# Patient Record
Sex: Male | Born: 1935 | Race: White | Hispanic: No | Marital: Married | Smoking: Former smoker
Health system: Southern US, Community
[De-identification: ages and names within clinical notes are randomized; demographics above are authoritative.]

## PROBLEM LIST (undated history)

## (undated) DIAGNOSIS — J449 Chronic obstructive pulmonary disease, unspecified: Secondary | ICD-10-CM

## (undated) DIAGNOSIS — N400 Enlarged prostate without lower urinary tract symptoms: Secondary | ICD-10-CM

## (undated) DIAGNOSIS — H3530 Unspecified macular degeneration: Secondary | ICD-10-CM

## (undated) DIAGNOSIS — K219 Gastro-esophageal reflux disease without esophagitis: Secondary | ICD-10-CM

## (undated) DIAGNOSIS — J4489 Other specified chronic obstructive pulmonary disease: Secondary | ICD-10-CM

## (undated) DIAGNOSIS — J309 Allergic rhinitis, unspecified: Secondary | ICD-10-CM

## (undated) HISTORY — DX: Chronic obstructive pulmonary disease, unspecified: J44.9

## (undated) HISTORY — DX: Unspecified macular degeneration: H35.30

## (undated) HISTORY — DX: Gastro-esophageal reflux disease without esophagitis: K21.9

## (undated) HISTORY — DX: Benign prostatic hyperplasia without lower urinary tract symptoms: N40.0

## (undated) HISTORY — PX: TURP VAPORIZATION: SUR1397

## (undated) HISTORY — DX: Allergic rhinitis, unspecified: J30.9

## (undated) HISTORY — DX: Other specified chronic obstructive pulmonary disease: J44.89

## (undated) HISTORY — PX: VASECTOMY: SHX75

---

## 2000-01-13 ENCOUNTER — Encounter: Admission: RE | Admit: 2000-01-13 | Discharge: 2000-01-13 | Payer: Self-pay | Admitting: Cardiology

## 2000-01-13 ENCOUNTER — Encounter: Payer: Self-pay | Admitting: Cardiology

## 2000-01-20 ENCOUNTER — Ambulatory Visit (HOSPITAL_COMMUNITY): Admission: RE | Admit: 2000-01-20 | Discharge: 2000-01-20 | Payer: Self-pay | Admitting: Cardiology

## 2002-04-28 ENCOUNTER — Ambulatory Visit (HOSPITAL_BASED_OUTPATIENT_CLINIC_OR_DEPARTMENT_OTHER): Admission: RE | Admit: 2002-04-28 | Discharge: 2002-04-28 | Payer: Self-pay | Admitting: Internal Medicine

## 2002-05-24 ENCOUNTER — Encounter: Payer: Self-pay | Admitting: Internal Medicine

## 2002-05-24 ENCOUNTER — Ambulatory Visit (HOSPITAL_COMMUNITY): Admission: RE | Admit: 2002-05-24 | Discharge: 2002-05-24 | Payer: Self-pay | Admitting: Internal Medicine

## 2004-09-03 ENCOUNTER — Encounter: Admission: RE | Admit: 2004-09-03 | Discharge: 2004-09-03 | Payer: Self-pay | Admitting: Family Medicine

## 2004-09-09 ENCOUNTER — Encounter: Admission: RE | Admit: 2004-09-09 | Discharge: 2004-09-09 | Payer: Self-pay | Admitting: Family Medicine

## 2005-08-18 ENCOUNTER — Ambulatory Visit: Payer: Self-pay | Admitting: Internal Medicine

## 2006-01-13 ENCOUNTER — Ambulatory Visit: Payer: Self-pay | Admitting: Internal Medicine

## 2006-07-15 ENCOUNTER — Ambulatory Visit: Payer: Self-pay | Admitting: Internal Medicine

## 2007-01-13 ENCOUNTER — Ambulatory Visit: Payer: Self-pay | Admitting: Internal Medicine

## 2008-01-12 DIAGNOSIS — K219 Gastro-esophageal reflux disease without esophagitis: Secondary | ICD-10-CM

## 2008-01-12 DIAGNOSIS — J310 Chronic rhinitis: Secondary | ICD-10-CM

## 2008-01-12 DIAGNOSIS — J45909 Unspecified asthma, uncomplicated: Secondary | ICD-10-CM | POA: Insufficient documentation

## 2008-01-13 ENCOUNTER — Ambulatory Visit: Payer: Self-pay | Admitting: Internal Medicine

## 2008-01-13 DIAGNOSIS — Z8719 Personal history of other diseases of the digestive system: Secondary | ICD-10-CM

## 2008-01-16 DIAGNOSIS — J449 Chronic obstructive pulmonary disease, unspecified: Secondary | ICD-10-CM

## 2008-01-16 DIAGNOSIS — J309 Allergic rhinitis, unspecified: Secondary | ICD-10-CM | POA: Insufficient documentation

## 2008-01-19 ENCOUNTER — Ambulatory Visit: Payer: Self-pay | Admitting: Internal Medicine

## 2008-12-19 ENCOUNTER — Encounter: Admission: RE | Admit: 2008-12-19 | Discharge: 2008-12-19 | Payer: Self-pay | Admitting: Family Medicine

## 2009-01-11 ENCOUNTER — Ambulatory Visit: Payer: Self-pay | Admitting: Internal Medicine

## 2009-01-29 ENCOUNTER — Telehealth: Payer: Self-pay | Admitting: Internal Medicine

## 2009-04-12 ENCOUNTER — Ambulatory Visit: Payer: Self-pay | Admitting: Internal Medicine

## 2010-04-04 ENCOUNTER — Inpatient Hospital Stay (HOSPITAL_COMMUNITY): Admission: EM | Admit: 2010-04-04 | Discharge: 2010-04-05 | Payer: Self-pay | Admitting: Emergency Medicine

## 2010-04-12 ENCOUNTER — Telehealth: Payer: Self-pay | Admitting: Internal Medicine

## 2010-04-15 ENCOUNTER — Ambulatory Visit: Payer: Self-pay | Admitting: Internal Medicine

## 2010-05-15 ENCOUNTER — Telehealth: Payer: Self-pay | Admitting: Internal Medicine

## 2010-10-11 ENCOUNTER — Ambulatory Visit: Payer: Self-pay | Admitting: Internal Medicine

## 2010-12-29 ENCOUNTER — Encounter: Payer: Self-pay | Admitting: Family Medicine

## 2011-01-06 ENCOUNTER — Encounter
Admission: RE | Admit: 2011-01-06 | Discharge: 2011-01-06 | Payer: Self-pay | Source: Home / Self Care | Attending: Family Medicine | Admitting: Family Medicine

## 2011-01-07 NOTE — Progress Notes (Signed)
Summary: nos appt  Phone Note Call from Patient   Caller: [name]  Call For: [name] Summary of Call: Rsc nos from 5/5 to 5/9 @ 9:45a. Initial call taken by: [name],  Apr 12, 2010 2:10 PM

## 2011-01-07 NOTE — Progress Notes (Signed)
Summary: rx  Phone Note Call from Patient   Caller: Daughter-Paula Call For: Young Reason for Call: Refill Medication Summary of Call: Advair 100/50 - pt going to mail order needs 90 day supply called in to [facility].  Fax# [phone] Initial call taken by: Eugene Gavia,  May 15, 2010 11:49 AM  Follow-up for Phone Call        refill sent. pt aware.Carron Curie CMA  May 15, 2010 11:54 AM     Prescriptions: ADVAIR DISKUS 100-50 MCG/DOSE MISC (FLUTICASONE-SALMETEROL) 1 puff and rinse, twice daily  #3 x 3   Entered by:   Carron Curie CMA   Authorized by:   Waymon Budge MD   Signed by:   Carron Curie CMA on 05/15/2010   Method used:   Faxed to ...       [facility] (mail-order)       [address]       [place], [place]  [address]       Ph: [phone]       Fax: [phone]   RxID:   [id number] ADVAIR DISKUS 100-50 MCG/DOSE MISC (FLUTICASONE-SALMETEROL) 1 puff and rinse, twice daily  #3 x 3   Entered by:   Carron Curie CMA   Authorized by:   Waymon Budge MD   Signed by:   Carron Curie CMA on 05/15/2010   Method used:   Faxed to ...       [facility] (mail-order)             , [place]         Ph:        Fax: [phone]   RxID:   [id number]

## 2011-01-07 NOTE — Assessment & Plan Note (Signed)
Summary: 1 yr/jd   Primary Provider/Referring Provider:  Silvestre Gunner FP/ Al Little  CC:  Yearly follow up visit.  History of Present Illness: 2008/02/09-  Good  so far this winter with no colds. Meds discussed. Flu shot taken along with shingles shot earlier. No routine bronchodilators. Notices dyspnea on hills and stairs, probably not progressive. Morning cough some clear mucus.Denies blood, admits occ indigestion with no exertional pain. Saw Dr.L ittle years ago for eval of chest pain.  01/11/09- 81 yoM with hx copd, allergic rhinitis, asthma, GERD                     Wife and dtr here Annual f/u/. Reviewed test results from last visit CXR- COPD,NAD PFT- mild obst small airways without response, mod reduction DLCO - 374 meters, nadir 91%.  No longer feels he could walk the distance he could a year ago.Difficult on stairs. Admits he doesn't walk or exercise much any more. Has had several bronchitis episodes this winter and is still coughing. Hx Pneumovax x 2. Sputum is pale green, no blood and no chest pain. Much nasal discharge. Had pneumovax in 1995 and 2005. Wife say his breathing is terrible at night with a rattle in his chest.   04/12/09- Allergic rhinits, hx copd, asthma, GERD     Wife here had cold this winter. Has noted hoarseness intermittently with pollen. Wife says he no longrer snores and labors at night since he got the Advair. They are satisfied overall. Discussed antihistamines  Apr 15, 2010- Allergic rhinitis, hx COPD, asthma, GERD....................Marland Kitchenwife here He blames over-exertion mowing lawn x 4 hours for causing fever. He went to ER and was hopitalised for a few days with pneumonia.  While in hosp they found a tick, but covered by the antibiotics he was given- discharged on Avelox. Not diagnosed RMSF, and he never noted a rash. Breathing is back to his baseline and he is using Advair once daily.     Current Medications (verified): 1)  Multivitamin .... Take 1 Tablet  By Mouth Once A Day 2)  Cardura 4 Mg  Tabs (Doxazosin Mesylate) .... Take 1 Tablet By Mouth Once A Day 3)  Mucinex .... Use As Needed 4)  Tylenol .... Prn 5)  Protonix 40 Mg Tbec (Pantoprazole Sodium) .... Take 1 By Mouth Once Daily 6)  I Cap Vitamin .... Take 1 Tablet By Mouth Twice Daily 7)  Chelated Zinc 50 Mg Tabs (Zinc) .... Take 1/2 By Mouth Once Daily 8)  Vitamin C 500 Mg Tabs (Ascorbic Acid) .... Take 1 By Mouth Once Daily 9)  Advair Diskus 100-50 Mcg/dose Misc (Fluticasone-Salmeterol) .Marland Kitchen.. 1 Puff and Rinse, Twice Daily 10)  Simvastatin 10 Mg Tabs (Simvastatin) .... Take 1/2 By Mouth Once Daily  Allergies (verified): No Known Drug Allergies  Past History:  Past Medical History: Last updated: 02/09/08 Allergic Rhinitis Asthma C O P D G E R D BPH  Past Surgical History: Last updated: 04/12/2009 TURP Vasectomy  Family History: Last updated: February 09, 2008 Parents died of cancer quite elderly  Social History: Last updated: 02/09/08 Patient states former smoker.  Worked Designer, fashion/clothing, farming, insurance  Risk Factors: Smoking Status: quit (02-09-2008)  Review of Systems      See HPI  The patient denies anorexia, fever, weight loss, weight gain, vision loss, decreased hearing, hoarseness, chest pain, syncope, dyspnea on exertion, peripheral edema, prolonged cough, headaches, hemoptysis, abdominal pain, and severe indigestion/heartburn.    Vital Signs:  Patient profile:  77 year old male Height:      66 inches Weight:      147 pounds BMI:     23.81 O2 Sat:      96 % on Room air Pulse rate:   77 / minute BP sitting:   126 / 72  (left arm) Cuff size:   regular  Vitals Entered By: Reynaldo Minium CMA (Apr 15, 2010 9:39 AM)  O2 Flow:  Room air  Physical Exam  Additional Exam:  General: A/Ox3; pleasant and cooperative, NAD, hard of hearing, room air O2 sat 96% SKIN: no rash, lesions NODES: no lymphadenopathy HEENT: [place]/AT, EOM- WNL, Conjunctivae- clear, PERRLA,  TM-hearing aid, Nose- clear, Throat- clear and wnl NECK: Supple w/ fair ROM, JVD- none, normal carotid impulses w/o bruits Thyroid-  CHEST: Distant, unlabored on room air.  HEART: RRR, no m/g/r heard ABDOMEN NWG:NFAO, nl pulses, no edema  NEURO: Grossly intact to observation      Impression & Recommendations:  Problem # 1:  C O P D (ICD-496) He is at baseline now after a pneumonia last month. He uses Advair once daily which seems to be sufficient.  Problem # 2:  ALLERGIC RHINITIS (ICD-477.9) Controlled without acute problem this spring.  Medications Added to Medication List This Visit: 1)  Protonix 40 Mg Tbec (Pantoprazole sodium) .... Take 1 by mouth once daily 2)  Simvastatin 10 Mg Tabs (Simvastatin) .... Take 1/2 by mouth once daily  Other Orders: Est. Patient Level III (13086)  Patient Instructions: 1)  Please schedule a follow-up appointment in 6 months. 2)  Continue present meds- call as needed

## 2011-01-07 NOTE — Assessment & Plan Note (Signed)
Summary: 6 months/ mbw   Primary Provider/Referring Provider:  Silvestre Gunner FP/ Al Little  CC:  6 month-COPD .  History of Present Illness:  04/12/09- Allergic rhinits, hx copd, asthma, GERD     Wife here had cold this winter. Has noted hoarseness intermittently with pollen. Wife says he no longrer snores and labors at night since he got the Advair. They are satisfied overall. Discussed antihistamines  Apr 15, 2010- Allergic rhinitis, hx COPD, asthma, GERD....................Marland Kitchenwife here He blames over-exertion mowing lawn x 4 hours for causing fever. He went to ER and was hopitalised for a few days with pneumonia.  While in hosp they found a tick, but covered by the antibiotics he was given- discharged on Avelox. Not diagnosed RMSF, and he never noted a rash. Breathing is back to his baseline and he is using Advair once daily.  October 11, 2010- Allergic rhinitis, hx COPD, asthma, GERD....................Marland Kitchenwife here Nurse-CC: 6 month-COPD  Had another pneumonia, this time treated at home- just fininshing Avelox and benzonatate. Otherwise he has maintained well with Advair. He has GERD at times and we discussed aspiration and other common pneumonia patterns. Cost of Advair is a concern and we discused alternatives. He rarely notes wheeze.     Asthma History    Initial Asthma Severity Rating:    Age range: 12+ years    Symptoms: 0-2 days/week    Nighttime Awakenings: 0-2/month    Interferes w/ normal activity: no limitations    SABA use (not for EIB): 0-2 days/week    Asthma Severity Assessment: Intermittent   Preventive Screening-Counseling & Management  Alcohol-Tobacco     Smoking Status: quit     Year Quit: 1993     Pack years: 30 yrs  2 pack daily  Current Medications (verified): 1)  Multivitamin .... Take 1 Tablet By Mouth Once A Day 2)  Cardura 4 Mg  Tabs (Doxazosin Mesylate) .... Take 1 Tablet By Mouth Once A Day 3)  Mucinex .... Use As Needed 4)  Tylenol .... Prn 5)   Protonix 40 Mg Tbec (Pantoprazole Sodium) .... Take 1 By Mouth Once Daily 6)  I Cap Vitamin .... Take 1 Tablet By Mouth Twice Daily 7)  Chelated Zinc 50 Mg Tabs (Zinc) .... Take 1/2 By Mouth Once Daily 8)  Vitamin C 500 Mg Tabs (Ascorbic Acid) .... Take 1 By Mouth Once Daily 9)  Advair Diskus 100-50 Mcg/dose Misc (Fluticasone-Salmeterol) .Marland Kitchen.. 1 Puff and Rinse, Twice Daily 10)  Vitamin B-12 1000 Mcg Tabs (Cyanocobalamin) .... Take 1 By Mouth Once Daily  Allergies (verified): No Known Drug Allergies  Past History:  Past Medical History: Last updated: 2008/01/19 Allergic Rhinitis Asthma C O P D G E R D BPH  Past Surgical History: Last updated: 04/12/2009 TURP Vasectomy  Family History: Last updated: 01/19/08 Parents died of cancer quite elderly  Social History: Last updated: 01-19-2008 Patient states former smoker.  Worked Designer, fashion/clothing, farming, insurance  Risk Factors: Smoking Status: quit (10/11/2010)  Review of Systems      See HPI       The patient complains of shortness of breath with activity.  The patient denies shortness of breath at rest, productive cough, non-productive cough, coughing up blood, chest pain, irregular heartbeats, indigestion, loss of appetite, weight change, abdominal pain, difficulty swallowing, sore throat, tooth/dental problems, headaches, nasal congestion/difficulty breathing through nose, and sneezing.    Vital Signs:  Patient profile:   77 year old male Height:      25  inches Weight:      148.38 pounds BMI:     24.04 O2 Sat:      91 % on Room air Pulse rate:   72 / minute BP sitting:   128 / 70  (left arm) Cuff size:   regular  Vitals Entered By: Reynaldo Minium CMA (October 11, 2010 9:16 AM)  O2 Flow:  Room air CC: 6 month-COPD    Physical Exam  Additional Exam:  General: A/Ox3; pleasant and cooperative, NAD, hard of hearing, room air O2 sat 91% SKIN: no rash, lesions NODES: no lymphadenopathy HEENT: [place]/AT, EOM- WNL,  Conjunctivae- clear, PERRLA, TM-hearing aid, Nose- clear, Throat- clear and wnl NECK: Supple w/ fair ROM, JVD- none, normal carotid impulses w/o bruits Thyroid-  CHEST: Distant, unlabored on room air.  HEART: RRR, no m/g/r heard ABDOMEN not obese WGN:FAOZ, nl pulses, no edema  NEURO: Grossly intact to observation      Impression & Recommendations:  Problem # 1:  ASTHMA (ICD-493.90) Asthma control has been good. In order to assess cost effectiveness, we will give scripts for a rescue inhaler and steroid inhaler. They will take these to the drug store for price check against Advair.  He has had more than one pneumovax and doesn't need to repeat that. Had flu vax.   Medications Added to Medication List This Visit: 1)  Vitamin B-12 1000 Mcg Tabs (Cyanocobalamin) .... Take 1 by mouth once daily 2)  Qvar 40 Mcg/act Aers (Beclomethasone dipropionate) .... 2 puffs and rinse mouth, twice daily- steroid maintenance inhaler 3)  Proair Hfa 108 (90 Base) Mcg/act Aers (Albuterol sulfate) .... 2 puffs four times a day as needed rescue inhaler  Other Orders: Est. Patient Level IV (30865)  Patient Instructions: 1)  Please schedule a follow-up appointment in 6 months. 2)  Compare price- Use EITHER 3)   Advair 100/5 as a combination inhaler with 2 drugs in it..... 4)  OR 5)  Use Qvar 40, 2 puffs then rinse mouth, twice every day 6)  AND 7)   use Proair rescue albuterol inhaler when chest tight, short of breath, wheezey      only when needed 8)    2 puffs up to 4 times daily. Prescriptions: ADVAIR DISKUS 100-50 MCG/DOSE MISC (FLUTICASONE-SALMETEROL) 1 puff and rinse, twice daily  #3 x 3   Entered and Authorized by:   Waymon Budge MD   Signed by:   Waymon Budge MD on 10/11/2010   Method used:   Print then Give to Patient   RxID:   [id number] PROAIR HFA 108 (90 BASE) MCG/ACT AERS (ALBUTEROL SULFATE) 2 puffs four times a day as needed rescue inhaler  #1 x prn   Entered and Authorized  by:   Waymon Budge MD   Signed by:   Waymon Budge MD on 10/11/2010   Method used:   Print then Give to Patient   RxID:   [id number] QVAR 40 MCG/ACT AERS (BECLOMETHASONE DIPROPIONATE) 2 puffs and rinse mouth, twice daily- steroid maintenance inhaler  #1 x prn   Entered and Authorized by:   Waymon Budge MD   Signed by:   Waymon Budge MD on 10/11/2010   Method used:   Print then Give to Patient   RxID:   [id number]

## 2011-02-25 LAB — COMPREHENSIVE METABOLIC PANEL
Albumin: 3.4 g/dL — ABNORMAL LOW (ref 3.5–5.2)
Alkaline Phosphatase: 69 U/L (ref 39–117)
BUN: 13 mg/dL (ref 6–23)
Calcium: 8.5 mg/dL (ref 8.4–10.5)
Creatinine, Ser: 0.93 mg/dL (ref 0.4–1.5)
Glucose, Bld: 134 mg/dL — ABNORMAL HIGH (ref 70–99)
Sodium: 138 mEq/L (ref 135–145)
Total Protein: 6.3 g/dL (ref 6.0–8.3)

## 2011-02-25 LAB — DIFFERENTIAL
Basophils Absolute: 0 10*3/uL (ref 0.0–0.1)
Basophils Absolute: 0.1 10*3/uL (ref 0.0–0.1)
Eosinophils Relative: 2 % (ref 0–5)
Monocytes Absolute: 0.8 10*3/uL (ref 0.1–1.0)
Monocytes Relative: 4 % (ref 3–12)
Monocytes Relative: 6 % (ref 3–12)
Neutro Abs: 17 10*3/uL — ABNORMAL HIGH (ref 1.7–7.7)

## 2011-02-25 LAB — CBC
HCT: 41.2 % (ref 39.0–52.0)
HCT: 41.4 % (ref 39.0–52.0)
HCT: 46 % (ref 39.0–52.0)
Hemoglobin: 14.1 g/dL (ref 13.0–17.0)
Hemoglobin: 14.4 g/dL (ref 13.0–17.0)
Hemoglobin: 15.4 g/dL (ref 13.0–17.0)
MCHC: 34.7 g/dL (ref 30.0–36.0)
Platelets: 159 10*3/uL (ref 150–400)
RBC: 4.48 MIL/uL (ref 4.22–5.81)
RDW: 13.5 % (ref 11.5–15.5)
WBC: 19 10*3/uL — ABNORMAL HIGH (ref 4.0–10.5)

## 2011-02-25 LAB — HEMOGLOBIN A1C
Hgb A1c MFr Bld: 6.2 % — ABNORMAL HIGH (ref <5.7)
Mean Plasma Glucose: 131 mg/dL — ABNORMAL HIGH (ref <117)

## 2011-02-25 LAB — LIPID PANEL
Cholesterol: 144 mg/dL (ref 0–200)
HDL: 47 mg/dL (ref >39)
LDL Cholesterol: 87 mg/dL (ref 0–99)
Total CHOL/HDL Ratio: 3.1 RATIO
Triglycerides: 51 mg/dL (ref <150)

## 2011-02-25 LAB — URINE CULTURE

## 2011-02-25 LAB — URINALYSIS, ROUTINE W REFLEX MICROSCOPIC
Bilirubin Urine: NEGATIVE
Hgb urine dipstick: NEGATIVE
Protein, ur: NEGATIVE mg/dL
Specific Gravity, Urine: 1.02 (ref 1.005–1.030)
Urobilinogen, UA: 0.2 mg/dL (ref 0.0–1.0)

## 2011-02-25 LAB — TSH: TSH: 0.777 u[IU]/mL (ref 0.350–4.500)

## 2011-02-25 LAB — CULTURE, BLOOD (ROUTINE X 2)

## 2011-02-25 LAB — POCT I-STAT, CHEM 8
BUN: 15 mg/dL (ref 6–23)
Glucose, Bld: 130 mg/dL — ABNORMAL HIGH (ref 70–99)
HCT: 48 % (ref 39.0–52.0)

## 2011-04-07 ENCOUNTER — Encounter: Payer: Self-pay | Admitting: Internal Medicine

## 2011-04-09 ENCOUNTER — Ambulatory Visit (INDEPENDENT_AMBULATORY_CARE_PROVIDER_SITE_OTHER): Payer: Medicare Other | Admitting: Internal Medicine

## 2011-04-09 ENCOUNTER — Encounter: Payer: Self-pay | Admitting: Internal Medicine

## 2011-04-09 VITALS — BP 126/74 | HR 75 | Ht 66.0 in | Wt 152.4 lb

## 2011-04-09 DIAGNOSIS — J984 Other disorders of lung: Secondary | ICD-10-CM

## 2011-04-09 DIAGNOSIS — J309 Allergic rhinitis, unspecified: Secondary | ICD-10-CM

## 2011-04-09 DIAGNOSIS — J4489 Other specified chronic obstructive pulmonary disease: Secondary | ICD-10-CM

## 2011-04-09 DIAGNOSIS — J449 Chronic obstructive pulmonary disease, unspecified: Secondary | ICD-10-CM

## 2011-04-09 DIAGNOSIS — R911 Solitary pulmonary nodule: Secondary | ICD-10-CM

## 2011-04-09 NOTE — Assessment & Plan Note (Signed)
Very stable and not limited. Since he is comfortable with Advair and not using Qvar, I will take Qvar off his list.

## 2011-04-09 NOTE — Assessment & Plan Note (Addendum)
Good control. Some seasonal variability but not the severity of several years ago.

## 2011-04-09 NOTE — Patient Instructions (Signed)
Ok to continue Advair once daily, moving back up to twice daily any time you have wheeze or chest tightness  Please call as needed

## 2011-04-09 NOTE — Assessment & Plan Note (Addendum)
For 1 year f/y by Dr Ludwig Clarks. I can help if needed.

## 2011-04-09 NOTE — Progress Notes (Signed)
  Subjective:    Patient ID: [name], male    DOB: 1926-12-09, 77 y.o.   MRN: [id number]  HPI 55 yoM former smoker here with his wife for f/u of asthma and allergic rhinitis, complicated by COPD and GERD. Last here October 11, 2010- note reviewed. His memory is failing. No major problems since last here.  Has noted a little morning sneeze. Coughs a little at night. Throat clearing. Tries mucinex. Protonix has markedly improved his heartburn. He he has his HOB elevated and avoids steeply reclining his chair. He denies significant cough, wheeze or shortness of breath.  CT chest reviewed from January, 2012- tiny LLL nodule for f/u by Dr Ludwig Clarks in 1 year, COPD, CAD. Uses Advair once daily, sometimes twice. Qvar is listed, but not used.    Review of Systems See HPI Constitutional:   No weight loss, night sweats,  Fevers, chills, fatigue, lassitude. HEENT:   No headaches,  Difficulty swallowing,  Tooth/dental problems,  Sore throat,                No sneezing, itching, ear ache, nasal congestion, post nasal drip,   CV:  No chest pain,  Orthopnea, PND, swelling in lower extremities, anasarca, dizziness, palpitations  GI  No- abdominal pain, nausea, vomiting, diarrhea, change in bowel habits, loss of appetite  Resp: No shortness of breath with exertion or at rest.  No excess mucus, no productive cough,  No non-productive cough,  No coughing up of blood.  No change in color- of mucus.  No wheezing.  No chest wall deformit Skin: no rash or lesions.  GU: no dysuria, change in color of urine, no urgency or frequency.  No flank pain.  MS:  No joint pain or swelling.  No decreased range of motion.  No back pain.  Psych:  No change in mood or affect. No depression or anxiety.  No memory loss.      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 Hearing aid  Nose- Clear,  No- Septal dev, mucus, polyps, erosion, perforation   Throat- Mallampati II , mucosa clear , drainage- none, tonsils- atrophic  Neck- flexible , trachea midline, no stridor , thyroid nl, carotid no bruit  Chest - symmetrical excursion , unlabored     Heart/CV- RRR , no murmur , no gallop  , no rub, nl s1 s2                     - JVD- none , edema- none, stasis changes- none, varices- none     Lung- clear to P&A, wheeze- none, cough- none , dullness-none, rub- none     Chest wall-   Abd- tender-no, distended-no, bowel sounds-present, HSM- no  Br/ Gen/ Rectal- Not done, not indicated  Extrem- cyanosis- none, clubbing, none, atrophy- none, strength- nl  Neuro- grossly intact to observation         Assessment & Plan:

## 2011-04-13 ENCOUNTER — Encounter: Payer: Self-pay | Admitting: Internal Medicine

## 2011-04-25 NOTE — Assessment & Plan Note (Signed)
[facility]                             PULMONARY OFFICE NOTE   NAME:[name], [name]                        MRN:          [id number]  DATE:01/13/2007                            DOB:          [birth year]    PROBLEMS:  1. Rhinitis.  2. Asthma/bronchitis.  3. Esophageal reflux/hiatal hernia.   HISTORY:  Six month followup. He has felt stable with little phlegm. He  shook off a minor head cold with use of Mucinex. He had a chest x-ray at  Dr. Kathi Der office a few months and has not heard that there was any  problem. He has pneumococcal vaccine twice. His wife is noticing that he  snores and is going to pay attention to that in case we need to arrange  a sleep study.   MEDICATIONS:  1. Multivitamin.  2. Aspirin every other day.  3. Flaxseed oil.  4. Icaps.  5. Cardura 4 mg.  6. Mucinex and Tylenol p.r.n. use.   ALLERGIES:  No medication allergy.   OBJECTIVE:  Weight 155 pounds, blood pressure 120/80, pulse 74, room air  saturation 94%. Quiet chest. He seems alert, but hard of hearing. Heart  sounds are regular without murmur. There is no nasal congestion or post-  nasal drip. No neck vein distension or adenopathy.   LABORATORY DATA:  Stable. Our chest x-ray in February 2007, showed  chronic obstructive pulmonary disease with no active disease. A sleep  study in 2003 had shown an index of 4 per hour which is within normal  limits. Pulmonary function test with office spirometry in 2005 had shown  minimal obstructive airways disease with an FEV1 78-81% of predicted and  formal testing showing slowing mainly in small airways.   IMPRESSION:  Stable, very mild chronic obstructive pulmonary  disease/asthma, no changes necessary.   PLAN:  Schedule return 1 year, earlier p.r.n.     Clinton D. Maple Hudson, MD, [facility], FACP  Electronically Signed    CDY/MedQ  DD: 01/13/2007  DT: 01/14/2007  Job #: [id number]   cc:   [name], [name]  Thereasa Solo. Little,  [name]

## 2011-04-25 NOTE — Assessment & Plan Note (Signed)
[facility]                               PULMONARY OFFICE NOTE   NAME:KNIGHTOlivia, [name]                        MRN:          [id number]  DATE:07/15/2006                            DOB:          12/07/1927    PROBLEMS:  1.  Rhinitis.  2.  Asthma/bronchitis.  3.  Esophageal reflux.   HISTORY:  [name] brings [name] with him on return today saying that he  feels okay.  [name] challenges this, saying that he tends to cough when he  lays down even in Evan recliner chair.  He is aware that he has at least some  reflux, although I am not sure they can make a clear connection between the  cough and actual heartburn events.  We discussed this.  He is not having  chest pain, significant sputum, anything purulent or bloody, or any sinus  drainage.  He has been using Nexium or Prelief intermittently.   MEDICATIONS:  1.  Afrin every other day.  2.  Flax seed oil.  3.  Over-the-counter eye drop.  4.  Mucinex.  5.  Tylenol.   ALLERGIES:  No known drug allergies.   OBJECTIVE:  VITAL SIGNS:  Weight 153 pounds, blood pressure 134/76, pulse  regular at 72, room air saturation 92%.  CHEST:  Quiet.  Breath sounds are diminished.  Expiratory phase is a little  slow, but unlabored.  HEART:  Regular without murmur or gallop.  NECK:  There is no neck vein distention or stridor.  HEENT:  No visible nasal drainage.  Evan pharynx is not red or irritated.  EXTREMITIES:  There is no peripheral edema.   LABORATORY DATA:  Chest x-ray:  Film from January 13, 2006, showed COPD with  scarring, no acute process, no active disease.  PFT:  Last spirometry at  [facility] on January 25, 2004, showed mild obstruction,  mainly suggested by a ratio FVC to FEV1 which was 0.59.  FVC measured 4100  (106% predicted).  FEV1 measured 2400 (81% predicted).  He is a former  smoker.   IMPRESSION:  1.  Asthma, probably with a mild component of chronic obstructive  pulmonary      disease.  I suspect that with more formal pulmonary function testing we      will be able to recognize a fixed obstructive component, but it is      likely to be small.  2.  Probable reflux.  3.  Cough related both to Evan asthma and reflux.  Of these, most important      factor currently is reflux, and if we can control that he may not need      much other therapy.   PLAN:  I have asked him to use Prilosec on a daily basis for one month,  follow reflux precautions, keep head of bed elevated, and call me with  results.  We will schedule him to see him in six months, but earlier p.r.n.  Clinton D. Maple Hudson, MD, [facility], FACP   CDY/MedQ  DD:  07/15/2006  DT:  07/15/2006  Job #:  [id number]   cc:   Evan Penna, MD

## 2011-10-16 ENCOUNTER — Ambulatory Visit: Payer: Medicare Other | Admitting: Internal Medicine

## 2011-11-03 ENCOUNTER — Other Ambulatory Visit: Payer: Self-pay | Admitting: Internal Medicine

## 2011-11-20 ENCOUNTER — Ambulatory Visit: Payer: Medicare Other | Admitting: Internal Medicine

## 2011-12-26 ENCOUNTER — Ambulatory Visit: Payer: Medicare Other | Admitting: Internal Medicine

## 2011-12-29 ENCOUNTER — Telehealth: Payer: Self-pay | Admitting: Internal Medicine

## 2011-12-29 MED ORDER — FLUTICASONE-SALMETEROL 100-50 MCG/DOSE IN AEPB: 1.0000 | INHALATION_SPRAY | Freq: Two times a day (BID) | RESPIRATORY_TRACT | Status: DC

## 2011-12-29 NOTE — Telephone Encounter (Signed)
Spoke with pt's wife an they have an appt in feb an requesting  1 fill on the advair sent over to pharmacy the patient isa ware

## 2012-01-22 ENCOUNTER — Encounter: Payer: Self-pay | Admitting: Internal Medicine

## 2012-01-22 ENCOUNTER — Ambulatory Visit (INDEPENDENT_AMBULATORY_CARE_PROVIDER_SITE_OTHER): Payer: Medicare Other | Admitting: Internal Medicine

## 2012-01-22 ENCOUNTER — Ambulatory Visit (INDEPENDENT_AMBULATORY_CARE_PROVIDER_SITE_OTHER)
Admission: RE | Admit: 2012-01-22 | Discharge: 2012-01-22 | Disposition: A | Discharge status: Home / Self Care | Payer: Medicare Other | Source: Ambulatory Visit | Attending: Internal Medicine | Admitting: Internal Medicine

## 2012-01-22 VITALS — BP 122/78 | HR 78 | Ht 66.0 in | Wt 154.8 lb

## 2012-01-22 DIAGNOSIS — J309 Allergic rhinitis, unspecified: Secondary | ICD-10-CM

## 2012-01-22 DIAGNOSIS — R911 Solitary pulmonary nodule: Secondary | ICD-10-CM

## 2012-01-22 DIAGNOSIS — K219 Gastro-esophageal reflux disease without esophagitis: Secondary | ICD-10-CM

## 2012-01-22 DIAGNOSIS — J449 Chronic obstructive pulmonary disease, unspecified: Secondary | ICD-10-CM

## 2012-01-22 MED ORDER — FLUTICASONE-SALMETEROL 100-50 MCG/DOSE IN AEPB: 1.0000 | INHALATION_SPRAY | Freq: Two times a day (BID) | RESPIRATORY_TRACT | Status: DC

## 2012-01-22 MED ORDER — ALBUTEROL SULFATE HFA 108 (90 BASE) MCG/ACT IN AERS: 2.0000 | INHALATION_SPRAY | Freq: Four times a day (QID) | RESPIRATORY_TRACT | Status: DC | PRN

## 2012-01-22 NOTE — Progress Notes (Signed)
Patient ID: [name], male    DOB: 12/25/1926, 78 y.o.   MRN: [id number] 10/11/10-HPI 7 yoM former smoker here with his wife for f/u of asthma and allergic rhinitis, complicated by COPD and GERD. Last here October 11, 2010- note reviewed. His memory is failing. No major problems since last here.  Has noted a little morning sneeze. Coughs a little at night. Throat clearing. Tries mucinex. Protonix has markedly improved his heartburn. He he has his HOB elevated and avoids steeply reclining his chair. He denies significant cough, wheeze or shortness of breath.  CT chest reviewed from January, 2012- tiny LLL nodule for f/u by Dr Ludwig Clarks in 1 year, COPD, CAD. Uses Advair once daily, sometimes twice. Qvar is listed, but not used.  CT 01/06/11- tiny LLL nodule, emphysema, CAD/ coronary calcification.   01/22/12- 83 yoM former smoker here with his wife for f/u of asthma and allergic rhinitis, complicated by COPD and GERD. Here with wife today. They both had flu shots. He has not had a significant respiratory infection over the last year. Not aware of shortness of breath. His wife pays more tension than he does. He only admits to cough if he eats chocolate. Occasional food hang up if he doesn't chew well but this is not painful or progressive. Denies obvious reflux or any impact on his breathing.  ROS-see HPI Constitutional:   No-   weight loss, night sweats, fevers, chills, fatigue, lassitude. HEENT:   No-  headaches, difficulty swallowing, tooth/dental problems, sore throat,       No-  sneezing, itching, ear ache, nasal congestion, post nasal drip,  CV:  No-   chest pain, orthopnea, PND, swelling in lower extremities, anasarca, dizziness, palpitations Resp: No- acute  shortness of breath with exertion or at rest.              No-   productive cough,  No non-productive cough,  No- coughing up of blood.              No-   change in color of mucus.  No- wheezing.   Skin: No-   rash or lesions. GI:   No-   heartburn, indigestion, abdominal pain, nausea, vomiting, diarrhea,                 change in bowel habits, loss of appetite GU: MS:  No-   joint pain or swelling.  No- decreased range of motion.  No- back pain. Neuro-     nothing unusual Psych:  No- change in mood or affect. No depression or anxiety.  No memory loss.     Objective:  OBJ- Physical Exam General- Alert, Oriented, Affect-appropriate, Distress- none acute Skin- rash-none, lesions- none, excoriation- none Lymphadenopathy- none Head- atraumatic            Eyes- Gross vision intact, PERRLA, conjunctivae and secretions clear            Ears- Hearing, canals-normal, hard of hearing            Nose- Clear, no-Septal dev, mucus, polyps, erosion, perforation             Throat- Mallampati II , mucosa clear , drainage- none, tonsils- atrophic Neck- flexible , trachea midline, no stridor , thyroid nl, carotid no bruit Chest - symmetrical excursion , unlabored           Heart/CV- RRR , no murmur , no gallop  , no rub, nl s1 s2                           -  JVD- none , edema- none, stasis changes- none, varices- none           Lung- clear to P&A, wheeze- none, cough- none , dullness-none, rub- none           Chest wall-  Abd- Br/ Gen/ Rectal- Not done, not indicated Extrem- cyanosis- none, clubbing, none, atrophy- none, strength- nl Neuro- grossly intact to observation

## 2012-01-22 NOTE — Patient Instructions (Signed)
Refilled scripts for rescue inhaler and for Advair  Please call as needed  Order- CXR-  COPD, lung nodule

## 2012-01-23 NOTE — Progress Notes (Signed)
Quick Note:  Called pt's home number - [email or web address] ______ 

## 2012-01-25 NOTE — Assessment & Plan Note (Signed)
Currently asymptomatic. We discussed OTC management as the spring pollen season arrives.

## 2012-01-25 NOTE — Assessment & Plan Note (Signed)
Low concern. We can follow with chest x-ray for routine surveillance.

## 2012-01-25 NOTE — Assessment & Plan Note (Signed)
Reviewed basic reflux precautions and asked him to report if swallowing or food hanging up became more obvious.

## 2012-01-25 NOTE — Assessment & Plan Note (Signed)
Not very symptomatic. Emphysema demonstrated on CT scan. Anticipate office spirometry

## 2012-01-26 ENCOUNTER — Telehealth: Payer: Self-pay | Admitting: Internal Medicine

## 2012-01-26 NOTE — Telephone Encounter (Signed)
Spoke with pt's spouse Dewayne Hatch and notified of results per CDY regarding cxr. She verbalized understanding and states nothing further needed.

## 2012-01-26 NOTE — Progress Notes (Signed)
Quick Note:  Spoke with pt's spouse and notified of results per Dr. Maple Hudson. Pt verbalized understanding and denied any questions.  ______

## 2012-01-26 NOTE — Telephone Encounter (Signed)
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  Antionette Fairy

## 2012-03-10 ENCOUNTER — Other Ambulatory Visit: Payer: Self-pay | Admitting: Internal Medicine

## 2013-01-21 ENCOUNTER — Ambulatory Visit: Payer: Medicare Other | Admitting: Internal Medicine

## 2013-03-02 ENCOUNTER — Ambulatory Visit (INDEPENDENT_AMBULATORY_CARE_PROVIDER_SITE_OTHER)
Admission: RE | Admit: 2013-03-02 | Discharge: 2013-03-02 | Disposition: A | Discharge status: Home / Self Care | Payer: No Typology Code available for payment source | Source: Ambulatory Visit | Attending: Internal Medicine | Admitting: Internal Medicine

## 2013-03-02 ENCOUNTER — Ambulatory Visit (INDEPENDENT_AMBULATORY_CARE_PROVIDER_SITE_OTHER): Payer: No Typology Code available for payment source | Admitting: Internal Medicine

## 2013-03-02 ENCOUNTER — Encounter: Payer: Self-pay | Admitting: Internal Medicine

## 2013-03-02 VITALS — BP 122/78 | HR 69 | Ht 66.0 in | Wt 154.4 lb

## 2013-03-02 DIAGNOSIS — J4489 Other specified chronic obstructive pulmonary disease: Secondary | ICD-10-CM

## 2013-03-02 DIAGNOSIS — J449 Chronic obstructive pulmonary disease, unspecified: Secondary | ICD-10-CM

## 2013-03-02 DIAGNOSIS — R911 Solitary pulmonary nodule: Secondary | ICD-10-CM

## 2013-03-02 MED ORDER — COMPRESSOR/NEBULIZER MISC: Status: DC

## 2013-03-02 MED ORDER — ALBUTEROL SULFATE (2.5 MG/3ML) 0.083% IN NEBU: 2.5000 mg | INHALATION_SOLUTION | Freq: Four times a day (QID) | RESPIRATORY_TRACT | Status: DC | PRN

## 2013-03-02 NOTE — Patient Instructions (Addendum)
Order- [facility] establish DME for nebulizer/ albuterol    Scripts printed  Try using the nebulizer with albuterol up to 4 times daily as needed, instead of Advair.   Order- CXR   Dx COPD  Please call as needed

## 2013-03-02 NOTE — Progress Notes (Signed)
Patient ID: [name], male    DOB: 11-Jul-1927, 79 y.o.   MRN: [id number] 10/11/10-HPI 43 yoM former smoker here with his wife for f/u of asthma and allergic rhinitis, complicated by COPD and GERD. Last here October 11, 2010- note reviewed. His memory is failing. No major problems since last here.  Has noted a little morning sneeze. Coughs a little at night. Throat clearing. Tries mucinex. Protonix has markedly improved his heartburn. He he has his HOB elevated and avoids steeply reclining his chair. He denies significant cough, wheeze or shortness of breath.  CT chest reviewed from January, 2012- tiny LLL nodule for f/u by Dr Ludwig Clarks in 1 year, COPD, CAD. Uses Advair once daily, sometimes twice. Qvar is listed, but not used.  CT 01/06/11- tiny LLL nodule, emphysema, CAD/ coronary calcification.   01/22/12- 40 yoM former smoker here with his wife for f/u of asthma and allergic rhinitis, complicated by COPD and GERD. Here with wife today. They both had flu shots. He has not had a significant respiratory infection over the last year. Not aware of shortness of breath. His wife pays more tension than he does. He only admits to cough if he eats chocolate. Occasional food hang up if he doesn't chew well but this is not painful or progressive. Denies obvious reflux or any impact on his breathing.  03/02/13- 84 yoM former smoker here with his wife for f/u of asthma and allergic rhinitis, complicated by COPD and GERD. FOLLOWS FOR: states breathing is doing good unless getting in rush.  Wife states patient stops breathing at night-doesnt happen often. Needs to have Advair changed to something cheaper-would like to look at using nebulizer instead He denies any dyspnea on exertion but his wife insists that he gives out of breath walking a short distance outside their front door and implies memory isn't very good. Had chest x-ray by his primary physician/Dr. Andrey Campanile one month ago after antibiotics for  bronchitis. He was told there was a "spot". A 3 mm left lower lobe nodule had first been mentioned on CT chest January 2012. CXR 01/26/12 IMPRESSION:  1. Grossly stable findings of advanced emphysematous change  without definite superimposed acute cardiopulmonary disease.  2. Previously identified 3 mm nodule within the left lower lobe is  not well evaluated on plain film radiography. Further evaluation  with a chest CT may be performed as clinically indicated.  Original Report Authenticated By: Waynard Reeds, [name]  ROS-see HPI Constitutional:   No-   weight loss, night sweats, fevers, chills, fatigue, lassitude. HEENT:   No-  headaches, difficulty swallowing, tooth/dental problems, sore throat,       No-  sneezing, itching, ear ache, nasal congestion, post nasal drip,  CV:  No-   chest pain, orthopnea, PND, swelling in lower extremities, anasarca, dizziness, palpitations Resp: No- acute  shortness of breath with exertion or at rest.              No-   productive cough,  No non-productive cough,  No- coughing up of blood.              No-   change in color of mucus.  No- wheezing.   Skin: No-   rash or lesions. GI:  No-   heartburn, indigestion, abdominal pain, nausea, vomiting,  GU: MS:  No-   joint pain or swelling.   Neuro-     nothing unusual Psych:  No- change in mood or affect. No  depression or anxiety.  No memory loss.  Objective:  OBJ- Physical Exam General- Alert, Oriented, Affect-appropriate, Distress- none acute Skin- +bandage right face (skin cancer excised( Lymphadenopathy- none Head- atraumatic            Eyes- Gross vision intact, PERRLA, conjunctivae and secretions clear            Ears- Hearing, canals-normal, hard of hearing            Nose- Clear, no-Septal dev, mucus, polyps, erosion, perforation             Throat- Mallampati II , mucosa clear , drainage- none, tonsils- atrophic Neck- flexible , trachea midline, no stridor , thyroid nl, carotid no  bruit Chest - symmetrical excursion , unlabored           Heart/CV- RRR , no murmur , no gallop  , no rub, nl s1 s2                           - JVD- none , edema- none, stasis changes- none, varices- none           Lung- +rattle left base, wheeze- none, cough- none , dullness-none, rub- none           Chest wall-  Abd- Br/ Gen/ Rectal- Not done, not indicated Extrem- cyanosis- none, clubbing, none, atrophy- none, strength- nl Neuro- grossly intact to observation

## 2013-03-06 NOTE — Assessment & Plan Note (Signed)
This nodule has been present at least since 2012 without apparent change as of February 2013. Plan-chest x-ray

## 2013-03-06 NOTE — Assessment & Plan Note (Signed)
Mild bronchitis after an acute bronchitis appropriately treated one month ago by his primary physician. He and his wife are concerned about the cost of Advair. They ask about changing to a home nebulizer. Plan-home nebulizer with albuterol, CXR

## 2013-05-04 ENCOUNTER — Telehealth: Payer: Self-pay | Admitting: Internal Medicine

## 2013-05-04 MED ORDER — IPRATROPIUM BROMIDE 0.02 % IN SOLN: 500.0000 ug | Freq: Four times a day (QID) | RESPIRATORY_TRACT | Status: DC | PRN

## 2013-05-04 NOTE — Telephone Encounter (Signed)
Pt's wife is aware of CY recommendation. New rx will be sent to [facility] per the pt's wife request. Nothing further was needed.

## 2013-05-04 NOTE — Telephone Encounter (Signed)
Last OV 03/02/13. I spoke with the pt wife and she states each time after using the albuterol neb medication he gets tremors and feels cold all over. She states this morning was the worst episode because he took a treatment very early in the AM and [place] and when he came back he took another treatment and after had severe tremors. Please advise. Carron Curie, CMA No Known Allergies

## 2013-05-04 NOTE — Telephone Encounter (Signed)
Patient's wife calling back.  She states she is getting anxious over situation. Requesting call back asap.

## 2013-05-04 NOTE — Telephone Encounter (Signed)
Instead of using the albuterol neb solution in his machine   Offer Rx atrovent neb solution # 25 ampules, 1 q6h prn dyspnea. This shouldn't cause the problems he has had with albuterol

## 2013-05-04 NOTE — Telephone Encounter (Signed)
Explained to pt's spouse that we are still waiting for the msg to be addressed She verbalized understanding Nothing new to report on pt Please advise, thanks

## 2013-05-05 ENCOUNTER — Other Ambulatory Visit: Payer: Self-pay | Admitting: Internal Medicine

## 2013-05-09 NOTE — Telephone Encounter (Signed)
Please advise if okay to refill; looks as though last visit he wanted Rx's that were cheaper. Thanks.

## 2013-05-09 NOTE — Telephone Encounter (Signed)
Ok to refill 

## 2013-05-25 ENCOUNTER — Telehealth: Payer: Self-pay | Admitting: Internal Medicine

## 2013-05-25 NOTE — Telephone Encounter (Signed)
Insurance requests the patient try Symbicort, Spiriva, Foradil, Atrovent inhaler(they know pt is on nebulizer rx) or Combivent before approving Advair.   CY, please advise. Thanks.

## 2013-05-26 NOTE — Telephone Encounter (Signed)
Since his insurance won't cover Advair, suggest Symbicort 2 puffs then rinse mouth, twice daily, ref prn

## 2013-05-27 MED ORDER — BUDESONIDE-FORMOTEROL FUMARATE 80-4.5 MCG/ACT IN AERO: 2.0000 | INHALATION_SPRAY | Freq: Two times a day (BID) | RESPIRATORY_TRACT | Status: DC

## 2013-05-27 NOTE — Telephone Encounter (Signed)
Spoke with Mrs Hansson as patient is hard of hearing; she is aware of insurance not covering Advair 100/50 and that CY would like patient to try Symbicort 80/4.5 2 puffs BID and rinse mouth after use. I have sent Rx to [facility]. If any other questions or concerns patients wife will call our office.

## 2014-03-12 ENCOUNTER — Emergency Department (HOSPITAL_COMMUNITY): Payer: Medicare PPO

## 2014-03-12 ENCOUNTER — Inpatient Hospital Stay (HOSPITAL_COMMUNITY)
Admission: EM | Admit: 2014-03-12 | Discharge: 2014-03-15 | DRG: 193 | Disposition: A | Discharge status: Home / Self Care | Payer: Medicare PPO | Attending: Internal Medicine | Admitting: Internal Medicine

## 2014-03-12 ENCOUNTER — Encounter (HOSPITAL_COMMUNITY): Payer: Self-pay | Admitting: Emergency Medicine

## 2014-03-12 DIAGNOSIS — K219 Gastro-esophageal reflux disease without esophagitis: Secondary | ICD-10-CM | POA: Diagnosis present

## 2014-03-12 DIAGNOSIS — R7309 Other abnormal glucose: Secondary | ICD-10-CM | POA: Diagnosis present

## 2014-03-12 DIAGNOSIS — Z79899 Other long term (current) drug therapy: Secondary | ICD-10-CM

## 2014-03-12 DIAGNOSIS — J9601 Acute respiratory failure with hypoxia: Secondary | ICD-10-CM

## 2014-03-12 DIAGNOSIS — J438 Other emphysema: Secondary | ICD-10-CM

## 2014-03-12 DIAGNOSIS — J441 Chronic obstructive pulmonary disease with (acute) exacerbation: Secondary | ICD-10-CM | POA: Diagnosis present

## 2014-03-12 DIAGNOSIS — J9611 Chronic respiratory failure with hypoxia: Secondary | ICD-10-CM | POA: Diagnosis present

## 2014-03-12 DIAGNOSIS — J449 Chronic obstructive pulmonary disease, unspecified: Secondary | ICD-10-CM | POA: Diagnosis present

## 2014-03-12 DIAGNOSIS — J439 Emphysema, unspecified: Secondary | ICD-10-CM

## 2014-03-12 DIAGNOSIS — D72829 Elevated white blood cell count, unspecified: Secondary | ICD-10-CM | POA: Diagnosis present

## 2014-03-12 DIAGNOSIS — R739 Hyperglycemia, unspecified: Secondary | ICD-10-CM

## 2014-03-12 DIAGNOSIS — J189 Pneumonia, unspecified organism: Principal | ICD-10-CM | POA: Diagnosis present

## 2014-03-12 DIAGNOSIS — J9600 Acute respiratory failure, unspecified whether with hypoxia or hypercapnia: Secondary | ICD-10-CM | POA: Diagnosis present

## 2014-03-12 DIAGNOSIS — Z87891 Personal history of nicotine dependence: Secondary | ICD-10-CM

## 2014-03-12 DIAGNOSIS — T380X5A Adverse effect of glucocorticoids and synthetic analogues, initial encounter: Secondary | ICD-10-CM | POA: Diagnosis present

## 2014-03-12 LAB — BASIC METABOLIC PANEL
BUN: 11 mg/dL (ref 6–23)
CALCIUM: 8.7 mg/dL (ref 8.4–10.5)
CHLORIDE: 97 meq/L (ref 96–112)
CO2: 23 meq/L (ref 19–32)
CREATININE: 0.81 mg/dL (ref 0.50–1.35)
GFR calc Af Amer: >90 mL/min (ref >90)
GFR calc non Af Amer: 79 mL/min — ABNORMAL LOW (ref >90)
GLUCOSE: 150 mg/dL — AB (ref 70–99)
Potassium: 4.3 mEq/L (ref 3.7–5.3)
Sodium: 135 mEq/L — ABNORMAL LOW (ref 137–147)

## 2014-03-12 LAB — TROPONIN I: Troponin I: <0.3 ng/mL (ref <0.30)

## 2014-03-12 LAB — CBC WITH DIFFERENTIAL/PLATELET
Basophils Absolute: 0 10*3/uL (ref 0.0–0.1)
Basophils Relative: 0 % (ref 0–1)
EOS PCT: 0 % (ref 0–5)
Eosinophils Absolute: 0 10*3/uL (ref 0.0–0.7)
HEMATOCRIT: 46.6 % (ref 39.0–52.0)
HEMOGLOBIN: 16.2 g/dL (ref 13.0–17.0)
LYMPHS PCT: 7 % — AB (ref 12–46)
Lymphs Abs: 1.1 10*3/uL (ref 0.7–4.0)
MCH: 31.6 pg (ref 26.0–34.0)
MCHC: 34.8 g/dL (ref 30.0–36.0)
MCV: 90.8 fL (ref 78.0–100.0)
MONOS PCT: 4 % (ref 3–12)
Monocytes Absolute: 0.7 10*3/uL (ref 0.1–1.0)
NEUTROS ABS: 14.6 10*3/uL — AB (ref 1.7–7.7)
Neutrophils Relative %: 89 % — ABNORMAL HIGH (ref 43–77)
Platelets: 164 10*3/uL (ref 150–400)
RBC: 5.13 MIL/uL (ref 4.22–5.81)
RDW: 13.1 % (ref 11.5–15.5)
WBC: 16.4 10*3/uL — ABNORMAL HIGH (ref 4.0–10.5)

## 2014-03-12 LAB — I-STAT CG4 LACTIC ACID, ED: Lactic Acid, Venous: 1.43 mmol/L (ref 0.5–2.2)

## 2014-03-12 LAB — PRO B NATRIURETIC PEPTIDE: Pro B Natriuretic peptide (BNP): 222.2 pg/mL (ref 0–450)

## 2014-03-12 MED ORDER — HEPARIN SODIUM (PORCINE) 5000 UNIT/ML IJ SOLN
5000.0000 [IU] | Freq: Three times a day (TID) | INTRAMUSCULAR | Status: DC
  Administered 2014-03-13 – 2014-03-15 (×9): 5000 [IU] via SUBCUTANEOUS
  Filled 2014-03-12 (×11): qty 1

## 2014-03-12 MED ORDER — DEXTROSE 5 % IV SOLN
500.0000 mg | Freq: Once | INTRAVENOUS | Status: AC
  Administered 2014-03-12: 500 mg via INTRAVENOUS

## 2014-03-12 MED ORDER — FINASTERIDE 5 MG PO TABS
2.5000 mg | ORAL_TABLET | ORAL | Status: DC
  Administered 2014-03-13 – 2014-03-15 (×2): 2.5 mg via ORAL
  Filled 2014-03-12 (×2): qty 0.5

## 2014-03-12 MED ORDER — METHYLPREDNISOLONE SODIUM SUCC 125 MG IJ SOLR: 60.0000 mg | Freq: Two times a day (BID) | INTRAMUSCULAR | Status: DC

## 2014-03-12 MED ORDER — ALBUTEROL SULFATE (2.5 MG/3ML) 0.083% IN NEBU: 2.5000 mg | INHALATION_SOLUTION | Freq: Four times a day (QID) | RESPIRATORY_TRACT | Status: DC | PRN

## 2014-03-12 MED ORDER — PANTOPRAZOLE SODIUM 20 MG PO TBEC
20.0000 mg | DELAYED_RELEASE_TABLET | ORAL | Status: DC
  Administered 2014-03-13 – 2014-03-15 (×2): 20 mg via ORAL
  Filled 2014-03-12 (×2): qty 1

## 2014-03-12 MED ORDER — CHELATED ZINC 50 MG PO TABS: 25.0000 mg | ORAL_TABLET | ORAL | Status: DC

## 2014-03-12 MED ORDER — METHYLPREDNISOLONE SODIUM SUCC 125 MG IJ SOLR
125.0000 mg | Freq: Once | INTRAMUSCULAR | Status: AC
  Administered 2014-03-12: 125 mg via INTRAVENOUS
  Filled 2014-03-12: qty 2

## 2014-03-12 MED ORDER — ACETAMINOPHEN 325 MG PO TABS: 650.0000 mg | ORAL_TABLET | Freq: Four times a day (QID) | ORAL | Status: DC | PRN

## 2014-03-12 MED ORDER — DEXTROSE 5 % IV SOLN
1.0000 g | Freq: Once | INTRAVENOUS | Status: AC
  Administered 2014-03-12: 1 g via INTRAVENOUS
  Filled 2014-03-12: qty 10

## 2014-03-12 MED ORDER — PREDNISONE 20 MG PO TABS
40.0000 mg | ORAL_TABLET | Freq: Every day | ORAL | Status: DC
  Administered 2014-03-13 – 2014-03-15 (×3): 40 mg via ORAL
  Filled 2014-03-12 (×4): qty 2

## 2014-03-12 MED ORDER — IPRATROPIUM-ALBUTEROL 0.5-2.5 (3) MG/3ML IN SOLN
3.0000 mL | Freq: Once | RESPIRATORY_TRACT | Status: DC
  Filled 2014-03-12: qty 3

## 2014-03-12 MED ORDER — IPRATROPIUM-ALBUTEROL 0.5-2.5 (3) MG/3ML IN SOLN
3.0000 mL | Freq: Once | RESPIRATORY_TRACT | Status: AC
  Administered 2014-03-12: 3 mL via RESPIRATORY_TRACT

## 2014-03-12 MED ORDER — DEXTROSE 5 % IV SOLN
1.0000 g | INTRAVENOUS | Status: DC
  Administered 2014-03-13 – 2014-03-14 (×2): 1 g via INTRAVENOUS
  Filled 2014-03-12 (×3): qty 10

## 2014-03-12 MED ORDER — DOXAZOSIN MESYLATE 2 MG PO TABS
2.0000 mg | ORAL_TABLET | ORAL | Status: DC
  Administered 2014-03-13 – 2014-03-15 (×2): 2 mg via ORAL
  Filled 2014-03-12 (×2): qty 1

## 2014-03-12 MED ORDER — AZITHROMYCIN 500 MG PO TABS
500.0000 mg | ORAL_TABLET | Freq: Every day | ORAL | Status: DC
  Administered 2014-03-13 – 2014-03-15 (×3): 500 mg via ORAL
  Filled 2014-03-12 (×3): qty 1

## 2014-03-12 NOTE — ED Notes (Signed)
Denies: pain, sob, nausea or dizziness. Sitting with grandson at [facility]. Alert, NAD, calm, resps e/u, speaking in clear complete sentences, skin W&D. Pending inpt room assignment.

## 2014-03-12 NOTE — ED Notes (Addendum)
Per EMS, pt reports generalized weakness/fatigue with nausea and vomiting x2 days. Pt. Urgent care transfer for rule out pneumonia. Pt. Reports cough for the past couple days as well. States chest hurts when he coughs.

## 2014-03-12 NOTE — ED Provider Notes (Signed)
I saw and evaluated the patient, reviewed the resident's note and I agree with the findings and plan.   EKG Interpretation   Date/Time:  Sunday March 12 2014 18:22:24 EDT Ventricular Rate:  105 PR Interval:  169 QRS Duration: 69 QT Interval:  314 QTC Calculation: 415 R Axis:   29 Text Interpretation:  Age not entered, assumed to be  80 years old for  purpose of ECG interpretation Sinus tachycardia Atrial premature complexes  Probable anteroseptal infarct, old Minimal ST depression, anterolateral  leads Confirmed by Renise Gillies,  DO, Sakeena Teall [phone]) on 03/12/2014 6:30:47 PM      Pt is a 80 y.o. M with history of COPD who presents emergency department with cough, congestion and shortness of breath. On exam he is having wheezing and diminished breath sounds at his bases bilaterally. He expresses a low-grade fever of 100.0 today and productive cough with yellow sputum. Concern for community-acquired pneumonia. He has a new oxygen requirement. He does wear oxygen at home. He will need admission. We'll give IV Solu-Medrol as well as duo nebs given there may be a component of a COPD exacerbation as well. No respiratory distress. He is otherwise hemodynamically stable.  [place], DO 03/12/14 2045

## 2014-03-12 NOTE — ED Provider Notes (Signed)
CSN: [id number]     Arrival date & time 03/12/14  1816 History   First MD Initiated Contact with Patient 03/12/14 1853     Chief Complaint  Patient presents with  . Weakness   HPI 80 year old male with history of COPD presents complaining of cough.  Symptoms started about 2 days ago. He's had a cough productive of yellow sputum. He's had subjective fever. Wife states it was 100.1 at home. He's had shortness of breath and this has been progressive and moderate in severity. He has had some wheezing. He's had fatigue and generalized weakness. He's had decreased appetite, decreased energy level and has not been out of bed much today. Is very unusual for him. He was seen by his primary care physician today and found to have relative hypoxia to 90%. He is also tachycardic and was transferred to the emergency department for evaluation.  Has not had any recent hospitalizations or antibiotic use.  Past Medical History  Diagnosis Date  . Allergic rhinitis, cause unspecified   . Asthma   . Chronic airway obstruction, not elsewhere classified   . Esophageal reflux   . BPH (benign prostatic hyperplasia)    Past Surgical History  Procedure Laterality Date  . Turp vaporization    . Vasectomy     Family History  Problem Relation Age of Onset  . Cancer      both parents   History  Substance Use Topics  . Smoking status: Former Research scientist (life sciences)  . Smokeless tobacco: Not on file  . Alcohol Use: Not on file    Review of Systems  Constitutional: Positive for fever, chills, activity change and appetite change.  HENT: Positive for congestion and rhinorrhea.   Eyes: Negative for visual disturbance.  Respiratory: Positive for cough, shortness of breath and wheezing.   Cardiovascular: Negative for chest pain and leg swelling.  Gastrointestinal: Negative for nausea, vomiting, abdominal pain and diarrhea.  Genitourinary: Negative for dysuria, urgency, frequency, flank pain and difficulty urinating.   Musculoskeletal: Negative for back pain, neck pain and neck stiffness.  Skin: Negative for rash.  Neurological: Positive for weakness (generalized). Negative for syncope, numbness and headaches.  All other systems reviewed and are negative.      Allergies  Tape  Home Medications   No current outpatient prescriptions on file. BP 117/69  Pulse 100  Temp(Src) 98 F (36.7 C) (Oral)  Resp 20  Ht 5\' 6"  (1.676 m)  Wt 143 lb 4.8 oz (65 kg)  BMI 23.14 kg/m2  SpO2 91% Physical Exam  Nursing note and vitals reviewed. Constitutional: He is oriented to person, place, and time. He appears well-developed and well-nourished. No distress.  Nontoxic-appearing  HENT:  Head: Normocephalic and atraumatic.  Mouth/Throat: Oropharynx is clear and moist.  Eyes: Conjunctivae and EOM are normal. Pupils are equal, round, and reactive to light. No scleral icterus.  Neck: Normal range of motion. Neck supple. No JVD present.  Cardiovascular: Regular rhythm, normal heart sounds and intact distal pulses.  Exam reveals no gallop and no friction rub.   No murmur heard. Tachycardia 120s  Pulmonary/Chest: Effort normal. No respiratory distress. He has no wheezes. He has no rales.  Tachypnea, diffuse rhonchi, sats 88% on room air  Abdominal: Soft. He exhibits no distension. There is no tenderness. There is no rebound and no guarding.  Musculoskeletal: He exhibits no edema.  Neurological: He is alert and oriented to person, place, and time. No cranial nerve deficit. He exhibits normal muscle tone. Coordination normal.  Skin: Skin is warm and dry. No rash noted.  Psychiatric: He has a normal mood and affect.    ED Course  Procedures (including critical care time) Labs Review Labs Reviewed  CBC WITH DIFFERENTIAL - Abnormal; Notable for the following:    WBC 16.4 (*)    Neutrophils Relative % 89 (*)    Neutro Abs 14.6 (*)    Lymphocytes Relative 7 (*)    All other components within normal limits   BASIC METABOLIC PANEL - Abnormal; Notable for the following:    Sodium 135 (*)    Glucose, Bld 150 (*)    GFR calc non Af Amer 79 (*)    All other components within normal limits  CULTURE, BLOOD (ROUTINE X 2)  CULTURE, BLOOD (ROUTINE X 2)  CULTURE, EXPECTORATED SPUTUM-ASSESSMENT  GRAM STAIN  TROPONIN I  PRO B NATRIURETIC PEPTIDE  URINALYSIS, ROUTINE W REFLEX MICROSCOPIC  STREP PNEUMONIAE URINARY ANTIGEN  LEGIONELLA ANTIGEN, URINE  CBC  BASIC METABOLIC PANEL  HIV ANTIBODY (ROUTINE TESTING)  I-STAT CG4 LACTIC ACID, ED   Imaging Review Dg Chest 2 View  03/12/2014   CLINICAL DATA:  Persistent cough  EXAM: CHEST  2 VIEW  COMPARISON:  Study obtained earlier in the day and March 02, 2013  FINDINGS: There is underlying emphysema. There is interstitial fibrosis in the mid and lower lung zones bilaterally. In comparison with the prior study from 2014, there is increased opacity in the left lower lobe and lingula ; a degree of interstitial pneumonitis superimposed on fibrotic change must be of concern.  Heart size is normal with pulmonary vascularity reflecting a degree of underlying emphysema. No adenopathy.  IMPRESSION: Suspect interstitial pneumonitis in the inferior lingula and left base superimposed on mid and lower lung zone interstitial fibrosis. There is underlying emphysema as well.   Electronically Signed   By: Lowella Grip [name]   On: 03/12/2014 21:13     EKG Interpretation   Date/Time:  Sunday March 12 2014 18:22:24 EDT Ventricular Rate:  105 PR Interval:  169 QRS Duration: 69 QT Interval:  314 QTC Calculation: 415 R Axis:   29 Text Interpretation:  Age not entered, assumed to be  80 years old for  purpose of ECG interpretation Sinus tachycardia Atrial premature complexes  Probable anteroseptal infarct, old Minimal ST depression, anterolateral  leads Confirmed by WARD,  DO, KRISTEN [phone]) on 03/12/2014 6:30:47 PM      MDM   80 year old male with COPD here with cough  shortness of breath, fever.  Tachycardic to 120s, hypoxic to 88%, normotensive. Nontoxic-appearing diffuse rhonchi on auscultation. Remainder of exam is unremarkable.  Obtained blood cultures prior to antibiotics Rocephin and azithromycin for acute community acquired pneumonia 2 views of IV fluid crystalloid and tachycardia improved to 90s. Chest x-ray with left-sided infiltrates consistent with pneumonia.  Given his age, hypoxia, sepsis syndrome with tachycardia he is admitted to the hospital service for further management.    Final diagnoses:  CAP (community acquired pneumonia)  COPD with emphysema       Wendall Papa, MD 03/13/14 [phone]

## 2014-03-12 NOTE — H&P (Signed)
[facility] History and Physical  TRISTIN VANDEUSEN LFY:[id number] DOB: 31-Jan-1927 DOA: 03/12/2014  Referring physician: EDP PCP: PROVIDER NOT IN SYSTEM   Chief Complaint: Cough, SOB   HPI: [name] is a 80 y.o. male with h/o COPD who presents to the ED with cough, congestion, and SOB.  Low grade fever of 100 today.  Symptoms onset 2 days ago and have progressively worsened.  He presented initially to UC who sent him to the ED.  Cough productive of yellow sputum.  Patient has new O2 requirement in ED.  Review of Systems: Systems reviewed.  As above, otherwise negative  Past Medical History  Diagnosis Date  . Allergic rhinitis, cause unspecified   . Asthma   . Chronic airway obstruction, not elsewhere classified   . Esophageal reflux   . BPH (benign prostatic hyperplasia)    Past Surgical History  Procedure Laterality Date  . Turp vaporization    . Vasectomy     Social History:  reports that he has quit smoking. He does not have any smokeless tobacco history on file. His alcohol and drug histories are not on file.  Allergies  Allergen Reactions  . Tape Rash and Other (See Comments)    Pulls off skin    Family History  Problem Relation Age of Onset  . Cancer      both parents     Prior to Admission medications   Medication Sig Start Date End Date Taking? Authorizing Provider  acetaminophen (TYLENOL) 325 MG tablet Take 650 mg by mouth every 6 (six) hours as needed for mild pain.    Yes Historical Provider, MD  albuterol (PROVENTIL) (2.5 MG/3ML) 0.083% nebulizer solution Take 3 mLs (2.5 mg total) by nebulization every 6 (six) hours as needed for wheezing. 03/02/13  Yes Deneise Lever, MD  Ascorbic Acid (VITAMIN C) 500 MG tablet Take 500 mg by mouth daily.     Yes Historical Provider, MD  Chelated Zinc 50 MG TABS Take 25 mg by mouth every other day. Take 1/2 tablet by mouth every other day   Yes Historical Provider, MD  dextromethorphan-guaiFENesin (MUCINEX DM) 30-600  MG per 12 hr tablet Take 1 tablet by mouth every 12 (twelve) hours as needed for cough.    Yes Historical Provider, MD  doxazosin (CARDURA) 8 MG tablet Take 2 mg by mouth every other day.   Yes Historical Provider, MD  finasteride (PROSCAR) 5 MG tablet Take 2.5 mg by mouth every other day.   Yes Historical Provider, MD  Fluticasone-Salmeterol (ADVAIR) 100-50 MCG/DOSE AEPB Inhale 1 puff into the lungs daily.   Yes Historical Provider, MD  Multiple Vitamins-Minerals (ICAPS PO) Take 1 tablet by mouth 2 (two) times daily.   Yes Historical Provider, MD  Multiple Vitamins-Minerals (MULTIVITAMIN WITH MINERALS) tablet Take 1 tablet by mouth every other day.   Yes Historical Provider, MD  pantoprazole (PROTONIX) 20 MG tablet Take 20 mg by mouth every other day.   Yes Historical Provider, MD  Probiotic Product (PROBIOTIC DAILY PO) Take 1 tablet by mouth daily.   Yes Historical Provider, MD   Physical Exam: Filed Vitals:   03/12/14 2200  BP: 121/63  Pulse: 98  Temp:   Resp: 18    BP 121/63  Pulse 98  Temp(Src) 98.5 F (36.9 C) (Oral)  Resp 18  SpO2 92%  General Appearance:    Alert, oriented, no distress, appears stated age  Head:    Normocephalic, atraumatic  Eyes:  PERRL, EOMI, sclera non-icteric        Nose:   Nares without drainage or epistaxis. Mucosa, turbinates normal  Throat:   Moist mucous membranes. Oropharynx without erythema or exudate.  Neck:   Supple. No carotid bruits.  No thyromegaly.  No lymphadenopathy.   Back:     No CVA tenderness, no spinal tenderness  Lungs:     CTA B at the time of my examination of the patient  Chest wall:    No tenderness to palpitation  Heart:    Regular rate and rhythm without murmurs, gallops, rubs  Abdomen:     Soft, non-tender, nondistended, normal bowel sounds, no organomegaly  Genitalia:    deferred  Rectal:    deferred  Extremities:   No clubbing, cyanosis or edema.  Pulses:   2+ and symmetric all extremities  Skin:   Skin color,  texture, turgor normal, no rashes or lesions  Lymph nodes:   Cervical, supraclavicular, and axillary nodes normal  Neurologic:   CNII-XII intact. Normal strength, sensation and reflexes      throughout    Labs on Admission:  Basic Metabolic Panel:  Recent Labs Lab 03/12/14 1905  NA 135*  K 4.3  CL 97  CO2 23  GLUCOSE 150*  BUN 11  CREATININE 0.81  CALCIUM 8.7   Liver Function Tests: No results found for this basename: AST, ALT, ALKPHOS, BILITOT, PROT, ALBUMIN,  in the last 168 hours No results found for this basename: LIPASE, AMYLASE,  in the last 168 hours No results found for this basename: AMMONIA,  in the last 168 hours CBC:  Recent Labs Lab 03/12/14 1905  WBC 16.4*  NEUTROABS 14.6*  HGB 16.2  HCT 46.6  MCV 90.8  PLT 164   Cardiac Enzymes:  Recent Labs Lab 03/12/14 1905  TROPONINI <0.30    BNP (last 3 results)  Recent Labs  03/12/14 1938  PROBNP 222.2   CBG: No results found for this basename: GLUCAP,  in the last 168 hours  Radiological Exams on Admission: Dg Chest 2 View  03/12/2014   CLINICAL DATA:  Persistent cough  EXAM: CHEST  2 VIEW  COMPARISON:  Study obtained earlier in the day and March 02, 2013  FINDINGS: There is underlying emphysema. There is interstitial fibrosis in the mid and lower lung zones bilaterally. In comparison with the prior study from 2014, there is increased opacity in the left lower lobe and lingula ; a degree of interstitial pneumonitis superimposed on fibrotic change must be of concern.  Heart size is normal with pulmonary vascularity reflecting a degree of underlying emphysema. No adenopathy.  IMPRESSION: Suspect interstitial pneumonitis in the inferior lingula and left base superimposed on mid and lower lung zone interstitial fibrosis. There is underlying emphysema as well.   Electronically Signed   By: Lowella Grip [name]   On: 03/12/2014 21:13    EKG: Independently reviewed.  Assessment/Plan Principal Problem:    CAP (community acquired pneumonia) Active Problems:   COPD with emphysema   Community acquired pneumonia   1. CAP - patient on CAP pathway, cultures pending, rocephin and azithromycin ordered. 2. COPD - patients CAP appears to have set of a COPD exacerbation, was very wheezy initially per report, but this appears to have dramatically improved after steroids and neb treatments.  Will go ahead and put him on oral steroids, 40mg  daily, suspect this will be changed to medrol dos pak on discharge for taper.    Code Status: Full  Code  Family Communication: Son at bedside Disposition Plan: Admit to inpatient  Time spent: 60 min  GARDNER, JARED M. [facility] Pager [phone]  If 7AM-7PM, please contact the day team taking care of the patient [email or web address] Password [email or web address] 03/12/2014, 10:11 PM

## 2014-03-12 NOTE — ED Notes (Signed)
Dr. Alcario Drought (admitting)  at [facility]

## 2014-03-12 NOTE — ED Notes (Signed)
Pt taken to xray 

## 2014-03-13 ENCOUNTER — Encounter (HOSPITAL_COMMUNITY): Payer: Self-pay | Admitting: *Deleted

## 2014-03-13 DIAGNOSIS — J9600 Acute respiratory failure, unspecified whether with hypoxia or hypercapnia: Secondary | ICD-10-CM

## 2014-03-13 DIAGNOSIS — R7309 Other abnormal glucose: Secondary | ICD-10-CM

## 2014-03-13 DIAGNOSIS — R739 Hyperglycemia, unspecified: Secondary | ICD-10-CM | POA: Diagnosis present

## 2014-03-13 DIAGNOSIS — J9611 Chronic respiratory failure with hypoxia: Secondary | ICD-10-CM | POA: Diagnosis present

## 2014-03-13 DIAGNOSIS — E1165 Type 2 diabetes mellitus with hyperglycemia: Secondary | ICD-10-CM | POA: Diagnosis present

## 2014-03-13 DIAGNOSIS — J441 Chronic obstructive pulmonary disease with (acute) exacerbation: Secondary | ICD-10-CM | POA: Diagnosis present

## 2014-03-13 LAB — BASIC METABOLIC PANEL
BUN: 17 mg/dL (ref 6–23)
CALCIUM: 9.2 mg/dL (ref 8.4–10.5)
CO2: 23 mEq/L (ref 19–32)
Chloride: 97 mEq/L (ref 96–112)
Creatinine, Ser: 0.79 mg/dL (ref 0.50–1.35)
GFR calc Af Amer: >90 mL/min (ref >90)
GFR, EST NON AFRICAN AMERICAN: 80 mL/min — AB (ref >90)
Glucose, Bld: 273 mg/dL — ABNORMAL HIGH (ref 70–99)
Potassium: 4.2 mEq/L (ref 3.7–5.3)
SODIUM: 139 meq/L (ref 137–147)

## 2014-03-13 LAB — CBC
HCT: 46.4 % (ref 39.0–52.0)
Hemoglobin: 16.2 g/dL (ref 13.0–17.0)
MCH: 31.5 pg (ref 26.0–34.0)
MCHC: 34.9 g/dL (ref 30.0–36.0)
MCV: 90.3 fL (ref 78.0–100.0)
PLATELETS: 165 10*3/uL (ref 150–400)
RBC: 5.14 MIL/uL (ref 4.22–5.81)
RDW: 13.1 % (ref 11.5–15.5)
WBC: 15.9 10*3/uL — ABNORMAL HIGH (ref 4.0–10.5)

## 2014-03-13 LAB — URINALYSIS, ROUTINE W REFLEX MICROSCOPIC
BILIRUBIN URINE: NEGATIVE
GLUCOSE, UA: 100 mg/dL — AB
KETONES UR: 15 mg/dL — AB
Leukocytes, UA: NEGATIVE
Nitrite: NEGATIVE
Protein, ur: NEGATIVE mg/dL
SPECIFIC GRAVITY, URINE: 1.011 (ref 1.005–1.030)
Urobilinogen, UA: 0.2 mg/dL (ref 0.0–1.0)
pH: 6 (ref 5.0–8.0)

## 2014-03-13 LAB — LEGIONELLA ANTIGEN, URINE: LEGIONELLA ANTIGEN, URINE: NEGATIVE

## 2014-03-13 LAB — STREP PNEUMONIAE URINARY ANTIGEN: STREP PNEUMO URINARY ANTIGEN: NEGATIVE

## 2014-03-13 LAB — GRAM STAIN

## 2014-03-13 LAB — EXPECTORATED SPUTUM ASSESSMENT W REFEX TO RESP CULTURE

## 2014-03-13 LAB — URINE MICROSCOPIC-ADD ON

## 2014-03-13 LAB — GLUCOSE, CAPILLARY: Glucose-Capillary: 167 mg/dL — ABNORMAL HIGH (ref 70–99)

## 2014-03-13 LAB — EXPECTORATED SPUTUM ASSESSMENT W GRAM STAIN, RFLX TO RESP C

## 2014-03-13 MED ORDER — ACETAMINOPHEN 325 MG PO TABS: 650.0000 mg | ORAL_TABLET | Freq: Four times a day (QID) | ORAL | Status: DC | PRN

## 2014-03-13 MED ORDER — INSULIN ASPART 100 UNIT/ML ~~LOC~~ SOLN
0.0000 [IU] | Freq: Three times a day (TID) | SUBCUTANEOUS | Status: DC
  Administered 2014-03-14: 1 [IU] via SUBCUTANEOUS
  Administered 2014-03-14 – 2014-03-15 (×2): 2 [IU] via SUBCUTANEOUS

## 2014-03-13 NOTE — Progress Notes (Signed)
PROGRESS NOTE    ASPEN DETERDING OZH:[id number] DOB: Mar 03, 1927 DOA: 03/12/2014 PCP: PROVIDER NOT IN SYSTEM  HPI/Brief narrative 80 year old male with history of COPD a former smoker, not on home oxygen, GERD, BPH, presented with 2 days history of productive cough, low-grade fevers and worsening dyspnea. In the ED, patient was found to be hypoxic and chest x-ray suggested pneumonia.   Assessment/Plan:  1. Community acquired pneumonia: Continue IV Rocephin and azithromycin. Follow sputum culture results. 2. COPD exacerbation: Precipitated by pneumonia. Continue treatment of pneumonia, oral steroids, oxygen and bronchodilators. 3. Acute respiratory failure with hypoxia: Secondary to pneumonia and COPD. Treatment as above 4. Hyperglycemia: Check hemoglobin A1c. Place on sliding scale insulin. 5. Leukocytosis: Likely secondary to pneumonia and steroids.    Code Status:  Full Family Communication:  Discussed with patient spouse and extended family at bedside. Disposition Plan:  Remains inpatient. Home when medically stable.   Consultants:   None  Procedures:   None  Antibiotics:   IV Rocephin and azithromycin   Subjective:  Feels better. Denies chest pain or dyspnea. Cough has improved.  Objective: Filed Vitals:   03/12/14 2245 03/12/14 2301 03/13/14 0433 03/13/14 1426  BP: 125/62 117/69 152/81 117/65  Pulse: 95 100 99 86  Temp:  98 F (36.7 C) 97.4 F (36.3 C) 98 F (36.7 C)  TempSrc:  Oral Oral Oral  Resp: 21 20 20 18   Height:  5\' 6"  (1.676 m)    Weight:  65 kg (143 lb 4.8 oz)    SpO2: 90% 91% 92% 94%    Intake/Output Summary (Last 24 hours) at 03/13/14 1823 Last data filed at 03/12/14 2330  Gross per 24 hour  Intake    290 ml  Output    400 ml  Net   -110 ml   Filed Weights   03/12/14 2301  Weight: 65 kg (143 lb 4.8 oz)     Exam:  General exam:  Pleasant elderly male lying comfortably in bed. Respiratory system:  Reduced breath sounds especially in  the bases with few basal crackles. No wheezing or rhonchi.  No increased work of breathing. Cardiovascular system: S1 & S2 heard, RRR. No JVD, murmurs, gallops, clicks or pedal edema. Gastrointestinal system: Abdomen is nondistended, soft and nontender. Normal bowel sounds heard. Central nervous system: Alert and oriented. No focal neurological deficits. Extremities: Symmetric 5 x 5 power.   Data Reviewed: Basic Metabolic Panel:  Recent Labs Lab 03/12/14 1905 03/13/14 0815  NA 135* 139  K 4.3 4.2  CL 97 97  CO2 23 23  GLUCOSE 150* 273*  BUN 11 17  CREATININE 0.81 0.79  CALCIUM 8.7 9.2   Liver Function Tests: No results found for this basename: AST, ALT, ALKPHOS, BILITOT, PROT, ALBUMIN,  in the last 168 hours No results found for this basename: LIPASE, AMYLASE,  in the last 168 hours No results found for this basename: AMMONIA,  in the last 168 hours CBC:  Recent Labs Lab 03/12/14 1905 03/13/14 0815  WBC 16.4* 15.9*  NEUTROABS 14.6*  --   HGB 16.2 16.2  HCT 46.6 46.4  MCV 90.8 90.3  PLT 164 165   Cardiac Enzymes:  Recent Labs Lab 03/12/14 1905  TROPONINI <0.30   BNP (last 3 results)  Recent Labs  03/12/14 1938  PROBNP 222.2   CBG: No results found for this basename: GLUCAP,  in the last 168 hours  Recent Results (from the past 240 hour(s))  CULTURE, EXPECTORATED SPUTUM-ASSESSMENT  Status: None   Collection Time    03/13/14  5:00 AM      Result Value Ref Range Status   Specimen Description SPUTUM   Final   Special Requests NONE   Final   Sputum evaluation     Final   Value: THIS SPECIMEN IS ACCEPTABLE. RESPIRATORY CULTURE REPORT TO FOLLOW.   Report Status 03/13/2014 FINAL   Final  GRAM STAIN     Status: None   Collection Time    03/13/14  5:00 AM      Result Value Ref Range Status   Specimen Description SPUTUM   Final   Special Requests NONE   Final   Gram Stain     Final   Value: SPUTUM     ABUNDANT WBC PRESENT,BOTH PMN AND MONONUCLEAR      RARE GRAM NEGATIVE RODS     RARE GRAM POSITIVE COCCI IN PAIRS   Report Status 03/13/2014 FINAL   Final       Studies: Dg Chest 2 View  03/12/2014   CLINICAL DATA:  Persistent cough  EXAM: CHEST  2 VIEW  COMPARISON:  Study obtained earlier in the day and March 02, 2013  FINDINGS: There is underlying emphysema. There is interstitial fibrosis in the mid and lower lung zones bilaterally. In comparison with the prior study from 2014, there is increased opacity in the left lower lobe and lingula ; a degree of interstitial pneumonitis superimposed on fibrotic change must be of concern.  Heart size is normal with pulmonary vascularity reflecting a degree of underlying emphysema. No adenopathy.  IMPRESSION: Suspect interstitial pneumonitis in the inferior lingula and left base superimposed on mid and lower lung zone interstitial fibrosis. There is underlying emphysema as well.   Electronically Signed   By: Lowella Grip [name]   On: 03/12/2014 21:13        Scheduled Meds: . azithromycin  500 mg Oral Daily  . cefTRIAXone (ROCEPHIN)  IV  1 g Intravenous Q24H  . doxazosin  2 mg Oral QODAY  . finasteride  2.5 mg Oral QODAY  . heparin  5,000 Units Subcutaneous 3 times per day  . pantoprazole  20 mg Oral QODAY  . predniSONE  40 mg Oral Q breakfast   Continuous Infusions:   Principal Problem:   CAP (community acquired pneumonia) Active Problems:   COPD with emphysema   Community acquired pneumonia   COPD exacerbation   Acute respiratory failure with hypoxia   Hyperglycemia    Time spent:  33 minutes    Aubrianna Orchard, MD, FACP, FHM. [facility] Pager [phone]  If 7PM-7AM, please contact night-coverage [email or web address] Password [email or web address] 03/13/2014, 6:23 PM    LOS: 1 day

## 2014-03-14 LAB — HEMOGLOBIN A1C
Hgb A1c MFr Bld: 6.6 % — ABNORMAL HIGH (ref <5.7)
MEAN PLASMA GLUCOSE: 143 mg/dL — AB (ref <117)

## 2014-03-14 LAB — GLUCOSE, CAPILLARY
GLUCOSE-CAPILLARY: 120 mg/dL — AB (ref 70–99)
GLUCOSE-CAPILLARY: 152 mg/dL — AB (ref 70–99)
GLUCOSE-CAPILLARY: 113 mg/dL — AB (ref 70–99)
Glucose-Capillary: 121 mg/dL — ABNORMAL HIGH (ref 70–99)

## 2014-03-14 LAB — CBC
HCT: 43.3 % (ref 39.0–52.0)
Hemoglobin: 15 g/dL (ref 13.0–17.0)
MCH: 31.6 pg (ref 26.0–34.0)
MCHC: 34.6 g/dL (ref 30.0–36.0)
MCV: 91.4 fL (ref 78.0–100.0)
Platelets: 164 10*3/uL (ref 150–400)
RBC: 4.74 MIL/uL (ref 4.22–5.81)
RDW: 13.2 % (ref 11.5–15.5)
WBC: 15.9 10*3/uL — ABNORMAL HIGH (ref 4.0–10.5)

## 2014-03-14 MED ORDER — PNEUMOCOCCAL VAC POLYVALENT 25 MCG/0.5ML IJ INJ: 0.5000 mL | INJECTION | INTRAMUSCULAR | Status: DC

## 2014-03-14 NOTE — Progress Notes (Signed)
PT Cancellation Note  Patient Details Name: [name] MRN: [id number] DOB: January 11, 1927   Cancelled Treatment:    Reason Eval/Treat Not Completed: Other (comment).  Pt just walked with RN, now sleeping soundly in room.  PT to check back in AM for evaluation.    Thanks,    Barbarann Ehlers. [facility], [place], DPT [phone]   03/14/2014, 4:06 PM

## 2014-03-14 NOTE — Progress Notes (Signed)
Patient ambulated in hallway, approximately 100 ft, without wearing oxygen. He's sats upon returning to the room was 89% room air. O2 was reapplied via the [place]. Will continue to monitor.

## 2014-03-14 NOTE — Progress Notes (Signed)
PROGRESS NOTE    [name] VPX:[id number] DOB: 01-19-1927 DOA: 03/12/2014 PCP: PROVIDER NOT IN SYSTEM  HPI/Brief narrative 80 year old male with history of COPD a former smoker, not on home oxygen, GERD, BPH, hard of hearing, presented with 2 days history of productive cough, low-grade fevers and worsening dyspnea. In the ED, patient was found to be hypoxic and chest x-ray suggested pneumonia.   Assessment/Plan:  1. Community acquired pneumonia: Continue IV Rocephin and azithromycin. Follow sputum culture results. Improving. Continue additional 24 hours of IV antibiotics, then transitioned to by mouth. Will need followup chest x-rays in 4-6 weeks to ensure resolution of pneumonia findings. 2. COPD exacerbation: Precipitated by pneumonia. Continue treatment of pneumonia, oral steroids, oxygen and bronchodilators. 3. Acute respiratory failure with hypoxia: Secondary to pneumonia and COPD. Treatment as above. Check desaturation evaluation. 4. Hyperglycemia without diagnosis of diabetes: Hemoglobin A1c: 6.2. Place on sliding scale insulin. 5. Leukocytosis: Likely secondary to pneumonia and steroids. Stable    Code Status:  Full Family Communication:  None at bedside Disposition Plan:  Remains inpatient. Home when medically stable.   Consultants:   None  Procedures:   None  Antibiotics:   IV Rocephin and azithromycin   Subjective: Denies chest pain or dyspnea. Mild nonproductive cough.  Objective: Filed Vitals:   03/13/14 1426 03/13/14 2102 03/14/14 0552 03/14/14 0830  BP: 117/65 139/75 150/66 136/82  Pulse: 86 90 79 96  Temp: 98 F (36.7 C) 98.5 F (36.9 C) 97.9 F (36.6 C) 97.5 F (36.4 C)  TempSrc: Oral Oral Oral Oral  Resp: 18 18 18 16   Height:      Weight:      SpO2: 94% 97% 96% 95%    Intake/Output Summary (Last 24 hours) at 03/14/14 1431 Last data filed at 03/14/14 0827  Gross per 24 hour  Intake    240 ml  Output    550 ml  Net   -310 ml   Filed  Weights   03/12/14 2301  Weight: 65 kg (143 lb 4.8 oz)     Exam:  General exam:  Pleasant elderly male sitting up in bed. Extremely hard of hearing. Respiratory system:  Reduced breath sounds especially in the bases with occasional rhonchi.  No increased work of breathing. Cardiovascular system: S1 & S2 heard, RRR. No JVD, murmurs, gallops, clicks or pedal edema. Gastrointestinal system: Abdomen is nondistended, soft and nontender. Normal bowel sounds heard. Central nervous system: Alert and oriented. No focal neurological deficits. Extremities: Symmetric 5 x 5 power.   Data Reviewed: Basic Metabolic Panel:  Recent Labs Lab 03/12/14 1905 03/13/14 0815  NA 135* 139  K 4.3 4.2  CL 97 97  CO2 23 23  GLUCOSE 150* 273*  BUN 11 17  CREATININE 0.81 0.79  CALCIUM 8.7 9.2   Liver Function Tests: No results found for this basename: AST, ALT, ALKPHOS, BILITOT, PROT, ALBUMIN,  in the last 168 hours No results found for this basename: LIPASE, AMYLASE,  in the last 168 hours No results found for this basename: AMMONIA,  in the last 168 hours CBC:  Recent Labs Lab 03/12/14 1905 03/13/14 0815 03/14/14 0712  WBC 16.4* 15.9* 15.9*  NEUTROABS 14.6*  --   --   HGB 16.2 16.2 15.0  HCT 46.6 46.4 43.3  MCV 90.8 90.3 91.4  PLT 164 165 164   Cardiac Enzymes:  Recent Labs Lab 03/12/14 1905  TROPONINI <0.30   BNP (last 3 results)  Recent Labs  03/12/14 1938  PROBNP 222.2   CBG:  Recent Labs Lab 03/13/14 2149 03/14/14 0629 03/14/14 1143  GLUCAP 167* 121* 120*    Recent Results (from the past 240 hour(s))  CULTURE, BLOOD (ROUTINE X 2)     Status: None   Collection Time    03/12/14  8:08 PM      Result Value Ref Range Status   Specimen Description BLOOD LEFT ANTECUBITAL   Final   Special Requests     Final   Value: BOTTLES DRAWN AEROBIC AND ANAEROBIC 10CC BLUE 5CC RED   Culture  Setup Time     Final   Value: 03/13/2014 02:22     Performed at [facility]     Culture     Final   Value:        BLOOD CULTURE RECEIVED NO GROWTH TO DATE CULTURE WILL BE HELD FOR 5 DAYS BEFORE ISSUING A FINAL NEGATIVE REPORT     Performed at [facility]   Report Status PENDING   Incomplete  CULTURE, BLOOD (ROUTINE X 2)     Status: None   Collection Time    03/12/14  8:15 PM      Result Value Ref Range Status   Specimen Description BLOOD LEFT FOREARM   Final   Special Requests     Final   Value: BOTTLES DRAWN AEROBIC AND ANAEROBIC 6CC BLUE 5CC RED   Culture  Setup Time     Final   Value: 03/13/2014 02:22     Performed at [facility]   Culture     Final   Value:        BLOOD CULTURE RECEIVED NO GROWTH TO DATE CULTURE WILL BE HELD FOR 5 DAYS BEFORE ISSUING A FINAL NEGATIVE REPORT     Performed at [facility]   Report Status PENDING   Incomplete  CULTURE, EXPECTORATED SPUTUM-ASSESSMENT     Status: None   Collection Time    03/13/14  5:00 AM      Result Value Ref Range Status   Specimen Description SPUTUM   Final   Special Requests NONE   Final   Sputum evaluation     Final   Value: THIS SPECIMEN IS ACCEPTABLE. RESPIRATORY CULTURE REPORT TO FOLLOW.   Report Status 03/13/2014 FINAL   Final  GRAM STAIN     Status: None   Collection Time    03/13/14  5:00 AM      Result Value Ref Range Status   Specimen Description SPUTUM   Final   Special Requests NONE   Final   Gram Stain     Final   Value: SPUTUM     ABUNDANT WBC PRESENT,BOTH PMN AND MONONUCLEAR     RARE GRAM NEGATIVE RODS     RARE GRAM POSITIVE COCCI IN PAIRS   Report Status 03/13/2014 FINAL   Final  CULTURE, RESPIRATORY (NON-EXPECTORATED)     Status: None   Collection Time    03/13/14  5:00 AM      Result Value Ref Range Status   Specimen Description SPUTUM   Final   Special Requests NONE   Final   Gram Stain     Final   Value: ABUNDANT WBC PRESENT,BOTH PMN AND MONONUCLEAR     RARE SQUAMOUS EPITHELIAL CELLS PRESENT     RARE GRAM NEGATIVE RODS     RARE GRAM POSITIVE  COCCI     IN PAIRS   Culture     Final  Value: Culture reincubated for better growth     Performed at [facility]   Report Status PENDING   Incomplete       Studies: Dg Chest 2 View  03/12/2014   CLINICAL DATA:  Persistent cough  EXAM: CHEST  2 VIEW  COMPARISON:  Study obtained earlier in the day and March 02, 2013  FINDINGS: There is underlying emphysema. There is interstitial fibrosis in the mid and lower lung zones bilaterally. In comparison with the prior study from 2014, there is increased opacity in the left lower lobe and lingula ; a degree of interstitial pneumonitis superimposed on fibrotic change must be of concern.  Heart size is normal with pulmonary vascularity reflecting a degree of underlying emphysema. No adenopathy.  IMPRESSION: Suspect interstitial pneumonitis in the inferior lingula and left base superimposed on mid and lower lung zone interstitial fibrosis. There is underlying emphysema as well.   Electronically Signed   By: Lowella Grip [name]   On: 03/12/2014 21:13        Scheduled Meds: . azithromycin  500 mg Oral Daily  . cefTRIAXone (ROCEPHIN)  IV  1 g Intravenous Q24H  . doxazosin  2 mg Oral QODAY  . finasteride  2.5 mg Oral QODAY  . heparin  5,000 Units Subcutaneous 3 times per day  . insulin aspart  0-9 Units Subcutaneous TID WC  . pantoprazole  20 mg Oral QODAY  . predniSONE  40 mg Oral Q breakfast   Continuous Infusions:   Principal Problem:   CAP (community acquired pneumonia) Active Problems:   COPD with emphysema   Community acquired pneumonia   COPD exacerbation   Acute respiratory failure with hypoxia   Hyperglycemia    Time spent:  25 minutes    Evan Doyel, MD, FACP, FHM. [facility] Pager [phone]  If 7PM-7AM, please contact night-coverage [email or web address] Password [email or web address] 03/14/2014, 2:31 PM    LOS: 2 days

## 2014-03-15 LAB — CULTURE, RESPIRATORY

## 2014-03-15 LAB — GLUCOSE, CAPILLARY
Glucose-Capillary: 103 mg/dL — ABNORMAL HIGH (ref 70–99)
Glucose-Capillary: 108 mg/dL — ABNORMAL HIGH (ref 70–99)
Glucose-Capillary: 196 mg/dL — ABNORMAL HIGH (ref 70–99)

## 2014-03-15 LAB — CULTURE, RESPIRATORY W GRAM STAIN: Culture: NORMAL

## 2014-03-15 MED ORDER — PNEUMOCOCCAL VAC POLYVALENT 25 MCG/0.5ML IJ INJ: 0.5000 mL | INJECTION | INTRAMUSCULAR | Status: DC | PRN

## 2014-03-15 MED ORDER — PNEUMOCOCCAL VAC POLYVALENT 25 MCG/0.5ML IJ INJ: 0.5000 mL | INJECTION | INTRAMUSCULAR | Status: DC

## 2014-03-15 MED ORDER — PREDNISONE 10 MG PO TABS: ORAL_TABLET | ORAL | Status: DC

## 2014-03-15 MED ORDER — LEVOFLOXACIN 750 MG PO TABS: 750.0000 mg | ORAL_TABLET | Freq: Every day | ORAL | Status: DC

## 2014-03-15 NOTE — Progress Notes (Signed)
Patient d/c to home, IV removed, instructions reviewed and education regarding COPD and pneumonia given to patient and family.  O2 delivered, education given to patient and family.

## 2014-03-15 NOTE — Progress Notes (Addendum)
SATURATION QUALIFICATIONS: (This note is used to comply with regulatory documentation for home oxygen)  Patient Saturations on Room Air at Rest = 93%  Patient Saturations on Room Air while Ambulating = 87%  Patient Saturations on Room Air After Ambulating = 90%  Patient Saturations on 2L Oxygen After Ambulating = 92%

## 2014-03-15 NOTE — Discharge Summary (Signed)
Physician Discharge Summary  [name] [id number] DOB: Nov 12, 1927 DOA: 03/12/2014  PCP: Tula Nakayama   Admit date: 03/12/2014 Discharge date: 03/15/2014  Time spent: Less than 30 minutes  Recommendations for Outpatient Follow-up:  1. Bing Matter, PA-C/PCP in one week with repeat labs (CBC & BMP). Please follow final blood culture results that were sent from the hospital. 2. Dr. [name], pulmonology in one week 3. Oxygen via nasal cannula at 2 L per minute with activity 4. Recommend repeating chest x-ray in 4-6 weeks to ensure resolution of pneumonia findings  Discharge Diagnoses:  Principal Problem:   CAP (community acquired pneumonia) Active Problems:   COPD with emphysema   Community acquired pneumonia   COPD exacerbation   Acute respiratory failure with hypoxia   Hyperglycemia   Discharge Condition: Improved & Stable  Diet recommendation: Heart healthy diet  Filed Weights   03/12/14 2301  Weight: 65 kg (143 lb 4.8 oz)    History of present illness:  80 year old male with history of COPD a former smoker, not on home oxygen, GERD, BPH, hard of hearing, presented with 2 days history of productive cough, low-grade fevers and worsening dyspnea. In the ED, patient was found to be hypoxic and chest x-ray suggested pneumonia.  Hospital Course:  1. Community acquired pneumonia: Patient was treated empirically with IV Rocephin and azithromycin-completed 3 days. Sputum culture results were not helpful. Improving. Will transition to oral levofloxacin to complete total 7 days of antibiotic treatment. Will need followup chest x-rays in 4-6 weeks to ensure resolution of pneumonia findings. 2. COPD exacerbation: Precipitated by pneumonia. Continue treatment of pneumonia, oral steroids taper, oxygen and bronchodilators. 3. Acute respiratory failure with hypoxia: Secondary to pneumonia and COPD. Treatment as above. Patient continues to desaturate with activity. Will DC on  home oxygen which can be reassessed during outpatient visit. Patient may well have undiagnosed chronic respiratory failure from COPD and? ILD 4. Hyperglycemia without diagnosis of diabetes: Hemoglobin A1c: 6.2.  5. Leukocytosis: Likely secondary to pneumonia and steroids. Stable. Follow CBC as outpatient. 6. ?Interstitial lung disease: Outpatient followup with pulmonology    Consultations:  None  Procedures:  None    Discharge Exam:  Complaints:  Feels much better. Anxious to go home. Denies dyspnea and states that his breathing is back to baseline. Denies cough or chest pain.  Filed Vitals:   03/14/14 0830 03/14/14 1430 03/14/14 2147 03/15/14 0549  BP: 136/82 136/82 136/69 148/79  Pulse: 96 96 70 58  Temp: 97.5 F (36.4 C) 97.8 F (36.6 C) 98 F (36.7 C) 97.8 F (36.6 C)  TempSrc: Oral Oral Oral Oral  Resp: 16 16 16 16   Height:      Weight:      SpO2: 95% 95% 91% 94%    General exam: Pleasant elderly male sitting up in bed. Extremely hard of hearing.  Respiratory system: distant breath sounds but much improved compared to admission and clear to auscultation. No increased work of breathing.  Cardiovascular system: S1 & S2 heard, RRR. No JVD, murmurs, gallops, clicks or pedal edema.  Gastrointestinal system: Abdomen is nondistended, soft and nontender. Normal bowel sounds heard.  Central nervous system: Alert and oriented. No focal neurological deficits.  Extremities: Symmetric 5 x 5 power.   Discharge Instructions      Discharge Orders   Future Orders Complete By Expires   Call MD for:  difficulty breathing, headache or visual disturbances  As directed    Call MD for:  severe uncontrolled  pain  As directed    Call MD for:  temperature >100.4  As directed    Diet - low sodium heart healthy  As directed    Discharge instructions  As directed    Increase activity slowly  As directed        Medication List         acetaminophen 325 MG tablet  Commonly known  as:  TYLENOL  Take 650 mg by mouth every 6 (six) hours as needed for mild pain.     albuterol (2.5 MG/3ML) 0.083% nebulizer solution  Commonly known as:  PROVENTIL  Take 3 mLs (2.5 mg total) by nebulization every 6 (six) hours as needed for wheezing.     Chelated Zinc 50 MG Tabs  Take 25 mg by mouth every other day. Take 1/2 tablet by mouth every other day     dextromethorphan-guaiFENesin 30-600 MG per 12 hr tablet  Commonly known as:  MUCINEX DM  Take 1 tablet by mouth every 12 (twelve) hours as needed for cough.     doxazosin 8 MG tablet  Commonly known as:  CARDURA  Take 2 mg by mouth every other day.     finasteride 5 MG tablet  Commonly known as:  PROSCAR  Take 2.5 mg by mouth every other day.     Fluticasone-Salmeterol 100-50 MCG/DOSE Aepb  Commonly known as:  ADVAIR  Inhale 1 puff into the lungs daily.     ICAPS PO  Take 1 tablet by mouth 2 (two) times daily.     multivitamin with minerals tablet  Take 1 tablet by mouth every other day.     levofloxacin 750 MG tablet  Commonly known as:  LEVAQUIN  Take 1 tablet (750 mg total) by mouth daily.     pantoprazole 20 MG tablet  Commonly known as:  PROTONIX  Take 20 mg by mouth every other day.     predniSONE 10 MG tablet  Commonly known as:  DELTASONE  Take 3 tablets daily x3 days, then 2 tablets daily x3 days, then 1 tablet daily x3 days, then stop.     PROBIOTIC DAILY PO  Take 1 tablet by mouth daily.     vitamin C 500 MG tablet  Commonly known as:  ASCORBIC ACID  Take 500 mg by mouth daily.          The results of significant diagnostics from this hospitalization (including imaging, microbiology, ancillary and laboratory) are listed below for reference.    Significant Diagnostic Studies: Dg Chest 2 View  03/12/2014   CLINICAL DATA:  Persistent cough  EXAM: CHEST  2 VIEW  COMPARISON:  Study obtained earlier in the day and March 02, 2013  FINDINGS: There is underlying emphysema. There is interstitial  fibrosis in the mid and lower lung zones bilaterally. In comparison with the prior study from 2014, there is increased opacity in the left lower lobe and lingula ; a degree of interstitial pneumonitis superimposed on fibrotic change must be of concern.  Heart size is normal with pulmonary vascularity reflecting a degree of underlying emphysema. No adenopathy.  IMPRESSION: Suspect interstitial pneumonitis in the inferior lingula and left base superimposed on mid and lower lung zone interstitial fibrosis. There is underlying emphysema as well.   Electronically Signed   By: Lowella Grip [name]   On: 03/12/2014 21:13    Microbiology: Recent Results (from the past 240 hour(s))  CULTURE, BLOOD (ROUTINE X 2)     Status: None  Collection Time    03/12/14  8:08 PM      Result Value Ref Range Status   Specimen Description BLOOD LEFT ANTECUBITAL   Final   Special Requests     Final   Value: BOTTLES DRAWN AEROBIC AND ANAEROBIC 10CC BLUE 5CC RED   Culture  Setup Time     Final   Value: 03/13/2014 02:22     Performed at [facility]   Culture     Final   Value:        BLOOD CULTURE RECEIVED NO GROWTH TO DATE CULTURE WILL BE HELD FOR 5 DAYS BEFORE ISSUING A FINAL NEGATIVE REPORT     Performed at [facility]   Report Status PENDING   Incomplete  CULTURE, BLOOD (ROUTINE X 2)     Status: None   Collection Time    03/12/14  8:15 PM      Result Value Ref Range Status   Specimen Description BLOOD LEFT FOREARM   Final   Special Requests     Final   Value: BOTTLES DRAWN AEROBIC AND ANAEROBIC 6CC BLUE 5CC RED   Culture  Setup Time     Final   Value: 03/13/2014 02:22     Performed at [facility]   Culture     Final   Value:        BLOOD CULTURE RECEIVED NO GROWTH TO DATE CULTURE WILL BE HELD FOR 5 DAYS BEFORE ISSUING A FINAL NEGATIVE REPORT     Performed at [facility]   Report Status PENDING   Incomplete  CULTURE, EXPECTORATED SPUTUM-ASSESSMENT     Status: None    Collection Time    03/13/14  5:00 AM      Result Value Ref Range Status   Specimen Description SPUTUM   Final   Special Requests NONE   Final   Sputum evaluation     Final   Value: THIS SPECIMEN IS ACCEPTABLE. RESPIRATORY CULTURE REPORT TO FOLLOW.   Report Status 03/13/2014 FINAL   Final  GRAM STAIN     Status: None   Collection Time    03/13/14  5:00 AM      Result Value Ref Range Status   Specimen Description SPUTUM   Final   Special Requests NONE   Final   Gram Stain     Final   Value: SPUTUM     ABUNDANT WBC PRESENT,BOTH PMN AND MONONUCLEAR     RARE GRAM NEGATIVE RODS     RARE GRAM POSITIVE COCCI IN PAIRS   Report Status 03/13/2014 FINAL   Final  CULTURE, RESPIRATORY (NON-EXPECTORATED)     Status: None   Collection Time    03/13/14  5:00 AM      Result Value Ref Range Status   Specimen Description SPUTUM   Final   Special Requests NONE   Final   Gram Stain     Final   Value: ABUNDANT WBC PRESENT,BOTH PMN AND MONONUCLEAR     RARE SQUAMOUS EPITHELIAL CELLS PRESENT     RARE GRAM NEGATIVE RODS     RARE GRAM POSITIVE COCCI     IN PAIRS   Culture     Final   Value: NORMAL OROPHARYNGEAL FLORA     Performed at [facility]   Report Status 03/15/2014 FINAL   Final     Labs: Basic Metabolic Panel:  Recent Labs Lab 03/12/14 1905 03/13/14 0815  NA 135* 139  K 4.3 4.2  CL 97 97  CO2 23 23  GLUCOSE 150* 273*  BUN 11 17  CREATININE 0.81 0.79  CALCIUM 8.7 9.2   Liver Function Tests: No results found for this basename: AST, ALT, ALKPHOS, BILITOT, PROT, ALBUMIN,  in the last 168 hours No results found for this basename: LIPASE, AMYLASE,  in the last 168 hours No results found for this basename: AMMONIA,  in the last 168 hours CBC:  Recent Labs Lab 03/12/14 1905 03/13/14 0815 03/14/14 0712  WBC 16.4* 15.9* 15.9*  NEUTROABS 14.6*  --   --   HGB 16.2 16.2 15.0  HCT 46.6 46.4 43.3  MCV 90.8 90.3 91.4  PLT 164 165 164   Cardiac Enzymes:  Recent Labs Lab  03/12/14 1905  TROPONINI <0.30   BNP: BNP (last 3 results)  Recent Labs  03/12/14 1938  PROBNP 222.2   CBG:  Recent Labs Lab 03/14/14 1143 03/14/14 1625 03/14/14 2219 03/15/14 0642 03/15/14 1121  GLUCAP 120* 152* 113* 103* 108*     Signed:  Modena Jansky, MD, FACP, [facility]. [facility] Pager [phone]  If 7PM-7AM, please contact night-coverage [email or web address] Password [email or web address] 03/15/2014, 3:22 PM

## 2014-03-15 NOTE — Evaluation (Signed)
Physical Therapy Evaluation Patient Details Name: [name] MRN: [id number] DOB: Dec 09, 1926 Today's Date: 03/15/2014   History of Present Illness  80 year old male with history of COPD a former smoker, not on home oxygen, GERD, BPH, hard of hearing, presented with 2 days history of productive cough, low-grade fevers and worsening dyspnea. In the ED, patient was found to be hypoxic and chest x-ray suggested pneumonia.  Clinical Impression  Pt is at mod I level with all mobility, no further acute PT needs identified.  Pt gait on room air with spO2 87%, returns to 90% with < 1 minute seated rest.  Pt educated on energy conservation, he expresses understanding.  RN made aware of possible need for home O2.    Follow Up Recommendations No PT follow up    Equipment Recommendations  None recommended by PT (may need home O2)    Recommendations for Other Services       Precautions / Restrictions Restrictions Weight Bearing Restrictions: No      Mobility  Bed Mobility                  Transfers Overall transfer level: Modified independent                  Ambulation/Gait Ambulation/Gait assistance: Modified independent (Device/Increase time) Ambulation Distance (Feet): 200 Feet Assistive device: None       General Gait Details: slow cadence, no LOB.  spO2 during gait 87% on room air.  returns to 90% with < 1 minute seated rest.  RN made aware, pt may need home O2  Stairs            Wheelchair Mobility    Modified Rankin (Stroke Patients Only)       Balance                                             Pertinent Vitals/Pain No c/o pain    Home Living Family/patient expects to be discharged to:: Private residence Living Arrangements: Spouse/significant other Available Help at Discharge: Family Type of Home: House Home Access: [place]: One [place]: [place] - single point      Prior Function  Level of Independence: Independent         Comments[place]     Hand Dominance        Extremity/Trunk Assessment   Upper Extremity Assessment: Overall WFL for tasks assessed           Lower Extremity Assessment: Overall WFL for tasks assessed      Cervical / Trunk Assessment: Normal  Communication   Communication: HOH  Cognition Arousal/Alertness: Awake/alert Behavior During Therapy: WFL for tasks assessed/performed Overall Cognitive Status: Within Functional Limits for tasks assessed                      General Comments      Exercises        Assessment/Plan    PT Assessment Patent does not need any further PT services  PT Diagnosis     PT Problem List    PT Treatment Interventions     PT Goals (Current goals can be found in the Care Plan section) Acute Rehab PT Goals PT Goal Formulation: No goals set, d/c therapy  Frequency     Barriers to discharge        Co-evaluation               End of Session   Activity Tolerance: Patient tolerated treatment well Patient left: in chair;with call bell/phone within reach Nurse Communication: Mobility status         Time: 6761-9509 PT Time Calculation (min): 19 min   Charges:   PT Evaluation $Initial PT Evaluation Tier I: 1 Procedure PT Treatments $Therapeutic Activity: 8-22 mins   PT G Codes:          Kennith Gain 03/15/2014, 8:28 AM

## 2014-03-16 LAB — HIV ANTIBODY (ROUTINE TESTING W REFLEX): HIV: NONREACTIVE

## 2014-03-16 NOTE — Care Management Note (Signed)
CARE MANAGEMENT NOTE 03/16/2014  Patient:  [name], [name]   Account Number:  [id number]  Date Initiated:  03/15/2014  Documentation initiated by:  Ricki Miller  Subjective/Objective Assessment:   79 yr old male admitted with pneumonia, hx of COPD     Action/Plan:   Case Manager spoke with patient and daughter concerning need for oxygen at home and [facility]. Daughter wanted [facility]. Called referral. Oxygen had to be obtained through [employer].   Anticipated DC Date:  03/15/2014   Anticipated DC Plan:  [place]  CM consult      PAC Choice  [place]   Choice offered to / List presented to:  C-1 Patient   DME arranged  [facility]      DME agency  OTHER - SEE NOTE     HH arranged  HH-1 RN  [place].   Status of service:  Completed, signed off Medicare Important Message given?   (If response is "NO", the following Medicare IM given date fields will be blank) Date Medicare IM given:   Date Additional Medicare IM given:    Discharge Disposition:  [place]  Per UR Regulation:    If discussed at Long Length of Stay Meetings, dates discussed:    Comments:  03/15/14 5:28pm Ricki Miller, RN BSN Case Manager CM faxed order and demographics to [facility]:fax [phone], phone [phone]. Oxygen to  be delivered to patient's room.

## 2014-03-19 LAB — CULTURE, BLOOD (ROUTINE X 2)
CULTURE: NO GROWTH
Culture: NO GROWTH

## 2014-03-23 ENCOUNTER — Encounter: Payer: Self-pay | Admitting: Adult Health

## 2014-03-23 ENCOUNTER — Ambulatory Visit (INDEPENDENT_AMBULATORY_CARE_PROVIDER_SITE_OTHER): Payer: Medicare PPO | Admitting: Adult Health

## 2014-03-23 VITALS — BP 106/66 | HR 98 | Temp 97.6°F | Ht 66.0 in | Wt 146.6 lb

## 2014-03-23 DIAGNOSIS — J441 Chronic obstructive pulmonary disease with (acute) exacerbation: Secondary | ICD-10-CM

## 2014-03-23 DIAGNOSIS — J189 Pneumonia, unspecified organism: Secondary | ICD-10-CM

## 2014-03-23 NOTE — Assessment & Plan Note (Signed)
Cliinically improving - Repeat cxr in 2 weeks  If not improving consider CT chest as suspect ILD vs pneumonitis playing a major role here  Plan Finish Prednisone as directed.  Continue on Advair 1 puff Twice daily  , rinse after use Follow up Dr.Young in 2 weeks with chest xray  Wear Oxygen 2l/m continuously  Please contact office for sooner follow up if symptoms do not improve or worsen or seek emergency care.

## 2014-03-23 NOTE — Progress Notes (Signed)
Patient ID: [name], male    DOB: 1927-10-09, 80 y.o.   MRN: [id number] 10/11/10-HPI 51 yoM former smoker here with his wife for f/u of asthma and allergic rhinitis, complicated by COPD and GERD. Last here October 11, 2010- note reviewed. His memory is failing. No major problems since last here.  Has noted a little morning sneeze. Coughs a little at night. Throat clearing. Tries mucinex. Protonix has markedly improved his heartburn. He he has his HOB elevated and avoids steeply reclining his chair. He denies significant cough, wheeze or shortness of breath.  CT chest reviewed from January, 2012- tiny LLL nodule for f/u by Dr Mellody Drown in 1 year, COPD, CAD. Uses Advair once daily, sometimes twice. Qvar is listed, but not used.  CT 01/06/11- tiny LLL nodule, emphysema, CAD/ coronary calcification.   01/22/12- 21 yoM former smoker here with his wife for f/u of asthma and allergic rhinitis, complicated by COPD and GERD. Here with wife today. They both had flu shots. He has not had a significant respiratory infection over the last year. Not aware of shortness of breath. His wife pays more tension than he does. He only admits to cough if he eats chocolate. Occasional food hang up if he doesn't chew well but this is not painful or progressive. Denies obvious reflux or any impact on his breathing.  03/02/13- 52 yoM former smoker here with his wife for f/u of asthma and allergic rhinitis, complicated by COPD and GERD. FOLLOWS FOR: states breathing is doing good unless getting in rush.  Wife states patient stops breathing at night-doesnt happen often. Needs to have Advair changed to something cheaper-would like to look at using nebulizer instead He denies any dyspnea on exertion but his wife insists that he gives out of breath walking a short distance outside their front door and implies memory isn't very good. Had chest x-ray by his primary physician/Dr. Redmond Pulling one month ago after antibiotics for  bronchitis. He was told there was a "spot". A 3 mm left lower lobe nodule had first been mentioned on CT chest January 2012. CXR 01/26/12 IMPRESSION:  1. Grossly stable findings of advanced emphysematous change  without definite superimposed acute cardiopulmonary disease.  2. Previously identified 3 mm nodule within the left lower lobe is  not well evaluated on plain film radiography. Further evaluation  with a chest CT may be performed as clinically indicated.  Original Report Authenticated By: [name], [name]   03/23/2014 [facility] follow up  Patient returns for a post hospital followup. He was admitted April BS through April 8 for community-acquired pneumonia, and COPD, exacerbation. Patient was treated with IV antibiotics, discharged on Levaquin. Along with a prednisone taper. Patient did have desaturations persist during his hospital stay. Was discharged on oxygen therapy.  Doing well on O2 . sats adequate on 2l/m with walking and at rest . sats on RA 88% at rest .  CXR showed increased mid to lower lung interstitial fibrosis.  W/ increased areas in lingula and left base.  Reports breathing is improved since discharge, still having some prod cough with cream/green mucus.  done with Levaquin, still taking pred taper. Denies any hemoptysis, orthopnea, PND, leg swelling, or fever         ROS-see HPI Constitutional:   No-   weight loss, night sweats, fevers, chills,  +fatigue, lassitude. HEENT:   No-  headaches, difficulty swallowing, tooth/dental problems, sore throat,       No-  sneezing, itching, ear ache, nasal congestion, post nasal drip,  CV:  No-   chest pain, orthopnea, PND, swelling in lower extremities, anasarca, dizziness, palpitations Resp: No- acute  shortness of breath with exertion or at rest.              No-   productive cough,  No non-productive cough,  No- coughing up of blood.              No-   change in color of mucus.  No- wheezing.   Skin: No-    rash or lesions. GI:  No-   heartburn, indigestion, abdominal pain, nausea, vomiting,  GU: MS:  No-   joint pain or swelling.   Neuro-     nothing unusual Psych:  No- change in mood or affect. No depression or anxiety.  No memory loss.  Objective:  OBJ- Physical Exam GEN: A/Ox3; pleasant , NAD elderly,   HEENT:  [place]/AT,  EACs-clear, TMs-wnl, NOSE-clear, THROAT-clear, no lesions, no postnasal drip or exudate noted.   NECK:  Supple w/ fair ROM; no JVD; normal carotid impulses w/o bruits; no thyromegaly or nodules palpated; no lymphadenopathy.  RESP  Faint bibasilar rales noted.no accessory muscle use, no dullness to percussion  CARD:  RRR, no m/r/g  , tr  peripheral edema, pulses intact, no cyanosis or clubbing.  GI:   Soft & nt; nml bowel sounds; no organomegaly or masses detected.  Musco: Warm bil, no deformities or joint swelling noted.   Neuro: alert, no focal deficits noted.    Skin: Warm, no lesions or rashes

## 2014-03-23 NOTE — Patient Instructions (Addendum)
Finish Prednisone as directed.  Continue on Advair 1 puff Twice daily  , rinse after use Follow up Dr.Young in 2 weeks with chest xray  Wear Oxygen 2l/m continuously  Please contact office for sooner follow up if symptoms do not improve or worsen or seek emergency care.

## 2014-03-23 NOTE — Assessment & Plan Note (Signed)
Improving   Plan  Finish Prednisone as directed.  Continue on Advair 1 puff Twice daily  , rinse after use Follow up Dr.Young in 2 weeks with chest xray  Wear Oxygen 2l/m continuously  Please contact office for sooner follow up if symptoms do not improve or worsen or seek emergency care.

## 2014-04-06 ENCOUNTER — Encounter: Payer: Self-pay | Admitting: *Deleted

## 2014-04-06 ENCOUNTER — Encounter: Payer: Self-pay | Admitting: Internal Medicine

## 2014-04-06 ENCOUNTER — Ambulatory Visit (INDEPENDENT_AMBULATORY_CARE_PROVIDER_SITE_OTHER)
Admission: RE | Admit: 2014-04-06 | Discharge: 2014-04-06 | Disposition: A | Discharge status: Home / Self Care | Payer: Medicare PPO | Source: Ambulatory Visit | Attending: Internal Medicine | Admitting: Internal Medicine

## 2014-04-06 ENCOUNTER — Ambulatory Visit (INDEPENDENT_AMBULATORY_CARE_PROVIDER_SITE_OTHER): Payer: Medicare PPO | Admitting: Internal Medicine

## 2014-04-06 VITALS — BP 130/74 | HR 90 | Ht 66.0 in | Wt 145.8 lb

## 2014-04-06 DIAGNOSIS — R911 Solitary pulmonary nodule: Secondary | ICD-10-CM

## 2014-04-06 DIAGNOSIS — J189 Pneumonia, unspecified organism: Secondary | ICD-10-CM

## 2014-04-06 DIAGNOSIS — J449 Chronic obstructive pulmonary disease, unspecified: Secondary | ICD-10-CM

## 2014-04-06 DIAGNOSIS — J439 Emphysema, unspecified: Secondary | ICD-10-CM

## 2014-04-06 DIAGNOSIS — J438 Other emphysema: Secondary | ICD-10-CM

## 2014-04-06 DIAGNOSIS — Z23 Encounter for immunization: Secondary | ICD-10-CM

## 2014-04-06 NOTE — Progress Notes (Signed)
Patient ID: [name], male    DOB: April 02, 1927, 80 y.o.   MRN: [id number] 10/11/10-HPI 89 yoM former smoker here with his wife for f/u of asthma and allergic rhinitis, complicated by COPD and GERD. Last here October 11, 2010- note reviewed. His memory is failing. No major problems since last here.  Has noted a little morning sneeze. Coughs a little at night. Throat clearing. Tries mucinex. Protonix has markedly improved his heartburn. He he has his HOB elevated and avoids steeply reclining his chair. He denies significant cough, wheeze or shortness of breath.  CT chest reviewed from January, 2012- tiny LLL nodule for f/u by Dr Mellody Drown in 1 year, COPD, CAD. Uses Advair once daily, sometimes twice. Qvar is listed, but not used.  CT 01/06/11- tiny LLL nodule, emphysema, CAD/ coronary calcification.   01/22/12- 73 yoM former smoker here with his wife for f/u of asthma and allergic rhinitis, complicated by COPD and GERD. Here with wife today. They both had flu shots. He has not had a significant respiratory infection over the last year. Not aware of shortness of breath. His wife pays more tension than he does. He only admits to cough if he eats chocolate. Occasional food hang up if he doesn't chew well but this is not painful or progressive. Denies obvious reflux or any impact on his breathing.  03/02/13- 63 yoM former smoker here with his wife for f/u of asthma and allergic rhinitis, complicated by COPD and GERD. FOLLOWS FOR: states breathing is doing good unless getting in rush.  Wife states patient stops breathing at night-doesnt happen often. Needs to have Advair changed to something cheaper-would like to look at using nebulizer instead He denies any dyspnea on exertion but his wife insists that he gives out of breath walking a short distance outside their front door and implies memory isn't very good. Had chest x-ray by his primary physician/Dr. Redmond Pulling one month ago after antibiotics for  bronchitis. He was told there was a "spot". A 3 mm left lower lobe nodule had first been mentioned on CT chest January 2012. CXR 01/26/12 IMPRESSION:  1. Grossly stable findings of advanced emphysematous change  without definite superimposed acute cardiopulmonary disease.  2. Previously identified 3 mm nodule within the left lower lobe is  not well evaluated on plain film radiography. Further evaluation  with a chest CT may be performed as clinically indicated.  Original Report Authenticated By: [name], [name]  03/23/2014 [facility] follow up  Patient returns for a post hospital followup. He was admitted April BS through April 8 for community-acquired pneumonia, and COPD exacerbation. Patient was treated with IV antibiotics, discharged on Levaquin. Along with a prednisone taper. Patient did have desaturations persist during his hospital stay. Was discharged on oxygen therapy.  Doing well on O2 . sats adequate on 2l/m with walking and at rest . sats on RA 88% at rest .  CXR showed increased mid to lower lung interstitial fibrosis.  W/ increased areas in lingula and left base.  Reports breathing is improved since discharge, still having some prod cough with cream/green mucus.  done with Levaquin, still taking pred taper. Denies any hemoptysis, orthopnea, PND, leg swelling, or fever   04/06/14- 86 yoM  former smoker here with his wife for f/u of asthma and allergic rhinitis, complicated by COPD and GERD    Wife, daughter and granddaughter here ACUTE VISIT: CXR done today-  Daily cough still productive of yellow sputum but  no fever or chest pain. He has improved significantly since hospitalized in April with pneumonia. He has been using Advair once daily and avoiding use of nebulizer which he blamed on making him "sick". Home oxygen 2 L since last hospital stay. Ambulating on room air today drop saturation to 86%. He came with tank oxygen which is too heavy for him to manage. They ask  about Prevnar. CXR 04/06/14 IMPRESSION:  Stable bilateral basilar interstitial densities are noted compared  to prior exam, most consistent with pulmonary fibrosis or chronic  interstitial lung disease. Superimposed edema or pneumonia cannot be  excluded. Probable emphysematous changes noted in both upper lobes.  Electronically Signed  By: [name]  On: 04/06/2014 11:42  ROS-see HPI Constitutional:   No-   weight loss, night sweats, fevers, chills,  +fatigue, lassitude. HEENT:   No-  headaches, difficulty swallowing, tooth/dental problems, sore throat,       No-  sneezing, itching, ear ache, nasal congestion, post nasal drip,  CV:  No-   chest pain, orthopnea, PND, swelling in lower extremities, anasarca, dizziness, palpitations Resp: + shortness of breath with exertion or at rest.              No-   productive cough,  No non-productive cough,  No- coughing up of blood.              No-   change in color of mucus.  No- wheezing.   Skin: No-   rash or lesions. GI:  No-   heartburn, indigestion, abdominal pain, nausea, vomiting,  GU: MS:  No-   joint pain or swelling.   Neuro-     nothing unusual Psych:  No- change in mood or affect. No depression or anxiety.  ? memory loss.  Objective:   OBJ- Physical Exam General- Alert, Oriented, Affect-appropriate, Distress- none acute Skin- rash-none, lesions- none, excoriation- none Lymphadenopathy- none Head- atraumatic            Eyes- Gross vision intact, PERRLA, conjunctivae and secretions clear            Ears- +HOH            Nose- Clear, no-Septal dev, mucus, polyps, erosion, perforation             Throat- Mallampati II , mucosa clear , drainage- none, tonsils- atrophic Neck- flexible , trachea midline, no stridor , thyroid nl, carotid no bruit Chest - symmetrical excursion , unlabored           Heart/CV- +RRR with extra beats , no murmur , no gallop  , no rub, nl s1 s2                           - JVD+1cm , edema- none,  stasis changes- none, varices- none           Lung- +fine crackles or rales in bases, or , wheeze- none, cough- none , dullness-none, rub- none           Chest wall-  Abd-  Br/ Gen/ Rectal- Not done, not indicated Extrem- cyanosis- none, clubbing, none, atrophy- none, strength- nl Neuro- grossly intact to observation

## 2014-04-06 NOTE — Patient Instructions (Addendum)
Use your Advair  1 puff then rinse your mouth, TWICE DAILY   See if the shortness of breath is any better doing it this way.  Prevnar pneumonia vaccine- This is only given one time  Order- DME [facility]  Already has O2.   Need them to evaluate for light portable O2 2L/min       Ambulatory O2 today on room air was 86%   Dx COPD

## 2014-04-06 NOTE — Assessment & Plan Note (Signed)
Severe emphysema with a fibrotic component. Watch for mild CHF. Plan-the saturation on room air walk test today supports adding portable oxygen with request for light portable evaluation. Prevnar. Using Advair twice daily.

## 2014-04-06 NOTE — Assessment & Plan Note (Signed)
X-ray slowly clearing. There may be scarring/fibrosis/edema as well but he is not in frank heart failure.

## 2014-04-06 NOTE — Assessment & Plan Note (Signed)
Remains too small to be seen on subsequent chest x-rays

## 2014-04-11 ENCOUNTER — Telehealth: Payer: Self-pay | Admitting: Internal Medicine

## 2014-04-11 NOTE — Telephone Encounter (Signed)
Could you guys refax this order? Thanks ladies!!

## 2014-04-12 NOTE — Telephone Encounter (Signed)
refaxed to [facility] [place]

## 2014-04-28 ENCOUNTER — Ambulatory Visit: Payer: Medicare PPO | Admitting: Internal Medicine

## 2014-07-04 ENCOUNTER — Telehealth: Payer: Self-pay | Admitting: Internal Medicine

## 2014-07-04 DIAGNOSIS — J439 Emphysema, unspecified: Secondary | ICD-10-CM

## 2014-07-04 NOTE — Telephone Encounter (Signed)
Oxygen routinely will need to include back-up tanks incase power outage stops his concetrator. [facility] d/c portable O2.

## 2014-07-04 NOTE — Telephone Encounter (Signed)
Spoke with pt daughter Grizzell Pt is not using Portable O2 oxygen and would like for this to be picked up. Has not used the portable tanks x 2 months. Requests to keep the home concentrator. Requesting an order to have portable O2 picked up Pt uses [place] fax [phone]  Please advise Dr Annamaria Boots on this order. Thanks.

## 2014-07-04 NOTE — Telephone Encounter (Signed)
Called and spoke with pts daughter and she stated that the pt wants all the portable tanks picked up.  She stated that he has been trying to wean himself off the oxygen and has not used the portable tanks in over 3 months.  Pt wanted all the tanks picked up. Order has been sent to [facility].

## 2014-08-03 ENCOUNTER — Ambulatory Visit: Payer: Medicare PPO | Admitting: Internal Medicine

## 2014-08-17 ENCOUNTER — Ambulatory Visit (INDEPENDENT_AMBULATORY_CARE_PROVIDER_SITE_OTHER): Payer: Medicare PPO | Admitting: Internal Medicine

## 2014-08-17 ENCOUNTER — Encounter: Payer: Self-pay | Admitting: Internal Medicine

## 2014-08-17 VITALS — BP 152/90 | HR 82 | Ht 66.0 in | Wt 151.2 lb

## 2014-08-17 DIAGNOSIS — J449 Chronic obstructive pulmonary disease, unspecified: Secondary | ICD-10-CM

## 2014-08-17 DIAGNOSIS — J439 Emphysema, unspecified: Secondary | ICD-10-CM

## 2014-08-17 DIAGNOSIS — IMO0001 Reserved for inherently not codable concepts without codable children: Secondary | ICD-10-CM

## 2014-08-17 DIAGNOSIS — J309 Allergic rhinitis, unspecified: Secondary | ICD-10-CM

## 2014-08-17 DIAGNOSIS — J4489 Other specified chronic obstructive pulmonary disease: Secondary | ICD-10-CM

## 2014-08-17 DIAGNOSIS — J438 Other emphysema: Secondary | ICD-10-CM

## 2014-08-17 NOTE — Assessment & Plan Note (Signed)
Seasonal rhinitis or a very mild cold- symptomatic Rx

## 2014-08-17 NOTE — Progress Notes (Signed)
Patient ID: [name], male    DOB: April 02, 1927, 80 y.o.   MRN: [id number] 10/11/10-HPI 89 yoM former smoker here with his wife for f/u of asthma and allergic rhinitis, complicated by COPD and GERD. Last here October 11, 2010- note reviewed. His memory is failing. No major problems since last here.  Has noted a little morning sneeze. Coughs a little at night. Throat clearing. Tries mucinex. Protonix has markedly improved his heartburn. He he has his HOB elevated and avoids steeply reclining his chair. He denies significant cough, wheeze or shortness of breath.  CT chest reviewed from January, 2012- tiny LLL nodule for f/u by Dr Mellody Drown in 1 year, COPD, CAD. Uses Advair once daily, sometimes twice. Qvar is listed, but not used.  CT 01/06/11- tiny LLL nodule, emphysema, CAD/ coronary calcification.   01/22/12- 73 yoM former smoker here with his wife for f/u of asthma and allergic rhinitis, complicated by COPD and GERD. Here with wife today. They both had flu shots. He has not had a significant respiratory infection over the last year. Not aware of shortness of breath. His wife pays more tension than he does. He only admits to cough if he eats chocolate. Occasional food hang up if he doesn't chew well but this is not painful or progressive. Denies obvious reflux or any impact on his breathing.  03/02/13- 63 yoM former smoker here with his wife for f/u of asthma and allergic rhinitis, complicated by COPD and GERD. FOLLOWS FOR: states breathing is doing good unless getting in rush.  Wife states patient stops breathing at night-doesnt happen often. Needs to have Advair changed to something cheaper-would like to look at using nebulizer instead He denies any dyspnea on exertion but his wife insists that he gives out of breath walking a short distance outside their front door and implies memory isn't very good. Had chest x-ray by his primary physician/Dr. Redmond Pulling one month ago after antibiotics for  bronchitis. He was told there was a "spot". A 3 mm left lower lobe nodule had first been mentioned on CT chest January 2012. CXR 01/26/12 IMPRESSION:  1. Grossly stable findings of advanced emphysematous change  without definite superimposed acute cardiopulmonary disease.  2. Previously identified 3 mm nodule within the left lower lobe is  not well evaluated on plain film radiography. Further evaluation  with a chest CT may be performed as clinically indicated.  Original Report Authenticated By: [name], [name]  03/23/2014 [facility] follow up  Patient returns for a post hospital followup. He was admitted April BS through April 8 for community-acquired pneumonia, and COPD exacerbation. Patient was treated with IV antibiotics, discharged on Levaquin. Along with a prednisone taper. Patient did have desaturations persist during his hospital stay. Was discharged on oxygen therapy.  Doing well on O2 . sats adequate on 2l/m with walking and at rest . sats on RA 88% at rest .  CXR showed increased mid to lower lung interstitial fibrosis.  W/ increased areas in lingula and left base.  Reports breathing is improved since discharge, still having some prod cough with cream/green mucus.  done with Levaquin, still taking pred taper. Denies any hemoptysis, orthopnea, PND, leg swelling, or fever   04/06/14- 86 yoM  former smoker here with his wife for f/u of asthma and allergic rhinitis, complicated by COPD and GERD    Wife, daughter and granddaughter here ACUTE VISIT: CXR done today-  Daily cough still productive of yellow sputum but  no fever or chest pain. He has improved significantly since hospitalized in April with pneumonia. He has been using Advair once daily and avoiding use of nebulizer which he blamed on making him "sick". Home oxygen 2 L since last hospital stay. Ambulating on room air today drop saturation to 86%. He came with tank oxygen which is too heavy for him to manage. They ask  about Prevnar. CXR 04/06/14 IMPRESSION:  Stable bilateral basilar interstitial densities are noted compared  to prior exam, most consistent with pulmonary fibrosis or chronic  interstitial lung disease. Superimposed edema or pneumonia cannot be  excluded. Probable emphysematous changes noted in both upper lobes.  Electronically Signed  By: [name]  On: 04/06/2014 11:42  08/17/14- 44 yoM  former smoker here with his wife for f/u of asthma and allergic rhinitis, complicated by COPD and GERD  FOLLOWS FOR:c/o sorethroat since yesterday,hoarseness,took Zyrtec,cough-white,sob same,pnd,no fcs              Wife, daughter here They bring record listing O2 room air sat about 97% every day. He puts O2 on for 1 hour daily. [facility]2 [facility] recent runny nose adn mild sore throat with clear sputum.  ROS-see HPI Constitutional:   No-   weight loss, night sweats, fevers, chills,  +fatigue, lassitude. HEENT:   No-  headaches, difficulty swallowing, tooth/dental problems, sore throat,       No-  sneezing, itching, ear ache, nasal congestion, +post nasal drip,  CV:  No-   chest pain, orthopnea, PND, swelling in lower extremities, anasarca, dizziness, palpitations Resp: + shortness of breath with exertion or at rest.              + productive cough,  + non-productive cough,  No- coughing up of blood.              No-   change in color of mucus.  No- wheezing.   Skin: No-   rash or lesions. GI:  No-   heartburn, indigestion, abdominal pain, nausea, vomiting,  GU: MS:  No-   joint pain or swelling.   Neuro-     nothing unusual Psych:  No- change in mood or affect. No depression or anxiety.  ? memory loss.  Objective:   OBJ- Physical Exam General- Alert, Oriented, Affect-appropriate, Distress- none acute Skin- rash-none, lesions- none, excoriation- none Lymphadenopathy- none Head- atraumatic            Eyes- Gross vision intact, PERRLA, conjunctivae and secretions clear             Ears- +HOH            Nose- Clear, no-Septal dev, mucus, polyps, erosion, perforation             Throat- Mallampati II , mucosa clear , drainage- none, tonsils- atrophic Neck- flexible , trachea midline, no stridor , thyroid nl, carotid no bruit Chest - symmetrical excursion , unlabored           Heart/CV- +RRR with extra beats , no murmur , no gallop  , no rub, nl s1 s2                           - JVD+1cm , edema- none, stasis changes- none, varices- none           Lung- +coarse, unlabored oral , wheeze- none, cough- none , dullness-none, rub- none  Chest wall-  Abd-  Br/ Gen/ Rectal- Not done, not indicated Extrem- cyanosis- none, clubbing, none, atrophy- none, strength- nl Neuro- grossly intact to observation

## 2014-08-17 NOTE — Patient Instructions (Signed)
Order- [facility] room air    Dx COPD with bronchitis  Get your flu shot as soon as you are feeling better

## 2014-08-17 NOTE — Assessment & Plan Note (Signed)
Oxygenation seems better and we may be able to d/c or change order.  Plan- [facility] room air

## 2014-09-11 ENCOUNTER — Emergency Department (HOSPITAL_COMMUNITY)
Admission: EM | Admit: 2014-09-11 | Discharge: 2014-09-12 | Disposition: A | Discharge status: Home / Self Care | Payer: Medicare PPO | Attending: Emergency Medicine | Admitting: Emergency Medicine

## 2014-09-11 ENCOUNTER — Encounter (HOSPITAL_COMMUNITY): Payer: Self-pay | Admitting: Emergency Medicine

## 2014-09-11 ENCOUNTER — Telehealth: Payer: Self-pay | Admitting: Internal Medicine

## 2014-09-11 DIAGNOSIS — R4182 Altered mental status, unspecified: Secondary | ICD-10-CM | POA: Insufficient documentation

## 2014-09-11 DIAGNOSIS — Z87891 Personal history of nicotine dependence: Secondary | ICD-10-CM | POA: Diagnosis not present

## 2014-09-11 DIAGNOSIS — K219 Gastro-esophageal reflux disease without esophagitis: Secondary | ICD-10-CM | POA: Diagnosis not present

## 2014-09-11 DIAGNOSIS — J189 Pneumonia, unspecified organism: Secondary | ICD-10-CM

## 2014-09-11 DIAGNOSIS — Z79899 Other long term (current) drug therapy: Secondary | ICD-10-CM | POA: Insufficient documentation

## 2014-09-11 DIAGNOSIS — J439 Emphysema, unspecified: Secondary | ICD-10-CM | POA: Diagnosis not present

## 2014-09-11 DIAGNOSIS — Z7951 Long term (current) use of inhaled steroids: Secondary | ICD-10-CM | POA: Insufficient documentation

## 2014-09-11 DIAGNOSIS — J159 Unspecified bacterial pneumonia: Secondary | ICD-10-CM | POA: Diagnosis not present

## 2014-09-11 DIAGNOSIS — R05 Cough: Secondary | ICD-10-CM | POA: Diagnosis present

## 2014-09-11 DIAGNOSIS — Z87438 Personal history of other diseases of male genital organs: Secondary | ICD-10-CM | POA: Diagnosis not present

## 2014-09-11 NOTE — Telephone Encounter (Signed)
Pt hard of hearing and gave verbal authorization to speak with his daughter about the results. She is aware that patient needs to continue using his O2 at 2L/M QHS-he has his concentrator at home and will continue using at night and with any naps.

## 2014-09-11 NOTE — ED Notes (Signed)
Pt complains of a cough and fever for three weeks, family states that he's been having altered mental status especially at night.

## 2014-09-12 ENCOUNTER — Telehealth: Payer: Self-pay | Admitting: Internal Medicine

## 2014-09-12 ENCOUNTER — Emergency Department (HOSPITAL_COMMUNITY): Payer: Medicare PPO

## 2014-09-12 LAB — CBC WITH DIFFERENTIAL/PLATELET
BASOS PCT: 0 % (ref 0–1)
Basophils Absolute: 0 10*3/uL (ref 0.0–0.1)
Eosinophils Absolute: 0.1 10*3/uL (ref 0.0–0.7)
Eosinophils Relative: 0 % (ref 0–5)
HEMATOCRIT: 47.4 % (ref 39.0–52.0)
HEMOGLOBIN: 16.4 g/dL (ref 13.0–17.0)
LYMPHS ABS: 2 10*3/uL (ref 0.7–4.0)
Lymphocytes Relative: 12 % (ref 12–46)
MCH: 30.3 pg (ref 26.0–34.0)
MCHC: 34.6 g/dL (ref 30.0–36.0)
MCV: 87.5 fL (ref 78.0–100.0)
MONO ABS: 1.1 10*3/uL — AB (ref 0.1–1.0)
MONOS PCT: 7 % (ref 3–12)
NEUTROS PCT: 81 % — AB (ref 43–77)
Neutro Abs: 13.4 10*3/uL — ABNORMAL HIGH (ref 1.7–7.7)
Platelets: 184 10*3/uL (ref 150–400)
RBC: 5.42 MIL/uL (ref 4.22–5.81)
RDW: 13.4 % (ref 11.5–15.5)
WBC: 16.6 10*3/uL — ABNORMAL HIGH (ref 4.0–10.5)

## 2014-09-12 LAB — BASIC METABOLIC PANEL
Anion gap: 14 (ref 5–15)
BUN: 12 mg/dL (ref 6–23)
CO2: 22 mEq/L (ref 19–32)
CREATININE: 0.87 mg/dL (ref 0.50–1.35)
Calcium: 9.3 mg/dL (ref 8.4–10.5)
Chloride: 97 mEq/L (ref 96–112)
GFR calc non Af Amer: 77 mL/min — ABNORMAL LOW (ref >90)
GFR, EST AFRICAN AMERICAN: 89 mL/min — AB (ref >90)
GLUCOSE: 132 mg/dL — AB (ref 70–99)
POTASSIUM: 4.1 meq/L (ref 3.7–5.3)
Sodium: 133 mEq/L — ABNORMAL LOW (ref 137–147)

## 2014-09-12 LAB — URINALYSIS, ROUTINE W REFLEX MICROSCOPIC
BILIRUBIN URINE: NEGATIVE
GLUCOSE, UA: NEGATIVE mg/dL
HGB URINE DIPSTICK: NEGATIVE
Ketones, ur: NEGATIVE mg/dL
Leukocytes, UA: NEGATIVE
Nitrite: NEGATIVE
Protein, ur: NEGATIVE mg/dL
SPECIFIC GRAVITY, URINE: 1.007 (ref 1.005–1.030)
Urobilinogen, UA: 0.2 mg/dL (ref 0.0–1.0)
pH: 7 (ref 5.0–8.0)

## 2014-09-12 LAB — I-STAT CG4 LACTIC ACID, ED: Lactic Acid, Venous: 0.91 mmol/L (ref 0.5–2.2)

## 2014-09-12 MED ORDER — PREDNISONE 20 MG PO TABS: 20.0000 mg | ORAL_TABLET | Freq: Every day | ORAL | Status: DC

## 2014-09-12 MED ORDER — LEVOFLOXACIN 500 MG PO TABS
500.0000 mg | ORAL_TABLET | Freq: Once | ORAL | Status: AC
  Administered 2014-09-12: 500 mg via ORAL
  Filled 2014-09-12: qty 1

## 2014-09-12 MED ORDER — PROMETHAZINE-CODEINE 6.25-10 MG/5ML PO SYRP: 5.0000 mL | ORAL_SOLUTION | Freq: Four times a day (QID) | ORAL | Status: DC | PRN

## 2014-09-12 MED ORDER — LEVOFLOXACIN 500 MG PO TABS: 500.0000 mg | ORAL_TABLET | Freq: Every day | ORAL | Status: DC

## 2014-09-12 NOTE — Telephone Encounter (Signed)
Offer:  Z pak,  Rx prednisone 20 mg, # 5, 1 daily x 5 days, Prometh codeine cough syrup, # 180 ml,  5 ml every 8 hours as needed for cough

## 2014-09-12 NOTE — ED Notes (Signed)
Pt ambulates well independently.

## 2014-09-12 NOTE — Discharge Instructions (Signed)
Return to the ER for worsening cough, fevers, or other new concerning symptoms.  Wearing your oxygen more during the day and night may help while you are ill.  Your chest xray shows scarring and possible early pneumonia.  Please take antibiotics as prescribed.   Pneumonia Pneumonia is an infection of the lungs.  CAUSES Pneumonia may be caused by bacteria or a virus. Usually, these infections are caused by breathing infectious particles into the lungs (respiratory tract). SIGNS AND SYMPTOMS   Cough.  Fever.  Chest pain.  Increased rate of breathing.  Wheezing.  Mucus production. DIAGNOSIS  If you have the common symptoms of pneumonia, your health care provider will typically confirm the diagnosis with a chest X-ray. The X-ray will show an abnormality in the lung (pulmonary infiltrate) if you have pneumonia. Other tests of your blood, urine, or sputum may be done to find the specific cause of your pneumonia. Your health care provider may also do tests (blood gases or pulse oximetry) to see how well your lungs are working. TREATMENT  Some forms of pneumonia may be spread to other people when you cough or sneeze. You may be asked to wear a mask before and during your exam. Pneumonia that is caused by bacteria is treated with antibiotic medicine. Pneumonia that is caused by the influenza virus may be treated with an antiviral medicine. Most other viral infections must run their course. These infections will not respond to antibiotics.  HOME CARE INSTRUCTIONS   Cough suppressants may be used if you are losing too much rest. However, coughing protects you by clearing your lungs. You should avoid using cough suppressants if you can.  Your health care provider may have prescribed medicine if he or she thinks your pneumonia is caused by bacteria or influenza. Finish your medicine even if you start to feel better.  Your health care provider may also prescribe an expectorant. This loosens the mucus  to be coughed up.  Take medicines only as directed by your health care provider.  Do not smoke. Smoking is a common cause of bronchitis and can contribute to pneumonia. If you are a smoker and continue to smoke, your cough may last several weeks after your pneumonia has cleared.  A cold steam vaporizer or humidifier in your room or home may help loosen mucus.  Coughing is often worse at night. Sleeping in a semi-upright position in a recliner or using a couple pillows under your head will help with this.  Get rest as you feel it is needed. Your body will usually let you know when you need to rest. PREVENTION A pneumococcal shot (vaccine) is available to prevent a common bacterial cause of pneumonia. This is usually suggested for:  People over 69 years old.  Patients on chemotherapy.  People with chronic lung problems, such as bronchitis or emphysema.  People with immune system problems. If you are over 65 or have a high risk condition, you may receive the pneumococcal vaccine if you have not received it before. In some countries, a routine influenza vaccine is also recommended. This vaccine can help prevent some cases of pneumonia.You may be offered the influenza vaccine as part of your care. If you smoke, it is time to quit. You may receive instructions on how to stop smoking. Your health care provider can provide medicines and counseling to help you quit. SEEK MEDICAL CARE IF: You have a fever. SEEK IMMEDIATE MEDICAL CARE IF:   Your illness becomes worse. This is especially  true if you are elderly or weakened from any other disease.  You cannot control your cough with suppressants and are losing sleep.  You begin coughing up blood.  You develop pain which is getting worse or is uncontrolled with medicines.  Any of the symptoms which initially brought you in for treatment are getting worse rather than better.  You develop shortness of breath or chest pain. MAKE SURE YOU:    Understand these instructions.  Will watch your condition.  Will get help right away if you are not doing well or get worse. Document Released: 11/24/2005 Document Revised: 04/10/2014 Document Reviewed: 02/13/2011 [facility] Patient Information 2015 [place], [place]. This information is not intended to replace advice given to you by your health care provider. Make sure you discuss any questions you have with your health care provider.  Chronic Obstructive Pulmonary Disease Exacerbation  Chronic obstructive pulmonary disease (COPD) is a common lung problem. In COPD, the flow of air from the lungs is limited. COPD exacerbations are times that breathing gets worse and you need extra treatment. Without treatment they can be life threatening. If they happen often, your lungs can become more damaged. HOME CARE  Do not smoke.  Avoid tobacco smoke and other things that bother your lungs.  If given, take your antibiotic medicine as told. Finish the medicine even if you start to feel better.  Only take medicines as told by your doctor.  Drink enough fluids to keep your pee (urine) clear or pale yellow (unless your doctor has told you not to).  Use a cool mist machine (vaporizer).  If you use oxygen or a machine that turns liquid medicine into a mist (nebulizer), continue to use them as told.  Keep up with shots (vaccinations) as told by your doctor.  Exercise regularly.  Eat healthy foods.  Keep all doctor visits as told. GET HELP RIGHT AWAY IF:  You are very short of breath and it gets worse.  You have trouble talking.  You have bad chest pain.  You have blood in your spit (sputum).  You have a fever.  You keep throwing up (vomiting).  You feel weak, or you pass out (faint).  You feel confused.  You keep getting worse. MAKE SURE YOU:   Understand these instructions.  Will watch your condition.  Will get help right away if you are not doing well or get  worse. Document Released: 11/13/2011 Document Revised: 09/14/2013 Document Reviewed: 07/29/2013 [facility] Patient Information 2015 [place], [place]. This information is not intended to replace advice given to you by your health care provider. Make sure you discuss any questions you have with your health care provider.

## 2014-09-12 NOTE — Telephone Encounter (Signed)
Pt daughter states that pt was taken to ED last night  for dizziness, confusion and fever (100.5).  CXR done-- showed scar tissue in lungs vs possible PNA Labs collected-- White blood cells are elevated Levaquin 500mg  1POQD x 10days States that the patient is coughing and has dizziness this AM; Concerned with possible inner ear? Daughter states that this all started as allergies with sneezing, cough and runny nose-- [place] and the [place] fields were mowed around the time his symptoms started.  Daughter states that they were advised to f/u with CY before the weekend-- pt unable to travel the long distance d/t current symptoms (pt lives 1 hr away)--requests rec's via telephone. Requests cough syrup that can be called into pharmacy.   Allergies  Allergen Reactions  . Tape Rash and Other (See Comments)    Pulls off skin   Please advise Dr Annamaria Boots. Thanks.   Current Outpatient Prescriptions on File Prior to Visit  Medication Sig Dispense Refill  . acetaminophen (TYLENOL) 325 MG tablet Take 650 mg by mouth every 6 (six) hours as needed for mild pain.       . Ascorbic Acid (VITAMIN C) 500 MG tablet Take 500 mg by mouth daily.        . brimonidine (ALPHAGAN) 0.15 % ophthalmic solution Place 1 drop into both eyes 2 (two) times daily.      . cetirizine (ZYRTEC) 10 MG tablet Take 10 mg by mouth daily.      Marland Kitchen co-enzyme Q-10 30 MG capsule Take 30 mg by mouth daily.      Marland Kitchen doxazosin (CARDURA) 8 MG tablet Take 2 mg by mouth every other day.      . finasteride (PROSCAR) 5 MG tablet Take 2.5 mg by mouth every other day.      . Fluticasone-Salmeterol (ADVAIR) 100-50 MCG/DOSE AEPB Inhale 1 puff into the lungs 2 (two) times daily.       Marland Kitchen gabapentin (NEURONTIN) 100 MG capsule Take 100 mg by mouth at bedtime.      Marland Kitchen guaiFENesin (MUCINEX) 600 MG 12 hr tablet Take 600 mg by mouth 2 (two) times daily.      Marland Kitchen levofloxacin (LEVAQUIN) 500 MG tablet Take 1 tablet (500 mg total) by mouth daily.  10 tablet  0  .  Multiple Vitamins-Minerals (ICAPS PO) Take 1 tablet by mouth 2 (two) times daily.      . Multiple Vitamins-Minerals (MULTIVITAMIN WITH MINERALS) tablet Take 1 tablet by mouth every other day.      . Omega-3 Fatty Acids (FISH OIL) 1000 MG CAPS Take by mouth. Take 1 capsule daily by mouth      . pantoprazole (PROTONIX) 20 MG tablet Take 20 mg by mouth every other day.      . vitamin E 100 UNIT capsule Take 100 Units by mouth. Take by mouth twice a day      . zinc sulfate 220 MG capsule Take 220 mg by mouth daily.       No current facility-administered medications on file prior to visit.

## 2014-09-12 NOTE — ED Provider Notes (Signed)
CSN: [id number]     Arrival date & time 09/11/14  2255 History   First MD Initiated Contact with Patient 09/11/14 2305     Chief Complaint  Patient presents with  . Cough  . Altered Mental Status     (Consider location/radiation/quality/duration/timing/severity/associated sxs/prior Treatment) HPI 80 year old male presents to emergency room from home with complaint of fever and confusion tonight.  Family reports 2-3 weeks of right nose sneezing and cough.  Tonight, patient went to bed around 9:30.  When wife went to bed, patient was confused and not making much sense.  Temp at home 100.5.  Patient's been taking this in next 4 his congestion.  Patient has history of COPD, wears oxygen only at night.  Patient reports he has not been wearing it at night only a few hours in the morning because it causes him to have nose bleeds.  Patient quit smoking 30 years ago.  Patient with history of reflux BPH and chronic skin cancer of the ears. Past Medical History  Diagnosis Date  . Allergic rhinitis, cause unspecified   . Asthma   . Chronic airway obstruction, not elsewhere classified   . Esophageal reflux   . BPH (benign prostatic hyperplasia)    Past Surgical History  Procedure Laterality Date  . Turp vaporization    . Vasectomy     Family History  Problem Relation Age of Onset  . Cancer      both parents   History  Substance Use Topics  . Smoking status: Former Smoker -- 2.00 packs/day for 50 years    Types: Cigarettes    Quit date: 12/09/1991  . Smokeless tobacco: Not on file  . Alcohol Use: Yes     Comment: occasional glass of red wine    Review of Systems   See History of Present Illness; otherwise all other systems are reviewed and negative  Allergies  Tape  Home Medications   Prior to Admission medications   Medication Sig Start Date End Date Taking? Authorizing Provider  acetaminophen (TYLENOL) 325 MG tablet Take 650 mg by mouth every 6 (six) hours as needed for mild  pain.     Historical Provider, MD  Ascorbic Acid (VITAMIN C) 500 MG tablet Take 500 mg by mouth daily.      Historical Provider, MD  cetirizine (ZYRTEC) 10 MG tablet Take 10 mg by mouth daily.    Historical Provider, MD  co-enzyme Q-10 30 MG capsule Take 30 mg by mouth daily.    Historical Provider, MD  doxazosin (CARDURA) 8 MG tablet Take 2 mg by mouth every other day.    Historical Provider, MD  finasteride (PROSCAR) 5 MG tablet Take 2.5 mg by mouth every other day.    Historical Provider, MD  Fluticasone-Salmeterol (ADVAIR) 100-50 MCG/DOSE AEPB Inhale 1 puff into the lungs 2 (two) times daily.     Historical Provider, MD  gabapentin (NEURONTIN) 100 MG capsule Take 100 mg by mouth at bedtime.    Historical Provider, MD  guaiFENesin (MUCINEX) 600 MG 12 hr tablet Take 600 mg by mouth 2 (two) times daily.    Historical Provider, MD  Multiple Vitamins-Minerals (ICAPS PO) Take 1 tablet by mouth 2 (two) times daily.    Historical Provider, MD  Multiple Vitamins-Minerals (MULTIVITAMIN WITH MINERALS) tablet Take 1 tablet by mouth every other day.    Historical Provider, MD  Omega-3 Fatty Acids (FISH OIL) 1000 MG CAPS Take by mouth. Take 1 capsule daily by mouth  Historical Provider, MD  pantoprazole (PROTONIX) 20 MG tablet Take 20 mg by mouth every other day.    Historical Provider, MD  Probiotic Product (PROBIOTIC DAILY PO) Take 1 tablet by mouth daily.    Historical Provider, MD  vitamin E 100 UNIT capsule Take 100 Units by mouth. Take by mouth twice a day    Historical Provider, MD  zinc sulfate 220 MG capsule Take 220 mg by mouth daily.    Historical Provider, MD   BP 145/79  Pulse 92  Temp(Src) 98.7 F (37.1 C) (Oral)  Resp 18  SpO2 90% Physical Exam  Nursing note and vitals reviewed. Constitutional: He is oriented to person, place, and time. He appears well-developed and well-nourished. No distress.  HENT:  Head: Normocephalic and atraumatic.  Nose: Nose normal.  Mouth/Throat:  Oropharynx is clear and moist.  Eyes: Conjunctivae and EOM are normal. Pupils are equal, round, and reactive to light.   Subconjunctival hemorrhage of left eye, patient reports recent injections due to macular degeneration  Neck: Normal range of motion. Neck supple. No JVD present. No tracheal deviation present. No thyromegaly present.  Cardiovascular: Normal rate, regular rhythm, normal heart sounds and intact distal pulses.  Exam reveals no gallop and no friction rub.   No murmur heard. Pulmonary/Chest: Effort normal. No stridor. No respiratory distress. He has wheezes (wheezing and coarse lung sounds without rhonchi, no cough). He has no rales. He exhibits no tenderness.  Abdominal: Soft. Bowel sounds are normal. He exhibits no distension and no mass. There is no tenderness. There is no rebound and no guarding.  Musculoskeletal: Normal range of motion. He exhibits no edema and no tenderness.  Lymphadenopathy:    He has no cervical adenopathy.  Neurological: He is alert and oriented to person, place, and time. He displays normal reflexes. He exhibits normal muscle tone. Coordination normal.  Skin: Skin is warm and dry. No rash noted. No erythema. No pallor.  The patient has irritated skin with scabbing over bilateral ears, patient has been using topical treatment for skin cancer.  Psychiatric: He has a normal mood and affect. His behavior is normal. Judgment and thought content normal.    ED Course  Procedures (including critical care time) Labs Review Labs Reviewed  CBC WITH DIFFERENTIAL - Abnormal; Notable for the following:    WBC 16.6 (*)    Neutrophils Relative % 81 (*)    Neutro Abs 13.4 (*)    Monocytes Absolute 1.1 (*)    All other components within normal limits  BASIC METABOLIC PANEL - Abnormal; Notable for the following:    Sodium 133 (*)    Glucose, Bld 132 (*)    GFR calc non Af Amer 77 (*)    GFR calc Af Amer 89 (*)    All other components within normal limits   URINALYSIS, ROUTINE W REFLEX MICROSCOPIC  I-STAT CG4 LACTIC ACID, ED    Imaging Review Dg Chest 2 View  09/12/2014   CLINICAL DATA:  Cough, congestion and runny nose for 3 weeks. Fever. Subacute onset of symptoms. Initial encounter.  EXAM: CHEST  2 VIEW  COMPARISON:  Chest radiograph performed 04/06/2014  FINDINGS: There is mildly decreased lung expansion, though the lungs remain relatively well expanded. Worsening bibasilar airspace opacification may reflect worsening fibrotic change, though superimposed pneumonia is a concern. There is no evidence of pleural effusion or pneumothorax.  The heart is normal in size; the mediastinal contour is within normal limits. No acute osseous abnormalities are seen.  IMPRESSION: Worsening bibasilar airspace opacification may reflect worsening fibrotic change, though superimposed pneumonia is a concern given the patient's symptoms.   Electronically Signed   By: Garald Balding [name]   On: 09/12/2014 02:01     EKG Interpretation None      MDM   Final diagnoses:  Community acquired pneumonia  Pulmonary emphysema, unspecified emphysema type    80 year old male with low-grade fever, confusion and history of pneumonia and COPD, family is concerned for return of pneumonia.  He has not yet received a flu shot this year due to 2 recent allergy-type symptoms.  Plan for chest x-ray lab work, abg.  Patient refuses breathing treatment as he gets jittery with albuterol  Chest x-ray with increasing bibasilar airspace opacification, fibrotic change versus pneumonia.  White blood cell count elevated.  Patient and family refused ABG, prednisone.  Will start on levaquin, encouraged to increase oxygen use and close follow up with his pulmonologist    Kalman Drape, MD 09/12/14 [id number]

## 2014-09-12 NOTE — Progress Notes (Signed)
Patient refused ABG for patient. MD and RN aware.

## 2014-09-12 NOTE — Telephone Encounter (Signed)
Called and spoke with pts family and they are aware of CY recs.  Per CY--ok not to give the zpak since he is on the 10 day levaquin.  Daughter is aware.  She is aware that meds have been called to the pharmacy and nothing further is needed.

## 2014-09-12 NOTE — ED Notes (Signed)
Pt denies pain, states has been coughing a lot, yellowish sputum

## 2014-12-19 ENCOUNTER — Ambulatory Visit (INDEPENDENT_AMBULATORY_CARE_PROVIDER_SITE_OTHER)
Admission: RE | Admit: 2014-12-19 | Discharge: 2014-12-19 | Disposition: A | Discharge status: Home / Self Care | Payer: Medicare PPO | Source: Ambulatory Visit | Attending: Internal Medicine | Admitting: Internal Medicine

## 2014-12-19 ENCOUNTER — Encounter: Payer: Self-pay | Admitting: Internal Medicine

## 2014-12-19 ENCOUNTER — Ambulatory Visit (INDEPENDENT_AMBULATORY_CARE_PROVIDER_SITE_OTHER): Payer: Medicare PPO | Admitting: Internal Medicine

## 2014-12-19 ENCOUNTER — Encounter: Payer: Self-pay | Admitting: *Deleted

## 2014-12-19 ENCOUNTER — Other Ambulatory Visit (INDEPENDENT_AMBULATORY_CARE_PROVIDER_SITE_OTHER): Payer: Medicare PPO

## 2014-12-19 VITALS — BP 128/72 | HR 101 | Temp 97.7°F | Ht 66.0 in | Wt 135.4 lb

## 2014-12-19 DIAGNOSIS — J432 Centrilobular emphysema: Secondary | ICD-10-CM

## 2014-12-19 DIAGNOSIS — J849 Interstitial pulmonary disease, unspecified: Secondary | ICD-10-CM

## 2014-12-19 LAB — BASIC METABOLIC PANEL
BUN: 11 mg/dL (ref 6–23)
CHLORIDE: 98 meq/L (ref 96–112)
CO2: 27 mEq/L (ref 19–32)
Calcium: 9 mg/dL (ref 8.4–10.5)
Creatinine, Ser: 1.1 mg/dL (ref 0.4–1.5)
GFR: 69.7 mL/min (ref >60.00)
Glucose, Bld: 154 mg/dL — ABNORMAL HIGH (ref 70–99)
POTASSIUM: 4.4 meq/L (ref 3.5–5.1)
Sodium: 132 mEq/L — ABNORMAL LOW (ref 135–145)

## 2014-12-19 LAB — CBC WITH DIFFERENTIAL/PLATELET
Basophils Absolute: 0 10*3/uL (ref 0.0–0.1)
Basophils Relative: 0.2 % (ref 0.0–3.0)
EOS PCT: 0.5 % (ref 0.0–5.0)
Eosinophils Absolute: 0.1 10*3/uL (ref 0.0–0.7)
HEMATOCRIT: 46.8 % (ref 39.0–52.0)
HEMOGLOBIN: 15.8 g/dL (ref 13.0–17.0)
Lymphocytes Relative: 14.9 % (ref 12.0–46.0)
Lymphs Abs: 2 10*3/uL (ref 0.7–4.0)
MCHC: 33.7 g/dL (ref 30.0–36.0)
MCV: 89.3 fl (ref 78.0–100.0)
Monocytes Absolute: 0.7 10*3/uL (ref 0.1–1.0)
Monocytes Relative: 5.5 % (ref 3.0–12.0)
NEUTROS ABS: 10.6 10*3/uL — AB (ref 1.4–7.7)
Neutrophils Relative %: 78.9 % — ABNORMAL HIGH (ref 43.0–77.0)
Platelets: 240 10*3/uL (ref 150.0–400.0)
RBC: 5.24 Mil/uL (ref 4.22–5.81)
RDW: 14.7 % (ref 11.5–15.5)
WBC: 13.4 10*3/uL — ABNORMAL HIGH (ref 4.0–10.5)

## 2014-12-19 LAB — SEDIMENTATION RATE: Sed Rate: 79 mm/hr — ABNORMAL HIGH (ref 0–22)

## 2014-12-19 NOTE — Assessment & Plan Note (Signed)
At time chest x-ray was done in October 2015, radiologist could not exclude a component of pneumonia based on symptoms. I am impressed by the interstitial disease and apparent honeycombing and suspect this is a pulmonary fibrosis disorder, may be with superimposed infection. He has had a series of antibiotics by his description, with temporary or no effect. Plan-update chest x-ray done probably request CT scan, labs to check infection/inflammatory status-CBC with differential, AMA, sedimentation rate

## 2014-12-19 NOTE — Progress Notes (Signed)
Patient ID: [name], male    DOB: April 02, 1927, 81 y.o.   MRN: [id number] 10/11/10-HPI 81 yoM former smoker here with his wife for f/u of asthma and allergic rhinitis, complicated by COPD and GERD. Last here October 11, 2010- note reviewed. His memory is failing. No major problems since last here.  Has noted a little morning sneeze. Coughs a little at night. Throat clearing. Tries mucinex. Protonix has markedly improved his heartburn. He he has his HOB elevated and avoids steeply reclining his chair. He denies significant cough, wheeze or shortness of breath.  CT chest reviewed from January, 2012- tiny LLL nodule for f/u by Dr Mellody Drown in 1 year, COPD, CAD. Uses Advair once daily, sometimes twice. Qvar is listed, but not used.  CT 01/06/11- tiny LLL nodule, emphysema, CAD/ coronary calcification.   01/22/12- 81 yoM former smoker here with his wife for f/u of asthma and allergic rhinitis, complicated by COPD and GERD. Here with wife today. They both had flu shots. He has not had a significant respiratory infection over the last year. Not aware of shortness of breath. His wife pays more tension than he does. He only admits to cough if he eats chocolate. Occasional food hang up if he doesn't chew well but this is not painful or progressive. Denies obvious reflux or any impact on his breathing.  03/02/13- 81 yoM former smoker here with his wife for f/u of asthma and allergic rhinitis, complicated by COPD and GERD. FOLLOWS FOR: states breathing is doing good unless getting in rush.  Wife states patient stops breathing at night-doesnt happen often. Needs to have Advair changed to something cheaper-would like to look at using nebulizer instead He denies any dyspnea on exertion but his wife insists that he gives out of breath walking a short distance outside their front door and implies memory isn't very good. Had chest x-ray by his primary physician/Dr. Redmond Pulling one month ago after antibiotics for  bronchitis. He was told there was a "spot". A 3 mm left lower lobe nodule had first been mentioned on CT chest January 2012. CXR 01/26/12 IMPRESSION:  1. Grossly stable findings of advanced emphysematous change  without definite superimposed acute cardiopulmonary disease.  2. Previously identified 3 mm nodule within the left lower lobe is  not well evaluated on plain film radiography. Further evaluation  with a chest CT may be performed as clinically indicated.  Original Report Authenticated By: [name], [name]  03/23/2014 [facility] follow up  Patient returns for a post hospital followup. He was admitted April BS through April 8 for community-acquired pneumonia, and COPD exacerbation. Patient was treated with IV antibiotics, discharged on Levaquin. Along with a prednisone taper. Patient did have desaturations persist during his hospital stay. Was discharged on oxygen therapy.  Doing well on O2 . sats adequate on 2l/m with walking and at rest . sats on RA 88% at rest .  CXR showed increased mid to lower lung interstitial fibrosis.  W/ increased areas in lingula and left base.  Reports breathing is improved since discharge, still having some prod cough with cream/green mucus.  done with Levaquin, still taking pred taper. Denies any hemoptysis, orthopnea, PND, leg swelling, or fever   04/06/14- 81 yoM  former smoker here with his wife for f/u of asthma and allergic rhinitis, complicated by COPD and GERD    Wife, daughter and granddaughter here ACUTE VISIT: CXR done today-  Daily cough still productive of yellow sputum but  no fever or chest pain. He has improved significantly since hospitalized in April with pneumonia. He has been using Advair once daily and avoiding use of nebulizer which he blamed on making him "sick". Home oxygen 2 L since last hospital stay. Ambulating on room air today drop saturation to 86%. He came with tank oxygen which is too heavy for him to manage. They ask  about Prevnar. CXR 04/06/14 IMPRESSION:  Stable bilateral basilar interstitial densities are noted compared  to prior exam, most consistent with pulmonary fibrosis or chronic  interstitial lung disease. Superimposed edema or pneumonia cannot be  excluded. Probable emphysematous changes noted in both upper lobes.  Electronically Signed  By: [name]  On: 04/06/2014 11:42  08/17/14- 81 yoM  former smoker here with his wife for f/u of asthma and allergic rhinitis, complicated by COPD and GERD  FOLLOWS FOR:c/o sorethroat since yesterday,hoarseness,took Zyrtec,cough-white,sob same,pnd,no fcs              Wife, daughter here They bring record listing O2 room air sat about 97% every day. He puts O2 on for 1 hour daily. O2 2L [facility] recent runny nose adn mild sore throat with clear sputum.  12/19/14- 81 yoM  former smoker here with his wife for f/u of asthma and allergic rhinitis, complicated by COPD and GERD  son here FOLLOWS TIR:WERX on 3 rounds of abx in 2-3 mths., low grade fever at times,sob with exertion,no wheezing,cough-yellow,chest tightness occass. in ribs,fell in Dec. 2015-hurt hand.,had ONO in Nov. 2015. Arrived w O2 88%, improved w rest to 91% Primary physician in [place] has been treating fever/cough/shortness of breath which has been intermittent through the fall. Family stress complicating: He lives with wife who has Alzheimer's, one of his sisters has just died and others had a major stroke in brother is a nursing home. He sleeps all night every night with oxygen 2 L/[employer] and sometimes sits with it in the daytime but has no portable. He had had portable oxygen turned back in the past and doesn't think he needs it now. Admits dyspnea on exertion climbing stairs or hurrying. Cough "not bad" treated with Mucinex. He denies any sense of reflux, anything purulent or bloody, chest pain. CXR 09/13/15 reviewed note basilar fibrosis and  honeycombing IMPRESSION: Worsening bibasilar airspace opacification may reflect worsening fibrotic change, though superimposed pneumonia is a concern given the patient's symptoms. Electronically Signed  By: Garald Balding [name]  On: 09/12/2014 02:01  ROS-see HPI Constitutional:   No-   weight loss, night sweats, fevers, chills,  +fatigue, lassitude. HEENT:   No-  headaches, difficulty swallowing, tooth/dental problems, sore throat,       No-  sneezing, itching, ear ache, nasal congestion, +post nasal drip,  CV:  No-   chest pain, orthopnea, PND, swelling in lower extremities, anasarca, dizziness, palpitations Resp: + shortness of breath with exertion or at rest.              No- productive cough,  + non-productive cough,  No- coughing up of blood.              No-   change in color of mucus.  No- wheezing.   Skin: No-   rash or lesions. GI:  No-   heartburn, indigestion, abdominal pain, nausea, vomiting,  GU: MS:  No-   joint pain or swelling.   Neuro-     nothing unusual Psych:  No- change in mood or affect. No depression or anxiety.  ?  memory loss.  Objective:   OBJ- Physical Exam General- Alert, Oriented, Affect-appropriate, Distress- none acute Skin- rash-none, lesions- none, excoriation- none Lymphadenopathy- none Head- atraumatic            Eyes- Gross vision intact, PERRLA, conjunctivae and secretions clear            Ears- +HOH            Nose- Clear, no-Septal dev, mucus, polyps, erosion, perforation             Throat- Mallampati II , mucosa clear , drainage- none, tonsils- atrophic Neck- flexible , trachea midline, no stridor , thyroid nl, carotid no bruit Chest - symmetrical excursion , unlabored           Heart/CV- +RRR with extra beats , no murmur , no gallop  , no rub, nl s1 s2                           - JVD+1cm , edema- none, stasis changes- none, varices- none           Lung- + minimal crackles, unlabored on room air , wheeze- none, cough- none ,  dullness-none, rub- none           Chest wall-  Abd-  Br/ Gen/ Rectal- Not done, not indicated Extrem- cyanosis- none, clubbing- none, atrophy- none, strength- nl Neuro- grossly intact to observation

## 2014-12-19 NOTE — Patient Instructions (Addendum)
Order- CXR   Dx interstitial lung disease              Lab-- CBC w diff, ANA, sed rate, BMET       We can continue oxygen 2L while you sleep and as needed

## 2014-12-19 NOTE — Assessment & Plan Note (Signed)
He has home oxygen for sleep but no longer has portable oxygen. He was borderline on arrival with saturation 88% on room air after exertion, recovering to 91%. I feel that adding portable oxygen now, when he doesn't think he needs it, would be more complication due to his life that he would be able to handle appropriately. We will recheck when he returns. I explained this to him and his son.

## 2014-12-20 LAB — ANA: ANA: NEGATIVE

## 2014-12-21 ENCOUNTER — Telehealth: Payer: Self-pay | Admitting: Internal Medicine

## 2014-12-21 MED ORDER — AMOXICILLIN-POT CLAVULANATE 875-125 MG PO TABS: 1.0000 | ORAL_TABLET | Freq: Two times a day (BID) | ORAL | Status: DC

## 2014-12-21 NOTE — Telephone Encounter (Signed)
Spoke with Paula(pt daughter), results have been explained. Rx sent to pharmacy. Nothing further needed.  Notes Recorded by Deneise Lever, MD on 12/19/2014 at 4:43 PM CXR today looks like a pneumonia in the lower right lung, on top of fibrosis in the lower part of both lungs. Recommend- We will send prescription for augmentin 875, # 20, 1 twice daily x 10 days

## 2015-02-20 ENCOUNTER — Encounter: Payer: Self-pay | Admitting: Internal Medicine

## 2015-02-20 ENCOUNTER — Ambulatory Visit (INDEPENDENT_AMBULATORY_CARE_PROVIDER_SITE_OTHER): Payer: Medicare HMO | Admitting: Internal Medicine

## 2015-02-20 ENCOUNTER — Ambulatory Visit (INDEPENDENT_AMBULATORY_CARE_PROVIDER_SITE_OTHER)
Admission: RE | Admit: 2015-02-20 | Discharge: 2015-02-20 | Disposition: A | Discharge status: Home / Self Care | Payer: Medicare HMO | Source: Ambulatory Visit | Attending: Internal Medicine | Admitting: Internal Medicine

## 2015-02-20 VITALS — BP 138/70 | HR 65 | Ht 66.0 in | Wt 136.8 lb

## 2015-02-20 DIAGNOSIS — J189 Pneumonia, unspecified organism: Secondary | ICD-10-CM | POA: Diagnosis not present

## 2015-02-20 DIAGNOSIS — J432 Centrilobular emphysema: Secondary | ICD-10-CM

## 2015-02-20 DIAGNOSIS — J8410 Pulmonary fibrosis, unspecified: Secondary | ICD-10-CM

## 2015-02-20 DIAGNOSIS — J849 Interstitial pulmonary disease, unspecified: Secondary | ICD-10-CM | POA: Diagnosis not present

## 2015-02-20 NOTE — Patient Instructions (Signed)
Order- CXR  Dx pulmonary fibrosis, pneumonia   Try taking your Mucinex off and on a week at a time. See if you can tell any real difference for your lungs. If it doesn't help much, then you can just stop it.

## 2015-02-20 NOTE — Progress Notes (Signed)
Patient ID: [name], male    DOB: April 02, 1927, 81 y.o.   MRN: [id number] 10/11/10-HPI 89 yoM former smoker here with his wife for f/u of asthma and allergic rhinitis, complicated by COPD and GERD. Last here October 11, 2010- note reviewed. His memory is failing. No major problems since last here.  Has noted a little morning sneeze. Coughs a little at night. Throat clearing. Tries mucinex. Protonix has markedly improved his heartburn. He he has his HOB elevated and avoids steeply reclining his chair. He denies significant cough, wheeze or shortness of breath.  CT chest reviewed from January, 2012- tiny LLL nodule for f/u by Dr Mellody Drown in 1 year, COPD, CAD. Uses Advair once daily, sometimes twice. Qvar is listed, but not used.  CT 01/06/11- tiny LLL nodule, emphysema, CAD/ coronary calcification.   01/22/12- 73 yoM former smoker here with his wife for f/u of asthma and allergic rhinitis, complicated by COPD and GERD. Here with wife today. They both had flu shots. He has not had a significant respiratory infection over the last year. Not aware of shortness of breath. His wife pays more tension than he does. He only admits to cough if he eats chocolate. Occasional food hang up if he doesn't chew well but this is not painful or progressive. Denies obvious reflux or any impact on his breathing.  03/02/13- 63 yoM former smoker here with his wife for f/u of asthma and allergic rhinitis, complicated by COPD and GERD. FOLLOWS FOR: states breathing is doing good unless getting in rush.  Wife states patient stops breathing at night-doesnt happen often. Needs to have Advair changed to something cheaper-would like to look at using nebulizer instead He denies any dyspnea on exertion but his wife insists that he gives out of breath walking a short distance outside their front door and implies memory isn't very good. Had chest x-ray by his primary physician/Dr. Redmond Pulling one month ago after antibiotics for  bronchitis. He was told there was a "spot". A 3 mm left lower lobe nodule had first been mentioned on CT chest January 2012. CXR 01/26/12 IMPRESSION:  1. Grossly stable findings of advanced emphysematous change  without definite superimposed acute cardiopulmonary disease.  2. Previously identified 3 mm nodule within the left lower lobe is  not well evaluated on plain film radiography. Further evaluation  with a chest CT may be performed as clinically indicated.  Original Report Authenticated By: [name], [name]  03/23/2014 [facility] follow up  Patient returns for a post hospital followup. He was admitted April BS through April 8 for community-acquired pneumonia, and COPD exacerbation. Patient was treated with IV antibiotics, discharged on Levaquin. Along with a prednisone taper. Patient did have desaturations persist during his hospital stay. Was discharged on oxygen therapy.  Doing well on O2 . sats adequate on 2l/m with walking and at rest . sats on RA 88% at rest .  CXR showed increased mid to lower lung interstitial fibrosis.  W/ increased areas in lingula and left base.  Reports breathing is improved since discharge, still having some prod cough with cream/green mucus.  done with Levaquin, still taking pred taper. Denies any hemoptysis, orthopnea, PND, leg swelling, or fever   04/06/14- 86 yoM  former smoker here with his wife for f/u of asthma and allergic rhinitis, complicated by COPD and GERD    Wife, daughter and granddaughter here ACUTE VISIT: CXR done today-  Daily cough still productive of yellow sputum but  no fever or chest pain. He has improved significantly since hospitalized in April with pneumonia. He has been using Advair once daily and avoiding use of nebulizer which he blamed on making him "sick". Home oxygen 2 L since last hospital stay. Ambulating on room air today drop saturation to 86%. He came with tank oxygen which is too heavy for him to manage. They ask  about Prevnar. CXR 04/06/14 IMPRESSION:  Stable bilateral basilar interstitial densities are noted compared  to prior exam, most consistent with pulmonary fibrosis or chronic  interstitial lung disease. Superimposed edema or pneumonia cannot be  excluded. Probable emphysematous changes noted in both upper lobes.  Electronically Signed  By: [name]  On: 04/06/2014 11:42  08/17/14- 41 yoM  former smoker here with his wife for f/u of asthma and allergic rhinitis, complicated by COPD and GERD  FOLLOWS FOR:c/o sorethroat since yesterday,hoarseness,took Zyrtec,cough-white,sob same,pnd,no fcs              Wife, daughter here They bring record listing O2 room air sat about 97% every day. He puts O2 on for 1 hour daily. O2 2L [facility] recent runny nose adn mild sore throat with clear sputum.  12/19/14- 9 yoM  former smoker here with his wife for f/u of asthma and allergic rhinitis, complicated by COPD and GERD  son here FOLLOWS NAT:FTDD on 3 rounds of abx in 2-3 mths., low grade fever at times,sob with exertion,no wheezing,cough-yellow,chest tightness occass. in ribs,fell in Dec. 2015-hurt hand.,had ONO in Nov. 2015. Arrived w O2 88%, improved w rest to 91% Primary physician in [place] has been treating fever/cough/shortness of breath which has been intermittent through the fall. Family stress complicating: He lives with wife who has Alzheimer's, one of his sisters has just died and others had a major stroke in brother is a nursing home. He sleeps all night every night with oxygen 2 L/[employer] and sometimes sits with it in the daytime but has no portable. He had had portable oxygen turned back in the past and doesn't think he needs it now. Admits dyspnea on exertion climbing stairs or hurrying. Cough "not bad" treated with Mucinex. He denies any sense of reflux, anything purulent or bloody, chest pain. CXR 09/13/15 reviewed note basilar fibrosis and  honeycombing IMPRESSION: Worsening bibasilar airspace opacification may reflect worsening fibrotic change, though superimposed pneumonia is a concern given the patient's symptoms. Electronically Signed  By: Garald Balding [name]  On: 09/12/2014 02:01  02/20/15-87 yoM  former smoker here with his wife for f/u of asthma/COPD, fibrosis and allergic rhinitis, complicated by GERD  daughter here Sleeps all night every night with oxygen 2 L/[employer] Follows For: SOB with exertion, prod cough in am's (cream) - Denies wheezing or chest tightness. We sent augmentin 1/14 for pneumonia. 12/19/14- LABS-WBC 13,400, ESR 79, ANA neg, GLU 154, Na 132 He says he is doing well now. He continues oxygen 2 L for sleep but doesn't like it. CXR 12/19/14- IMPRESSION: 1. Findings are consistent with right lower lung zone pneumonia superimposed on changes of interstitial fibrosis. There is also underlying COPD/emphysema. Electronically Signed  By: Lajean Manes [name]  On: 12/19/2014 15:34  ROS-see HPI Constitutional:   No-   weight loss, night sweats, fevers, chills,  +fatigue, lassitude. HEENT:   No-  headaches, difficulty swallowing, tooth/dental problems, sore throat,       No-  sneezing, itching, ear ache, nasal congestion, +post nasal drip,  CV:  No-   chest pain, orthopnea, PND,  swelling in lower extremities, anasarca, dizziness, palpitations Resp: + shortness of breath with exertion or at rest.              No- productive cough,  + non-productive cough,  No- coughing up of blood.              No-   change in color of mucus.  No- wheezing.   Skin: No-   rash or lesions. GI:  No-   heartburn, indigestion, abdominal pain, nausea, vomiting,  GU: MS:  No-   joint pain or swelling.   Neuro-     nothing unusual Psych:  No- change in mood or affect. No depression or anxiety.  ? memory loss.  Objective:   OBJ- Physical Exam General- Alert, Oriented, Affect-appropriate, Distress- none acute Skin- rash-none,  lesions- none, excoriation- none Lymphadenopathy- none Head- atraumatic            Eyes- Gross vision intact, PERRLA, conjunctivae and secretions clear            Ears- +HOH            Nose- Clear, no-Septal dev, mucus, polyps, erosion, perforation             Throat- Mallampati II , mucosa clear , drainage- none, tonsils- atrophic Neck- flexible , trachea midline, no stridor , thyroid nl, carotid no bruit Chest - symmetrical excursion , unlabored           Heart/CV- +RRR with extra beats , no murmur , no gallop  , no rub, nl s1 s2                           - JVD -none, edema- none, stasis changes- none, varices- none           Lung- + raspy, unlabored on room air , wheeze- none, cough + light , dullness-none, rub- none           Chest wall-  Abd-  Br/ Gen/ Rectal- Not done, not indicated Extrem- cyanosis- none, clubbing- none, atrophy- none, strength- nl Neuro- grossly intact to observation             

## 2015-02-21 NOTE — Progress Notes (Signed)
Quick Note:  Called and spoke to pt. Informed pt of the results and recs per CY. Pt verbalized understanding and denied any further questions or concerns at this time. ______ 

## 2015-02-22 ENCOUNTER — Telehealth: Payer: Self-pay | Admitting: Internal Medicine

## 2015-02-22 NOTE — Telephone Encounter (Signed)
LMTC x 1  

## 2015-02-23 NOTE — Telephone Encounter (Signed)
ATC number listed x 2. Line disconnected after static. On pt's DPR- Traci Plemons, pt's daughter, is the only designated party to release pt's medical information to.

## 2015-02-26 NOTE — Telephone Encounter (Signed)
LMTCB x2  

## 2015-02-27 NOTE — Telephone Encounter (Signed)
Pt returning call.[name] ° °

## 2015-02-27 NOTE — Telephone Encounter (Signed)
lmtcb x3 

## 2015-02-27 NOTE — Telephone Encounter (Signed)
Notes Recorded by Deneise Lever, MD on 02/20/2015 at 3:36 PM CXR- no pneumonia or active process. The fibrosis is stable at this time, but we will continue to watch. -- [name] is listed as an emergency contact. She is aware of pt's results. Nothing further was needed.

## 2015-03-04 ENCOUNTER — Encounter: Payer: Self-pay | Admitting: Internal Medicine

## 2015-03-04 NOTE — Assessment & Plan Note (Signed)
Clinically resolving. Suspect he may have had an aspiration pneumonia but he does not recognize aspiration events. Plan-chest x-ray. Reflux precautions.

## 2015-03-04 NOTE — Assessment & Plan Note (Signed)
We will continue to monitor chest x-ray for progression.

## 2015-03-04 NOTE — Assessment & Plan Note (Signed)
Now returning to baseline after recent pneumonia. Plan-chest x-ray

## 2015-06-26 ENCOUNTER — Ambulatory Visit (INDEPENDENT_AMBULATORY_CARE_PROVIDER_SITE_OTHER): Payer: Medicare HMO | Admitting: Internal Medicine

## 2015-06-26 ENCOUNTER — Encounter: Payer: Self-pay | Admitting: Internal Medicine

## 2015-06-26 VITALS — BP 130/70 | HR 71 | Ht 66.0 in | Wt 144.0 lb

## 2015-06-26 DIAGNOSIS — J849 Interstitial pulmonary disease, unspecified: Secondary | ICD-10-CM

## 2015-06-26 DIAGNOSIS — J432 Centrilobular emphysema: Secondary | ICD-10-CM | POA: Diagnosis not present

## 2015-06-26 NOTE — Assessment & Plan Note (Signed)
Centrilobular emphysema without significant bronchitis component currently.

## 2015-06-26 NOTE — Assessment & Plan Note (Signed)
Pattern consistent with Usual Interstitial Pneumonia. I discussed prognosis and the 2 available drugs used to slow down progression of that disease. He doesn't want to do a CT scan that won't change therapy and does not want either drug, saying he is 81 years old. In my opinion this is not unreasonable and he seems able to make appropriate decisions on his own behalf. His daughter is comfortable with his decision and indicates that she has a background with Hospice and can discuss DNR decisions when the time comes. Her brother is POA

## 2015-06-26 NOTE — Progress Notes (Signed)
Patient ID: [name], male    DOB: April 02, 1927, 81 y.o.   MRN: [id number] 10/11/10-HPI 81 yoM former smoker here with his wife for f/u of asthma and allergic rhinitis, complicated by COPD and GERD. Last here October 11, 2010- note reviewed. His memory is failing. No major problems since last here.  Has noted a little morning sneeze. Coughs a little at night. Throat clearing. Tries mucinex. Protonix has markedly improved his heartburn. He he has his HOB elevated and avoids steeply reclining his chair. He denies significant cough, wheeze or shortness of breath.  CT chest reviewed from January, 2012- tiny LLL nodule for f/u by Dr Mellody Drown in 1 year, COPD, CAD. Uses Advair once daily, sometimes twice. Qvar is listed, but not used.  CT 01/06/11- tiny LLL nodule, emphysema, CAD/ coronary calcification.   01/22/12- 81 yoM former smoker here with his wife for f/u of asthma and allergic rhinitis, complicated by COPD and GERD. Here with wife today. They both had flu shots. He has not had a significant respiratory infection over the last year. Not aware of shortness of breath. His wife pays more tension than he does. He only admits to cough if he eats chocolate. Occasional food hang up if he doesn't chew well but this is not painful or progressive. Denies obvious reflux or any impact on his breathing.  03/02/13- 81 yoM former smoker here with his wife for f/u of asthma and allergic rhinitis, complicated by COPD and GERD. FOLLOWS FOR: states breathing is doing good unless getting in rush.  Wife states patient stops breathing at night-doesnt happen often. Needs to have Advair changed to something cheaper-would like to look at using nebulizer instead He denies any dyspnea on exertion but his wife insists that he gives out of breath walking a short distance outside their front door and implies memory isn't very good. Had chest x-ray by his primary physician/Dr. Redmond Pulling one month ago after antibiotics for  bronchitis. He was told there was a "spot". A 3 mm left lower lobe nodule had first been mentioned on CT chest January 2012. CXR 01/26/12 IMPRESSION:  1. Grossly stable findings of advanced emphysematous change  without definite superimposed acute cardiopulmonary disease.  2. Previously identified 3 mm nodule within the left lower lobe is  not well evaluated on plain film radiography. Further evaluation  with a chest CT may be performed as clinically indicated.  Original Report Authenticated By: [name], [name]  03/23/2014 [facility] follow up  Patient returns for a post hospital followup. He was admitted April BS through April 8 for community-acquired pneumonia, and COPD exacerbation. Patient was treated with IV antibiotics, discharged on Levaquin. Along with a prednisone taper. Patient did have desaturations persist during his hospital stay. Was discharged on oxygen therapy.  Doing well on O2 . sats adequate on 2l/m with walking and at rest . sats on RA 88% at rest .  CXR showed increased mid to lower lung interstitial fibrosis.  W/ increased areas in lingula and left base.  Reports breathing is improved since discharge, still having some prod cough with cream/green mucus.  done with Levaquin, still taking pred taper. Denies any hemoptysis, orthopnea, PND, leg swelling, or fever   04/06/14- 81 yoM  former smoker here with his wife for f/u of asthma and allergic rhinitis, complicated by COPD and GERD    Wife, daughter and granddaughter here ACUTE VISIT: CXR done today-  Daily cough still productive of yellow sputum but  no fever or chest pain. He has improved significantly since hospitalized in April with pneumonia. He has been using Advair once daily and avoiding use of nebulizer which he blamed on making him "sick". Home oxygen 2 L since last hospital stay. Ambulating on room air today drop saturation to 86%. He came with tank oxygen which is too heavy for him to manage. They ask  about Prevnar. CXR 04/06/14 IMPRESSION:  Stable bilateral basilar interstitial densities are noted compared  to prior exam, most consistent with pulmonary fibrosis or chronic  interstitial lung disease. Superimposed edema or pneumonia cannot be  excluded. Probable emphysematous changes noted in both upper lobes.  Electronically Signed  By: [name]  On: 04/06/2014 11:42  08/17/14- 81 yoM  former smoker here with his wife for f/u of asthma and allergic rhinitis, complicated by COPD and GERD  FOLLOWS FOR:c/o sorethroat since yesterday,hoarseness,took Zyrtec,cough-white,sob same,pnd,no fcs              Wife, daughter here They bring record listing O2 room air sat about 97% every day. He puts O2 on for 1 hour daily. O2 2L [facility] recent runny nose adn mild sore throat with clear sputum.  12/19/14- 81 yoM  former smoker here with his wife for f/u of asthma and allergic rhinitis, complicated by COPD and GERD  son here FOLLOWS DQQ:IWLN on 3 rounds of abx in 2-3 mths., low grade fever at times,sob with exertion,no wheezing,cough-yellow,chest tightness occass. in ribs,fell in Dec. 2015-hurt hand.,had ONO in Nov. 2015. Arrived w O2 88%, improved w rest to 91% Primary physician in [place] has been treating fever/cough/shortness of breath which has been intermittent through the fall. Family stress complicating: He lives with wife who has Alzheimer's, one of his sisters has just died and others had a major stroke in brother is a nursing home. He sleeps all night every night with oxygen 2 L/[employer] and sometimes sits with it in the daytime but has no portable. He had had portable oxygen turned back in the past and doesn't think he needs it now. Admits dyspnea on exertion climbing stairs or hurrying. Cough "not bad" treated with Mucinex. He denies any sense of reflux, anything purulent or bloody, chest pain. CXR 09/13/15 reviewed note basilar fibrosis and  honeycombing IMPRESSION: Worsening bibasilar airspace opacification may reflect worsening fibrotic change, though superimposed pneumonia is a concern given the patient's symptoms. Electronically Signed  By: Garald Balding [name]  On: 09/12/2014 02:01  02/20/15-87 yoM  former smoker here with his wife for f/u of asthma/COPD, fibrosis and allergic rhinitis, complicated by GERD  daughter here Sleeps all night every night with oxygen 2 L/[employer] Follows For: SOB with exertion, prod cough in am's (cream) - Denies wheezing or chest tightness. We sent augmentin 1/14 for pneumonia. 12/19/14- LABS-WBC 13,400, ESR 79, ANA neg, GLU 154, Na 132 He says he is doing well now. He continues oxygen 2 L for sleep but doesn't like it. CXR 12/19/14- IMPRESSION: 1. Findings are consistent with right lower lung zone pneumonia superimposed on changes of interstitial fibrosis. There is also underlying COPD/emphysema. Electronically Signed  By: Lajean Manes [name]  On: 12/19/2014 15:34  06/26/15- 62 yoM  former smoker here with his wife for f/u of asthma/COPD, UIP and allergic rhinitis, complicated by GERD  daughter here FOLLOWS FOR: Pt states his breathing has been doing good since last visit.  Aware of dyspnea on exertion climbing steps but not much cough or wheeze and he doesn't exert much.  Doesn't get out much. He remains clear in his own mind that he does not want to start either of the available therapy medications for treating UIP. Because of that we have not scheduled a follow-up CT scan. Asks to renew handicap parking. Daughter used to work for [employer] and is able to engage family discussion of DNR. CXR 02/21/15 IMPRESSION: Stable bibasilar interstitial densities are noted most consistent with pulmonary fibrosis. No significant changes noted compared to prior exam. There is no definite evidence of acute pulmonary disease. Electronically Signed  By: Marijo Conception, [name]  On: 02/20/2015  13:03  ROS-see HPI Constitutional:   No-   weight loss, night sweats, fevers, chills,  +fatigue, lassitude. HEENT:   No-  headaches, difficulty swallowing, tooth/dental problems, sore throat,       No-  sneezing, itching, ear ache, nasal congestion, +post nasal drip,  CV:  No-   chest pain, orthopnea, PND, swelling in lower extremities, anasarca, dizziness, palpitations Resp: + shortness of breath with exertion or at rest.              No- productive cough,   non-productive cough,  No- coughing up of blood.              No-   change in color of mucus.  No- wheezing.   Skin: No-   rash or lesions. GI:  No-   heartburn, indigestion, abdominal pain, nausea, vomiting,  GU: MS:  No-   joint pain or swelling.   Neuro-     nothing unusual Psych:  No- change in mood or affect. No depression or anxiety.  ? memory loss.  Objective:   OBJ- Physical Exam General- Alert, Oriented, Affect-appropriate, Distress- none acute Skin- + multiple areas recently burned for lesion removal by dermatologist Lymphadenopathy- none Head- atraumatic            Eyes- Gross vision intact, PERRLA, conjunctivae and secretions clear            Ears- +HOH-bilateral hearing aids            Nose- Clear, no-Septal dev, mucus, polyps, erosion, perforation             Throat- Mallampati II , mucosa clear , drainage- none, tonsils- atrophic Neck- flexible , trachea midline, no stridor , thyroid nl, carotid no bruit Chest - symmetrical excursion , unlabored           Heart/CV- +RRR with extra beats , no murmur , no gallop  , no rub, nl s1 s2                           - JVD -none, edema- none, stasis changes- none, varices- none           Lung- + faint crackles a little coarser than usually heard with UIP, unlabored on room air , wheeze- none, cough-none , dullness-none, rub- none           Chest wall-  Abd-  Br/ Gen/ Rectal- Not done, not indicated Extrem- cyanosis- none, clubbing- none, atrophy- none, strength-  nl Neuro- grossly intact to observation

## 2015-06-26 NOTE — Patient Instructions (Addendum)
We are comfortable that you do not wish to treat the pulmonary fibrosis regardless. We will follow along with you.  We will keep on with the oxygen 2 L at night   Please call as needed  Handicap parking form refilled

## 2015-08-30 ENCOUNTER — Telehealth: Payer: Self-pay | Admitting: Internal Medicine

## 2015-08-30 DIAGNOSIS — J449 Chronic obstructive pulmonary disease, unspecified: Secondary | ICD-10-CM

## 2015-08-30 NOTE — Telephone Encounter (Signed)
Ok to Rx replacement Chiropodist  Use as directed, for dx COPD mixed type

## 2015-08-30 NOTE — Telephone Encounter (Signed)
Called spoke with Gilbert (pt daughter). She reports pt incentive spirometer has broken and he uses it w/ his breathing exercises. Wants to get a new one. Please advise Dr. Annamaria Boots thanks

## 2015-08-30 NOTE — Telephone Encounter (Signed)
Spoke with pt's daughter, Granja.  Incentive Spirometer placed at front office for pick up along with form to sign from [facility].  Form verbally discussed with Nevin Bloodgood.  She is aware form will need to be signed at time of pick up.  [place] in agreement, was very appreciative, and voiced no further questions or concerns at this time.

## 2015-10-30 ENCOUNTER — Ambulatory Visit: Payer: Medicare HMO | Admitting: Internal Medicine

## 2015-11-29 ENCOUNTER — Encounter: Payer: Self-pay | Admitting: Internal Medicine

## 2015-11-29 ENCOUNTER — Ambulatory Visit (INDEPENDENT_AMBULATORY_CARE_PROVIDER_SITE_OTHER): Payer: Medicare HMO | Admitting: Internal Medicine

## 2015-11-29 VITALS — BP 130/62 | HR 92 | Ht 66.0 in | Wt 149.0 lb

## 2015-11-29 DIAGNOSIS — J432 Centrilobular emphysema: Secondary | ICD-10-CM

## 2015-11-29 NOTE — Patient Instructions (Signed)
Okay to continue oxygen 2 L for sleep    Ok to call for med refill when needed

## 2015-11-29 NOTE — Progress Notes (Signed)
Patient ID: [name], male    DOB: April 02, 1927, 81 y.o.   MRN: [id number] 10/11/10-HPI 89 yoM former smoker here with his wife for f/u of asthma and allergic rhinitis, complicated by COPD and GERD. Last here October 11, 2010- note reviewed. His memory is failing. No major problems since last here.  Has noted a little morning sneeze. Coughs a little at night. Throat clearing. Tries mucinex. Protonix has markedly improved his heartburn. He he has his HOB elevated and avoids steeply reclining his chair. He denies significant cough, wheeze or shortness of breath.  CT chest reviewed from January, 2012- tiny LLL nodule for f/u by Dr Mellody Drown in 1 year, COPD, CAD. Uses Advair once daily, sometimes twice. Qvar is listed, but not used.  CT 01/06/11- tiny LLL nodule, emphysema, CAD/ coronary calcification.   01/22/12- 73 yoM former smoker here with his wife for f/u of asthma and allergic rhinitis, complicated by COPD and GERD. Here with wife today. They both had flu shots. He has not had a significant respiratory infection over the last year. Not aware of shortness of breath. His wife pays more tension than he does. He only admits to cough if he eats chocolate. Occasional food hang up if he doesn't chew well but this is not painful or progressive. Denies obvious reflux or any impact on his breathing.  03/02/13- 63 yoM former smoker here with his wife for f/u of asthma and allergic rhinitis, complicated by COPD and GERD. FOLLOWS FOR: states breathing is doing good unless getting in rush.  Wife states patient stops breathing at night-doesnt happen often. Needs to have Advair changed to something cheaper-would like to look at using nebulizer instead He denies any dyspnea on exertion but his wife insists that he gives out of breath walking a short distance outside their front door and implies memory isn't very good. Had chest x-ray by his primary physician/Dr. Redmond Pulling one month ago after antibiotics for  bronchitis. He was told there was a "spot". A 3 mm left lower lobe nodule had first been mentioned on CT chest January 2012. CXR 01/26/12 IMPRESSION:  1. Grossly stable findings of advanced emphysematous change  without definite superimposed acute cardiopulmonary disease.  2. Previously identified 3 mm nodule within the left lower lobe is  not well evaluated on plain film radiography. Further evaluation  with a chest CT may be performed as clinically indicated.  Original Report Authenticated By: [name], [name]  03/23/2014 [facility] follow up  Patient returns for a post hospital followup. He was admitted April BS through April 8 for community-acquired pneumonia, and COPD exacerbation. Patient was treated with IV antibiotics, discharged on Levaquin. Along with a prednisone taper. Patient did have desaturations persist during his hospital stay. Was discharged on oxygen therapy.  Doing well on O2 . sats adequate on 2l/m with walking and at rest . sats on RA 88% at rest .  CXR showed increased mid to lower lung interstitial fibrosis.  W/ increased areas in lingula and left base.  Reports breathing is improved since discharge, still having some prod cough with cream/green mucus.  done with Levaquin, still taking pred taper. Denies any hemoptysis, orthopnea, PND, leg swelling, or fever   04/06/14- 86 yoM  former smoker here with his wife for f/u of asthma and allergic rhinitis, complicated by COPD and GERD    Wife, daughter and granddaughter here ACUTE VISIT: CXR done today-  Daily cough still productive of yellow sputum but  no fever or chest pain. He has improved significantly since hospitalized in April with pneumonia. He has been using Advair once daily and avoiding use of nebulizer which he blamed on making him "sick". Home oxygen 2 L since last hospital stay. Ambulating on room air today drop saturation to 86%. He came with tank oxygen which is too heavy for him to manage. They ask  about Prevnar. CXR 04/06/14 IMPRESSION:  Stable bilateral basilar interstitial densities are noted compared  to prior exam, most consistent with pulmonary fibrosis or chronic  interstitial lung disease. Superimposed edema or pneumonia cannot be  excluded. Probable emphysematous changes noted in both upper lobes.  Electronically Signed  By: [name]  On: 04/06/2014 11:42  08/17/14- 20 yoM  former smoker here with his wife for f/u of asthma and allergic rhinitis, complicated by COPD and GERD  FOLLOWS FOR:c/o sorethroat since yesterday,hoarseness,took Zyrtec,cough-white,sob same,pnd,no fcs              Wife, daughter here They bring record listing O2 room air sat about 97% every day. He puts O2 on for 1 hour daily. O2 2L [facility] recent runny nose adn mild sore throat with clear sputum.  12/19/14- 43 yoM  former smoker here with his wife for f/u of asthma and allergic rhinitis, complicated by COPD and GERD  son here FOLLOWS DQQ:IWLN on 3 rounds of abx in 2-3 mths., low grade fever at times,sob with exertion,no wheezing,cough-yellow,chest tightness occass. in ribs,fell in Dec. 2015-hurt hand.,had ONO in Nov. 2015. Arrived w O2 88%, improved w rest to 91% Primary physician in [place] has been treating fever/cough/shortness of breath which has been intermittent through the fall. Family stress complicating: He lives with wife who has Alzheimer's, one of his sisters has just died and others had a major stroke in brother is a nursing home. He sleeps all night every night with oxygen 2 L/[employer] and sometimes sits with it in the daytime but has no portable. He had had portable oxygen turned back in the past and doesn't think he needs it now. Admits dyspnea on exertion climbing stairs or hurrying. Cough "not bad" treated with Mucinex. He denies any sense of reflux, anything purulent or bloody, chest pain. CXR 09/13/15 reviewed note basilar fibrosis and  honeycombing IMPRESSION: Worsening bibasilar airspace opacification may reflect worsening fibrotic change, though superimposed pneumonia is a concern given the patient's symptoms. Electronically Signed  By: Garald Balding [name]  On: 09/12/2014 02:01  02/20/15-87 yoM  former smoker here with his wife for f/u of asthma/COPD, fibrosis and allergic rhinitis, complicated by GERD  daughter here Sleeps all night every night with oxygen 2 L/[employer] Follows For: SOB with exertion, prod cough in am's (cream) - Denies wheezing or chest tightness. We sent augmentin 1/14 for pneumonia. 12/19/14- LABS-WBC 13,400, ESR 79, ANA neg, GLU 154, Na 132 He says he is doing well now. He continues oxygen 2 L for sleep but doesn't like it. CXR 12/19/14- IMPRESSION: 1. Findings are consistent with right lower lung zone pneumonia superimposed on changes of interstitial fibrosis. There is also underlying COPD/emphysema. Electronically Signed  By: Lajean Manes [name]  On: 12/19/2014 15:34  06/26/15- 62 yoM  former smoker here with his wife for f/u of asthma/COPD, UIP and allergic rhinitis, complicated by GERD  daughter here FOLLOWS FOR: Pt states his breathing has been doing good since last visit.  Aware of dyspnea on exertion climbing steps but not much cough or wheeze and he doesn't exert much.  Doesn't get out much. He remains clear in his own mind that he does not want to start either of the available therapy medications for treating UIP. Because of that we have not scheduled a follow-up CT scan. Asks to renew handicap parking. Daughter used to work for [employer] and is able to engage family discussion of DNR. CXR 02/21/15 IMPRESSION: Stable bibasilar interstitial densities are noted most consistent with pulmonary fibrosis. No significant changes noted compared to prior exam. There is no definite evidence of acute pulmonary disease. Electronically Signed  By: Marijo Conception, [name]  On: 02/20/2015  13:03  11/29/2015-81 year old male former smoker followed for asthma/COPD, UIP, allergic rhinitis, complicated by GERD FOLLOWS FOR:Pt states that breathing has been doing good since last OV. Using 2 liters O2 at night while sleeping.  O2 2 L sleep [facility]     ROS-see HPI Constitutional:   No-   weight loss, night sweats, fevers, chills,  +fatigue, lassitude. HEENT:   No-  headaches, difficulty swallowing, tooth/dental problems, sore throat,       No-  sneezing, itching, ear ache, nasal congestion, +post nasal drip,  CV:  No-   chest pain, orthopnea, PND, swelling in lower extremities, anasarca, dizziness, palpitations Resp: + shortness of breath with exertion or at rest.              No- productive cough,   non-productive cough,  No- coughing up of blood.              No-   change in color of mucus.  No- wheezing.   Skin: No-   rash or lesions. GI:  No-   heartburn, indigestion, abdominal pain, nausea, vomiting,  GU: MS:  No-   joint pain or swelling.   Neuro-     nothing unusual Psych:  No- change in mood or affect. No depression or anxiety.  ? memory loss.  Objective:   OBJ- Physical Exam General- Alert, Oriented, Affect-appropriate, Distress- none acute Skin- + multiple areas recently burned for lesion removal by dermatologist Lymphadenopathy- none Head- atraumatic            Eyes- Gross vision intact, PERRLA, conjunctivae and secretions clear            Ears- +HOH-bilateral hearing aids            Nose- Clear, no-Septal dev, mucus, polyps, erosion, perforation             Throat- Mallampati II , mucosa clear , drainage- none, tonsils- atrophic Neck- flexible , trachea midline, no stridor , thyroid nl, carotid no bruit Chest - symmetrical excursion , unlabored           Heart/CV- +RRR with extra beats , no murmur , no gallop  , no rub, nl s1 s2                           - JVD -none, edema- none, stasis changes- none, varices- none           Lung- + faint crackles a  little coarser than usually heard with UIP, unlabored on room air , wheeze- none, cough-none , dullness-none, rub- none           Chest wall-  Abd-  Br/ Gen/ Rectal- Not done, not indicated Extrem- cyanosis- none, clubbing- none, atrophy- none, strength- nl Neuro- grossly intact to observation

## 2016-05-29 ENCOUNTER — Ambulatory Visit (INDEPENDENT_AMBULATORY_CARE_PROVIDER_SITE_OTHER): Payer: Medicare HMO | Admitting: Internal Medicine

## 2016-05-29 ENCOUNTER — Ambulatory Visit (INDEPENDENT_AMBULATORY_CARE_PROVIDER_SITE_OTHER)
Admission: RE | Admit: 2016-05-29 | Discharge: 2016-05-29 | Disposition: A | Discharge status: Home / Self Care | Payer: Medicare HMO | Source: Ambulatory Visit | Attending: Internal Medicine | Admitting: Internal Medicine

## 2016-05-29 ENCOUNTER — Encounter: Payer: Self-pay | Admitting: Internal Medicine

## 2016-05-29 VITALS — BP 122/68 | HR 89 | Ht 66.0 in | Wt 149.6 lb

## 2016-05-29 DIAGNOSIS — J849 Interstitial pulmonary disease, unspecified: Secondary | ICD-10-CM

## 2016-05-29 DIAGNOSIS — K219 Gastro-esophageal reflux disease without esophagitis: Secondary | ICD-10-CM

## 2016-05-29 DIAGNOSIS — J9611 Chronic respiratory failure with hypoxia: Secondary | ICD-10-CM | POA: Diagnosis not present

## 2016-05-29 DIAGNOSIS — J4520 Mild intermittent asthma, uncomplicated: Secondary | ICD-10-CM

## 2016-05-29 NOTE — Progress Notes (Signed)
Patient ID: [name], male    DOB: April 02, 1927, 82 y.o.   MRN: [id number] 10/11/10-HPI 89 yoM former smoker here with his wife for f/u of asthma and allergic rhinitis, complicated by COPD and GERD. Last here October 11, 2010- note reviewed. His memory is failing. No major problems since last here.  Has noted a little morning sneeze. Coughs a little at night. Throat clearing. Tries mucinex. Protonix has markedly improved his heartburn. He he has his HOB elevated and avoids steeply reclining his chair. He denies significant cough, wheeze or shortness of breath.  CT chest reviewed from January, 2012- tiny LLL nodule for f/u by Dr Mellody Drown in 1 year, COPD, CAD. Uses Advair once daily, sometimes twice. Qvar is listed, but not used.  CT 01/06/11- tiny LLL nodule, emphysema, CAD/ coronary calcification.   01/22/12- 73 yoM former smoker here with his wife for f/u of asthma and allergic rhinitis, complicated by COPD and GERD. Here with wife today. They both had flu shots. He has not had a significant respiratory infection over the last year. Not aware of shortness of breath. His wife pays more tension than he does. He only admits to cough if he eats chocolate. Occasional food hang up if he doesn't chew well but this is not painful or progressive. Denies obvious reflux or any impact on his breathing.  03/02/13- 63 yoM former smoker here with his wife for f/u of asthma and allergic rhinitis, complicated by COPD and GERD. FOLLOWS FOR: states breathing is doing good unless getting in rush.  Wife states patient stops breathing at night-doesnt happen often. Needs to have Advair changed to something cheaper-would like to look at using nebulizer instead He denies any dyspnea on exertion but his wife insists that he gives out of breath walking a short distance outside their front door and implies memory isn't very good. Had chest x-ray by his primary physician/Dr. Redmond Pulling one month ago after antibiotics for  bronchitis. He was told there was a "spot". A 3 mm left lower lobe nodule had first been mentioned on CT chest January 2012. CXR 01/26/12 IMPRESSION:  1. Grossly stable findings of advanced emphysematous change  without definite superimposed acute cardiopulmonary disease.  2. Previously identified 3 mm nodule within the left lower lobe is  not well evaluated on plain film radiography. Further evaluation  with a chest CT may be performed as clinically indicated.  Original Report Authenticated By: [name], [name]  03/23/2014 [facility] follow up  Patient returns for a post hospital followup. He was admitted April BS through April 8 for community-acquired pneumonia, and COPD exacerbation. Patient was treated with IV antibiotics, discharged on Levaquin. Along with a prednisone taper. Patient did have desaturations persist during his hospital stay. Was discharged on oxygen therapy.  Doing well on O2 . sats adequate on 2l/m with walking and at rest . sats on RA 88% at rest .  CXR showed increased mid to lower lung interstitial fibrosis.  W/ increased areas in lingula and left base.  Reports breathing is improved since discharge, still having some prod cough with cream/green mucus.  done with Levaquin, still taking pred taper. Denies any hemoptysis, orthopnea, PND, leg swelling, or fever   04/06/14- 86 yoM  former smoker here with his wife for f/u of asthma and allergic rhinitis, complicated by COPD and GERD    Wife, daughter and granddaughter here ACUTE VISIT: CXR done today-  Daily cough still productive of yellow sputum but  no fever or chest pain. He has improved significantly since hospitalized in April with pneumonia. He has been using Advair once daily and avoiding use of nebulizer which he blamed on making him "sick". Home oxygen 2 L since last hospital stay. Ambulating on room air today drop saturation to 86%. He came with tank oxygen which is too heavy for him to manage. They ask  about Prevnar. CXR 04/06/14 IMPRESSION:  Stable bilateral basilar interstitial densities are noted compared  to prior exam, most consistent with pulmonary fibrosis or chronic  interstitial lung disease. Superimposed edema or pneumonia cannot be  excluded. Probable emphysematous changes noted in both upper lobes.  Electronically Signed  By: [name] [name]  On: 04/06/2014 11:42  08/17/14- 56 yoM  former smoker here with his wife for f/u of asthma and allergic rhinitis, complicated by COPD and GERD  FOLLOWS FOR:c/o sorethroat since yesterday,hoarseness,took Zyrtec,cough-white,sob same,pnd,no fcs              Wife, daughter here They bring record listing O2 room air sat about 97% every day. He puts O2 on for 1 hour daily. O2 2L [facility] recent runny nose adn mild sore throat with clear sputum.  12/19/14- 85 yoM  former smoker here with his wife for f/u of asthma and allergic rhinitis, complicated by COPD and GERD  son here FOLLOWS JJO:ACZY on 3 rounds of abx in 2-3 mths., low grade fever at times,sob with exertion,no wheezing,cough-yellow,chest tightness occass. in ribs,fell in Dec. 2015-hurt hand.,had ONO in Nov. 2015. Arrived w O2 88%, improved w rest to 91% Primary physician in [place] has been treating fever/cough/shortness of breath which has been intermittent through the fall. Family stress complicating: He lives with wife who has Alzheimer's, one of his sisters has just died and others had a major stroke in brother is a nursing home. He sleeps all night every night with oxygen 2 L/[employer] and sometimes sits with it in the daytime but has no portable. He had had portable oxygen turned back in the past and doesn't think he needs it now. Admits dyspnea on exertion climbing stairs or hurrying. Cough "not bad" treated with Mucinex. He denies any sense of reflux, anything purulent or bloody, chest pain. CXR 09/13/15 reviewed note basilar fibrosis and  honeycombing IMPRESSION: Worsening bibasilar airspace opacification may reflect worsening fibrotic change, though superimposed pneumonia is a concern given the patient's symptoms. Electronically Signed  By: Garald Balding [name]  On: 09/12/2014 02:01  02/20/15-87 yoM  former smoker here with his wife for f/u of asthma/COPD, fibrosis and allergic rhinitis, complicated by GERD  daughter here Sleeps all night every night with oxygen 2 L/[employer] Follows For: SOB with exertion, prod cough in am's (cream) - Denies wheezing or chest tightness. We sent augmentin 1/14 for pneumonia. 12/19/14- LABS-WBC 13,400, ESR 79, ANA neg, GLU 154, Na 132 He says he is doing well now. He continues oxygen 2 L for sleep but doesn't like it. CXR 12/19/14- IMPRESSION: 1. Findings are consistent with right lower lung zone pneumonia superimposed on changes of interstitial fibrosis. There is also underlying COPD/emphysema. Electronically Signed  By: Lajean Manes [name]  On: 12/19/2014 15:34  06/26/15- 57 yoM  former smoker here with his wife for f/u of asthma/COPD, UIP and allergic rhinitis, complicated by GERD  daughter here FOLLOWS FOR: Pt states his breathing has been doing good since last visit.  Aware of dyspnea on exertion climbing steps but not much cough or wheeze and he doesn't exert much.  Doesn't get out much. He remains clear in his own mind that he does not want to start either of the available therapy medications for treating UIP. Because of that we have not scheduled a follow-up CT scan. Asks to renew handicap parking. Daughter used to work for [employer] and is able to engage family discussion of DNR. CXR 02/21/15 IMPRESSION: Stable bibasilar interstitial densities are noted most consistent with pulmonary fibrosis. No significant changes noted compared to prior exam. There is no definite evidence of acute pulmonary disease. Electronically Signed  By: Marijo Conception, [name]  On: 02/20/2015  13:03  11/29/2015-82 year old male former smoker followed for asthma/COPD, UIP, allergic rhinitis, complicated by GERD FOLLOWS FOR:Pt states that breathing has been doing good since last OV. Using 2 liters O2 at night while sleeping.  O2 2 L sleep [facility]   05/29/2016-82 year old male former smoker followed for asthma/COPD, UIP, allergic rhinitis, complicated by GERD O2 2 L sleep [facility] FOLLOWS FOR: Pt denies any wheezing, SOB, cough, or congestion to really speak of.  Using Advair one puff daily-discussed. Continues to need oxygen for sleep  ROS-see HPI Constitutional:   No-   weight loss, night sweats, fevers, chills,  +fatigue, lassitude. HEENT:   No-  headaches, difficulty swallowing, tooth/dental problems, sore throat,       No-  sneezing, itching, ear ache, nasal congestion, +post nasal drip,  CV:  No-   chest pain, orthopnea, PND, swelling in lower extremities, anasarca, dizziness, palpitations Resp: + shortness of breath with exertion or at rest.              No- productive cough,   non-productive cough,  No- coughing up of blood.              No-   change in color of mucus.  No- wheezing.   Skin: No-   rash or lesions. GI:  No-   heartburn, indigestion, abdominal pain, nausea, vomiting,  GU: MS:  No-   joint pain or swelling.   Neuro-     nothing unusual Psych:  No- change in mood or affect. No depression or anxiety.  ? memory loss.  Objective:   OBJ- Physical Exam General- Alert, Oriented, Affect-appropriate, Distress- none acute Skin- Clear Lymphadenopathy- none Head- atraumatic            Eyes- Gross vision intact, PERRLA, conjunctivae and secretions clear            Ears- +HOH-bilateral hearing aids            Nose- Clear, no-Septal dev, mucus, polyps, erosion, perforation             Throat- Mallampati II , mucosa clear , drainage- none, tonsils- atrophic Neck- flexible , trachea midline, no stridor , thyroid nl, carotid no bruit Chest -  symmetrical excursion , unlabored           Heart/CV- +RRR with extra beats , no murmur , no gallop  , no rub, nl s1 s2                           - JVD -none, edema- none, stasis changes- none, varices- none           Lung- + few crackles in bases, unlabored on room air , wheeze- none, cough-none , dullness-none, rub- none           Chest wall-  Abd-  Br/ Gen/ Rectal- Not  done, not indicated Extrem- cyanosis- none, clubbing- none, atrophy- none, strength- nl Neuro- grossly intact to observation

## 2016-05-29 NOTE — Patient Instructions (Signed)
Order- CXR    Dx Asthma/ fibrosis  Ok to continue Advair 1 puff daily. If your breathing gets worse you can try using the Advair twice daily  Please call if we can help

## 2016-06-01 NOTE — Assessment & Plan Note (Signed)
Persistent need for sleep O2.

## 2016-06-01 NOTE — Assessment & Plan Note (Signed)
Continue emphasis on reflux precautions

## 2016-06-01 NOTE — Assessment & Plan Note (Signed)
Specific process not yet clear. He had previously decided he did not want perfenidone. This may actually be result of chronic low-grade aspiration. Plan-follow-up chest x-ray

## 2016-09-24 ENCOUNTER — Telehealth: Payer: Self-pay | Admitting: Internal Medicine

## 2016-09-24 NOTE — Telephone Encounter (Signed)
LM for pt. Unable to talk to pt daughter as she is not on his dpr.  Will await call back.

## 2016-09-24 NOTE — Telephone Encounter (Signed)
Patient gave verbal permission for [place] to speak with Daughter, Renton Handrick [phone].  Patient states that he has an oxygen concentrator in his bedroom.  A wall away from his concentrator and 25 feet from his room, they have a gas stove that they use for heating their home in the winter.  Physical Therapist scared the  patient because they advised patient that the gas stove is a danger with the oxygen concentrator. They advised him that if he leaves the oxygen running, it can cause the gas stove to explode.  Patient wants to know if there is a risk with his concentrator and his gas stove even though the concentrator is in the next room over.   Dr. Annamaria Boots, please advise. (patient aware that Dr. Annamaria Boots is gone for the day and ok to wait to hear back from him tomorrow)

## 2016-09-24 NOTE — Telephone Encounter (Signed)
Dianne Dun, patient's daughter in law, returned call.  I advised we do not have copy of POA or DPR to speak to her or her husband, patient's son.  Patient must give permission. She is asking [place] to call him (patient) at [phone]. States will bring POA papers by the office.

## 2016-09-25 NOTE — Telephone Encounter (Signed)
I doubt that there is a problem, as long as he keeps oxygen line away from the flame. Suggest he ask his DME company that provides the O2 to take a look at his home set-up.

## 2016-09-25 NOTE — Telephone Encounter (Signed)
Spoke with pt's daughter, [place]. She is aware of CY's response. Nothing further was needed.

## 2016-11-26 ENCOUNTER — Telehealth: Payer: Self-pay | Admitting: Internal Medicine

## 2016-11-26 NOTE — Telephone Encounter (Signed)
Spoke with the pt's spouse Jurgensen  She states pt switching from [employer] supply to [facility]  He only uses o2 at night  Is this okay to go ahead and order or do you think he needs another ONO? Please advise, thanks!

## 2016-11-27 ENCOUNTER — Other Ambulatory Visit: Payer: Self-pay

## 2016-11-27 DIAGNOSIS — J849 Interstitial pulmonary disease, unspecified: Secondary | ICD-10-CM

## 2016-11-27 NOTE — Telephone Encounter (Signed)
Ok to order DME change for O2 2L for sleep as requested. They may request prior ONOX on room air. If they require a new one then we will have to schedule.

## 2016-11-27 NOTE — Telephone Encounter (Signed)
Spoke with daughter Fawson, informed her of CY recc. Explained to her that they may request another ONO and she said that would be fine. The order has been placed nothing further is needed at this time. Deneise Lever, MD  [facility] Triage Pool 16 minutes ago (9:04 AM)    Ok to order DME change for O2 2L for sleep as requested. They may request prior ONOX on room air. If they require a new one then we will have to schedule.   Documentation

## 2016-11-28 ENCOUNTER — Telehealth: Payer: Self-pay | Admitting: Internal Medicine

## 2016-11-28 NOTE — Telephone Encounter (Signed)
Pt has upcoming apt with CY on 12-18-16 for a qualifying walk. Pt's daughter is aware and voiced her understanding. Nothing further needed.

## 2016-11-28 NOTE — Telephone Encounter (Signed)
Spoke with Corene Cornea with [facility], who states pt is switching to [facility]. In order for pt to get his night time O2 with [facility], pt will need a OV and ONO. I have left a message to get pt scheduled.

## 2016-11-28 NOTE — Telephone Encounter (Signed)
Patient returned phone call. Contact # [phone].[name]

## 2016-12-09 DIAGNOSIS — Z85828 Personal history of other malignant neoplasm of skin: Secondary | ICD-10-CM | POA: Diagnosis not present

## 2016-12-09 DIAGNOSIS — C44629 Squamous cell carcinoma of skin of left upper limb, including shoulder: Secondary | ICD-10-CM | POA: Diagnosis not present

## 2016-12-09 DIAGNOSIS — L570 Actinic keratosis: Secondary | ICD-10-CM | POA: Diagnosis not present

## 2016-12-09 DIAGNOSIS — D489 Neoplasm of uncertain behavior, unspecified: Secondary | ICD-10-CM | POA: Diagnosis not present

## 2016-12-09 DIAGNOSIS — C44612 Basal cell carcinoma of skin of right upper limb, including shoulder: Secondary | ICD-10-CM | POA: Diagnosis not present

## 2016-12-09 DIAGNOSIS — L921 Necrobiosis lipoidica, not elsewhere classified: Secondary | ICD-10-CM | POA: Diagnosis not present

## 2016-12-09 DIAGNOSIS — L821 Other seborrheic keratosis: Secondary | ICD-10-CM | POA: Diagnosis not present

## 2016-12-09 DIAGNOSIS — L578 Other skin changes due to chronic exposure to nonionizing radiation: Secondary | ICD-10-CM | POA: Diagnosis not present

## 2016-12-15 DIAGNOSIS — J449 Chronic obstructive pulmonary disease, unspecified: Secondary | ICD-10-CM | POA: Diagnosis not present

## 2016-12-15 DIAGNOSIS — J189 Pneumonia, unspecified organism: Secondary | ICD-10-CM | POA: Diagnosis not present

## 2016-12-17 ENCOUNTER — Ambulatory Visit: Payer: Medicare HMO | Admitting: Adult Health

## 2016-12-18 ENCOUNTER — Encounter: Payer: Self-pay | Admitting: Internal Medicine

## 2016-12-18 ENCOUNTER — Ambulatory Visit (INDEPENDENT_AMBULATORY_CARE_PROVIDER_SITE_OTHER): Payer: PPO | Admitting: Internal Medicine

## 2016-12-18 VITALS — BP 114/76 | HR 85 | Ht 66.0 in | Wt 148.4 lb

## 2016-12-18 DIAGNOSIS — J849 Interstitial pulmonary disease, unspecified: Secondary | ICD-10-CM | POA: Diagnosis not present

## 2016-12-18 DIAGNOSIS — J438 Other emphysema: Secondary | ICD-10-CM | POA: Diagnosis not present

## 2016-12-18 DIAGNOSIS — J449 Chronic obstructive pulmonary disease, unspecified: Secondary | ICD-10-CM | POA: Diagnosis not present

## 2016-12-18 DIAGNOSIS — J9611 Chronic respiratory failure with hypoxia: Secondary | ICD-10-CM | POA: Diagnosis not present

## 2016-12-18 NOTE — Patient Instructions (Addendum)
Order- DME [facility]     Schedule ONOX on room air   Dx  COPD mixed type  Refill meds   Order- DME [facility]- Please provide Incentive Spirometer    Inhale deeply twice daily to help keep lungs open

## 2016-12-18 NOTE — Assessment & Plan Note (Signed)
He describes minimal respiratory symptoms with stable exertional dyspnea. He gets a lot of family support. He likes to use an incentive spirometer. I think he is at baseline with best realistic control pill for now.

## 2016-12-18 NOTE — Progress Notes (Signed)
Patient ID: [name], male    DOB: 04/22/1927, 83 y.o.   MRN: [id number] HPI male former smoker followed for asthma/COPD, chronic hypoxic resp failure,, UIP, allergic rhinitis, complicated by GERD  ------------------------------------------------- 05/29/2016-83 year old male former smoker followed for asthma/COPD, UIP, allergic rhinitis, complicated by GERD O2 2 L sleep [place] DME FOLLOWS FOR: Pt denies any wheezing, SOB, cough, or congestion to really speak of.  Using Advair one puff daily-discussed. Continues to need oxygen for sleep  12/18/2016-83 year old male former smoker followed for asthma/COPD, UIP, allergic rhinitis, complicated by GERD O2 2 L sleep [facility] FOLLOWS FOR: Pt would like to have new incentive spriometer-daughter states patient is doing well with it. Pt will need refill for Advair sent electronically. Pt states his breathing is doing well. Wife and daughter here today agree he has been stable. They credit a probiotic he is taking. Stable dyspnea on exertion. He does not feel need for daytime oxygen not much cough or wheeze. CXR 05/29/2016 IMPRESSION: No acute cardiopulmonary disease.  ROS-see HPI Constitutional:   No-   weight loss, night sweats, fevers, chills,  +fatigue, lassitude. HEENT:   No-  headaches, difficulty swallowing, tooth/dental problems, sore throat,       No-  sneezing, itching, ear ache, nasal congestion, post nasal drip,  CV:  No-   chest pain, orthopnea, PND, swelling in lower extremities, anasarca, dizziness, palpitations Resp: + shortness of breath with exertion or at rest.              No- productive cough,   non-productive cough,  No- coughing up of blood.              No-   change in color of mucus.  No- wheezing.   Skin: No-   rash or lesions. GI:  No-   heartburn, indigestion, abdominal pain, nausea, vomiting,  GU: MS:  No-   joint pain or swelling.   Neuro-     nothing unusual Psych:  No- change in mood or affect. No  depression or anxiety.  ? memory loss.  Objective:   OBJ- Physical Exam General- Alert, Oriented, Affect-appropriate, Distress- none acute Skin- Clear Lymphadenopathy- none Head- atraumatic            Eyes- Gross vision intact, PERRLA, conjunctivae and secretions clear            Ears- +HOH-bilateral hearing aids            Nose- Clear, no-Septal dev, mucus, polyps, erosion, perforation             Throat- Mallampati II , mucosa clear , drainage- none, tonsils- atrophic Neck- flexible , trachea midline, no stridor , thyroid nl, carotid no bruit Chest - symmetrical excursion , unlabored           Heart/CV- +RRR with extra beats , no murmur , no gallop  , no rub, nl s1 s2                           - JVD -none, edema- none, stasis changes- none, varices- none           Lung- , unlabored on room air , wheeze + trace upper lobes, cough-none ,                            dullness-none, rub- none  Chest wall-  Abd-  Br/ Gen/ Rectal- Not done, not indicated Extrem- cyanosis- none, clubbing- none, atrophy- none, strength- nl Neuro- grossly intact to observation

## 2016-12-18 NOTE — Assessment & Plan Note (Signed)
We have not seen significant progression, following just with CXR. At his age, even active UIP but not justify specific treatment.

## 2016-12-18 NOTE — Assessment & Plan Note (Signed)
We will update overnight oximetry on room air to support continued home oxygen use during sleep.

## 2016-12-19 ENCOUNTER — Telehealth: Payer: Self-pay | Admitting: Internal Medicine

## 2016-12-19 NOTE — Telephone Encounter (Signed)
Spoke with Corene Cornea at [facility], states they received a staff message from [place] asking to schedule an ONO with pt and to provide an incentive spirometer- pt is currently receiving 02 through [facility].  Corene Cornea is asking if we are doing this to switch pt to [facility], or if this was a mistake and ONO/incentive spirometer needs to be ordered through [facility] medical supply.   No mention in yesterday's OV about switching DME companies.   CY please advise.  Thanks!

## 2016-12-19 NOTE — Telephone Encounter (Signed)
lmtcb x1 for pt. 

## 2016-12-19 NOTE — Telephone Encounter (Signed)
Family told me they were using [facility]. Please clarify with daughter which DME they want. Ok to change order to [facility], or order DME change for O2 to [facility].

## 2016-12-22 NOTE — Telephone Encounter (Signed)
Lm x2

## 2016-12-23 NOTE — Telephone Encounter (Signed)
lmtcb x3 for pt. 

## 2016-12-26 NOTE — Telephone Encounter (Signed)
Spoke with pt's daughter, Laverde. She states that pt is having to switch to [facility] from [facility] due to his insurance changing. The ONO is being done to qualify him for his nigh time oxygen. Attempted to call Corene Cornea at [facility]. No answer, no option to leave a message. Will try back.

## 2016-12-29 NOTE — Telephone Encounter (Signed)
lmtcb for Jason.  

## 2016-12-29 NOTE — Telephone Encounter (Signed)
Spoke with [name] at [facility], states that they will be able to perform ONO to qualify patient for nocturnal 02, and will be able to supply incentive spirometer, but pt will need to come in for a qualifying walk for daytime 02 to be supplied.  lmtcb X1 for pt to schedule qualifying walk test.

## 2016-12-29 NOTE — Telephone Encounter (Signed)
[name] returning call.[name] ° °

## 2016-12-30 NOTE — Telephone Encounter (Signed)
lmtcb for Melissa at [facility].  

## 2016-12-30 NOTE — Telephone Encounter (Signed)
Return call from [place].Hillery Hunter

## 2016-12-30 NOTE — Telephone Encounter (Signed)
Called and spoke with pt's daughter, Fleece. Informed her [facility] should be contacting her to schedule ONO for pt. Pt is not wearing O2 during the day so he will not need a qualifying walk. [place] is frustrated that [facility] has not yet reached out to them to schedule ONO. There is an order for ONO and to start nocturnal O2 both to [facility].   Called Melissa, with [facility]. She states she will look into why pt hasnt had ONO yet and will call back.

## 2016-12-31 DIAGNOSIS — I10 Essential (primary) hypertension: Secondary | ICD-10-CM | POA: Diagnosis not present

## 2016-12-31 NOTE — Telephone Encounter (Signed)
lmtcb x2 for Melissa with [facility] 

## 2016-12-31 NOTE — Telephone Encounter (Signed)
Spoke with Melissa at [facility], states that there was an order for an incentive spirometer and an ONO, but pt was established already with another DME company for his 02, which caused some confusion- per below, it wasn't until 1/22 that [place] with [facility] was made aware that this was a transition of care to [facility].   Pt's ONO order is being processed and pt will be contacted for scheduling.  Per below, pt's daughter is already aware of this.  Nothing further needed.

## 2016-12-31 NOTE — Telephone Encounter (Signed)
Melissa calling back.  [phone]

## 2017-01-01 ENCOUNTER — Telehealth: Payer: Self-pay | Admitting: Internal Medicine

## 2017-01-01 NOTE — Telephone Encounter (Signed)
Called and lmomtcb for jason from [facility] to find out what is needed to get the oxygen for the pt.    I called to make paula aware of this and she stated that all that is needed is to call the insurance to give the authorization for the oxygen.  She is aware that we are waiting for [facility] to call back.

## 2017-01-06 NOTE — Telephone Encounter (Signed)
lmtcb for Jason at [facility] 

## 2017-01-07 ENCOUNTER — Telehealth: Payer: Self-pay | Admitting: Internal Medicine

## 2017-01-07 MED ORDER — FLUTICASONE-SALMETEROL 100-50 MCG/DOSE IN AEPB
1.0000 | INHALATION_SPRAY | Freq: Two times a day (BID) | RESPIRATORY_TRACT | 3 refills | Status: DC
Start: 2017-01-07 — End: 2018-01-07

## 2017-01-07 MED ORDER — FLUTICASONE-SALMETEROL 100-50 MCG/DOSE IN AEPB: 1.0000 | INHALATION_SPRAY | Freq: Two times a day (BID) | RESPIRATORY_TRACT | 0 refills | Status: DC

## 2017-01-07 NOTE — Telephone Encounter (Signed)
Spoke with pt's daughter, who states at pt's OV with Cy on 12/17/16, pt was told that a rx for adviar would be sent in. Donisi states this was never done, I have apologized to [place]. [place] has requested that we send a 1 month supply to pt's local pharmacy and a 3 month supply to [facility]. Rx has been sent to preferred pharmacy. Pt's daughter is aware & voiced her understanding. Nothing further needed.

## 2017-01-07 NOTE — Telephone Encounter (Signed)
Advair was not called in she is a bit upset doesn't understand why its so hard they had a sample he only has 14 days left which mail order will not make it in time she states she gave them all of this info when they were here

## 2017-01-08 NOTE — Telephone Encounter (Signed)
lmtcb x3 for [facility].

## 2017-01-09 NOTE — Telephone Encounter (Signed)
Spoke with Corene Cornea, who stateseverything has been taken care of, as ONO on rm as been ordered to get pt qualified. Corene Cornea states [facility] is currently waiting on pt to call back to get ONO scheduled.  I have spoke with pt daughter, Trask and made her aware of this. Wenzinger states she will contact [place] today to get pt scheduled.  Nothing further needed.

## 2017-01-12 DIAGNOSIS — J449 Chronic obstructive pulmonary disease, unspecified: Secondary | ICD-10-CM | POA: Diagnosis not present

## 2017-01-12 DIAGNOSIS — J432 Centrilobular emphysema: Secondary | ICD-10-CM | POA: Diagnosis not present

## 2017-01-13 ENCOUNTER — Encounter: Payer: Self-pay | Admitting: Internal Medicine

## 2017-01-13 DIAGNOSIS — J449 Chronic obstructive pulmonary disease, unspecified: Secondary | ICD-10-CM | POA: Diagnosis not present

## 2017-01-13 DIAGNOSIS — J432 Centrilobular emphysema: Secondary | ICD-10-CM | POA: Diagnosis not present

## 2017-01-15 DIAGNOSIS — J449 Chronic obstructive pulmonary disease, unspecified: Secondary | ICD-10-CM | POA: Diagnosis not present

## 2017-01-15 DIAGNOSIS — J189 Pneumonia, unspecified organism: Secondary | ICD-10-CM | POA: Diagnosis not present

## 2017-01-28 ENCOUNTER — Telehealth: Payer: Self-pay | Admitting: Internal Medicine

## 2017-01-28 DIAGNOSIS — C44629 Squamous cell carcinoma of skin of left upper limb, including shoulder: Secondary | ICD-10-CM | POA: Diagnosis not present

## 2017-01-28 DIAGNOSIS — J9611 Chronic respiratory failure with hypoxia: Secondary | ICD-10-CM

## 2017-01-28 DIAGNOSIS — J449 Chronic obstructive pulmonary disease, unspecified: Secondary | ICD-10-CM

## 2017-01-28 DIAGNOSIS — C44612 Basal cell carcinoma of skin of right upper limb, including shoulder: Secondary | ICD-10-CM | POA: Diagnosis not present

## 2017-01-28 NOTE — Telephone Encounter (Signed)
Received call from [place] asking for assistance with patient's daughter Pawelski regarding pt's O2 Spoke with pt's daughter Waldeck who is upset because they have been waiting to switch pt's O2 from [facility] to [facility] (ONO was ordered at [facility] w/ CDY to qualify patient).  Per Nevin Bloodgood ONO was done approximately 2 weeks ago and they have yet to hear results.  Informed [place] will look for results and call her back >> [phone].  Spoke with CDY and checked his cart - no sign of the ONO Checked the fax and his folder up front - no sign of the ONO [facility], spoke with Glenard Haring who stated the ONO was faxed at 4:02pm (now 4:21PM).  Placed Angel on hold to search - no sign of the ONO.  Angel to refax the ONO "right now" to [phone].  At 4:45pm, no sign of the ONO Called spoke with [place] to provide update - she voiced her understanding and is okay with continued follow up tomorrow.  Informed her she may ask for me personally [facility] again and spoke with Janett Billow.  Was placed on hold while she found someone that could help me.  While I was on hold, ONO was delivered to me from the fax machine.  ONO placed on CDY's desk for review and a note letting him know he can route results to back to me. Called [place] to inform her that the ONO has been received  Dr Annamaria Boots please advise on results/recs, thank you.

## 2017-01-29 NOTE — Telephone Encounter (Signed)
His overnight oximetry does show his O2 level drops enough to continue needing O2 2L for sleep. They were needing to change from [facility] to [facility] as DME   For dx chronic respiratory failure with hypoxia, COPD mixed type.

## 2017-01-29 NOTE — Telephone Encounter (Signed)
Order placed for nocturnal O2 through [facility] and message sent to [place] with [facility] to see if the order can be expedited.  Have also spoken with CDY to see if he can go ahead and sign off on the order.  Called spoke with patient's daughter and informed her of the above.  [name] was very Patent attorney.  She will call the office back if she does not hear from [facility] within the next couple of days.  Nothing further needed; will sign off ONO placed in CDY's scan folder

## 2017-02-02 DIAGNOSIS — J432 Centrilobular emphysema: Secondary | ICD-10-CM | POA: Diagnosis not present

## 2017-02-02 DIAGNOSIS — J449 Chronic obstructive pulmonary disease, unspecified: Secondary | ICD-10-CM | POA: Diagnosis not present

## 2017-02-02 DIAGNOSIS — J849 Interstitial pulmonary disease, unspecified: Secondary | ICD-10-CM | POA: Diagnosis not present

## 2017-02-02 DIAGNOSIS — J438 Other emphysema: Secondary | ICD-10-CM | POA: Diagnosis not present

## 2017-02-05 DIAGNOSIS — Z483 Aftercare following surgery for neoplasm: Secondary | ICD-10-CM | POA: Diagnosis not present

## 2017-02-05 DIAGNOSIS — C44612 Basal cell carcinoma of skin of right upper limb, including shoulder: Secondary | ICD-10-CM | POA: Diagnosis not present

## 2017-02-05 DIAGNOSIS — C44629 Squamous cell carcinoma of skin of left upper limb, including shoulder: Secondary | ICD-10-CM | POA: Diagnosis not present

## 2017-02-11 DIAGNOSIS — C44612 Basal cell carcinoma of skin of right upper limb, including shoulder: Secondary | ICD-10-CM | POA: Diagnosis not present

## 2017-02-11 DIAGNOSIS — C44629 Squamous cell carcinoma of skin of left upper limb, including shoulder: Secondary | ICD-10-CM | POA: Diagnosis not present

## 2017-02-11 DIAGNOSIS — Z483 Aftercare following surgery for neoplasm: Secondary | ICD-10-CM | POA: Diagnosis not present

## 2017-03-02 DIAGNOSIS — J438 Other emphysema: Secondary | ICD-10-CM | POA: Diagnosis not present

## 2017-03-02 DIAGNOSIS — J849 Interstitial pulmonary disease, unspecified: Secondary | ICD-10-CM | POA: Diagnosis not present

## 2017-03-02 DIAGNOSIS — J449 Chronic obstructive pulmonary disease, unspecified: Secondary | ICD-10-CM | POA: Diagnosis not present

## 2017-03-02 DIAGNOSIS — J432 Centrilobular emphysema: Secondary | ICD-10-CM | POA: Diagnosis not present

## 2017-03-11 DIAGNOSIS — H353130 Nonexudative age-related macular degeneration, bilateral, stage unspecified: Secondary | ICD-10-CM | POA: Diagnosis not present

## 2017-03-11 DIAGNOSIS — H401134 Primary open-angle glaucoma, bilateral, indeterminate stage: Secondary | ICD-10-CM | POA: Diagnosis not present

## 2017-03-11 DIAGNOSIS — Z961 Presence of intraocular lens: Secondary | ICD-10-CM | POA: Diagnosis not present

## 2017-03-12 DIAGNOSIS — C44612 Basal cell carcinoma of skin of right upper limb, including shoulder: Secondary | ICD-10-CM | POA: Diagnosis not present

## 2017-03-12 DIAGNOSIS — Z483 Aftercare following surgery for neoplasm: Secondary | ICD-10-CM | POA: Diagnosis not present

## 2017-03-12 DIAGNOSIS — C44629 Squamous cell carcinoma of skin of left upper limb, including shoulder: Secondary | ICD-10-CM | POA: Diagnosis not present

## 2017-04-02 DIAGNOSIS — J438 Other emphysema: Secondary | ICD-10-CM | POA: Diagnosis not present

## 2017-04-02 DIAGNOSIS — J449 Chronic obstructive pulmonary disease, unspecified: Secondary | ICD-10-CM | POA: Diagnosis not present

## 2017-04-02 DIAGNOSIS — J849 Interstitial pulmonary disease, unspecified: Secondary | ICD-10-CM | POA: Diagnosis not present

## 2017-04-02 DIAGNOSIS — J432 Centrilobular emphysema: Secondary | ICD-10-CM | POA: Diagnosis not present

## 2017-04-08 DIAGNOSIS — R739 Hyperglycemia, unspecified: Secondary | ICD-10-CM | POA: Diagnosis not present

## 2017-04-08 DIAGNOSIS — J441 Chronic obstructive pulmonary disease with (acute) exacerbation: Secondary | ICD-10-CM | POA: Diagnosis not present

## 2017-04-10 DIAGNOSIS — L570 Actinic keratosis: Secondary | ICD-10-CM | POA: Diagnosis not present

## 2017-04-10 DIAGNOSIS — L821 Other seborrheic keratosis: Secondary | ICD-10-CM | POA: Diagnosis not present

## 2017-04-10 DIAGNOSIS — Z85828 Personal history of other malignant neoplasm of skin: Secondary | ICD-10-CM | POA: Diagnosis not present

## 2017-04-10 DIAGNOSIS — Z87891 Personal history of nicotine dependence: Secondary | ICD-10-CM | POA: Diagnosis not present

## 2017-04-16 DIAGNOSIS — J441 Chronic obstructive pulmonary disease with (acute) exacerbation: Secondary | ICD-10-CM | POA: Diagnosis not present

## 2017-04-16 DIAGNOSIS — N400 Enlarged prostate without lower urinary tract symptoms: Secondary | ICD-10-CM | POA: Diagnosis not present

## 2017-04-16 DIAGNOSIS — G629 Polyneuropathy, unspecified: Secondary | ICD-10-CM | POA: Diagnosis not present

## 2017-04-16 DIAGNOSIS — I10 Essential (primary) hypertension: Secondary | ICD-10-CM | POA: Diagnosis not present

## 2017-04-16 DIAGNOSIS — K219 Gastro-esophageal reflux disease without esophagitis: Secondary | ICD-10-CM | POA: Diagnosis not present

## 2017-04-30 DIAGNOSIS — R197 Diarrhea, unspecified: Secondary | ICD-10-CM | POA: Diagnosis not present

## 2017-05-02 DIAGNOSIS — J449 Chronic obstructive pulmonary disease, unspecified: Secondary | ICD-10-CM | POA: Diagnosis not present

## 2017-05-02 DIAGNOSIS — J849 Interstitial pulmonary disease, unspecified: Secondary | ICD-10-CM | POA: Diagnosis not present

## 2017-05-02 DIAGNOSIS — J432 Centrilobular emphysema: Secondary | ICD-10-CM | POA: Diagnosis not present

## 2017-05-02 DIAGNOSIS — J438 Other emphysema: Secondary | ICD-10-CM | POA: Diagnosis not present

## 2017-05-22 DIAGNOSIS — B370 Candidal stomatitis: Secondary | ICD-10-CM | POA: Diagnosis not present

## 2017-05-22 DIAGNOSIS — R05 Cough: Secondary | ICD-10-CM | POA: Diagnosis not present

## 2017-05-22 DIAGNOSIS — J181 Lobar pneumonia, unspecified organism: Secondary | ICD-10-CM | POA: Diagnosis not present

## 2017-05-28 DIAGNOSIS — B370 Candidal stomatitis: Secondary | ICD-10-CM | POA: Diagnosis not present

## 2017-05-28 DIAGNOSIS — J181 Lobar pneumonia, unspecified organism: Secondary | ICD-10-CM | POA: Diagnosis not present

## 2017-06-02 DIAGNOSIS — J449 Chronic obstructive pulmonary disease, unspecified: Secondary | ICD-10-CM | POA: Diagnosis not present

## 2017-06-02 DIAGNOSIS — J438 Other emphysema: Secondary | ICD-10-CM | POA: Diagnosis not present

## 2017-06-02 DIAGNOSIS — J432 Centrilobular emphysema: Secondary | ICD-10-CM | POA: Diagnosis not present

## 2017-06-02 DIAGNOSIS — J849 Interstitial pulmonary disease, unspecified: Secondary | ICD-10-CM | POA: Diagnosis not present

## 2017-06-17 DIAGNOSIS — H401134 Primary open-angle glaucoma, bilateral, indeterminate stage: Secondary | ICD-10-CM | POA: Diagnosis not present

## 2017-06-26 DIAGNOSIS — L570 Actinic keratosis: Secondary | ICD-10-CM | POA: Diagnosis not present

## 2017-07-02 ENCOUNTER — Ambulatory Visit (INDEPENDENT_AMBULATORY_CARE_PROVIDER_SITE_OTHER): Payer: PPO | Admitting: Internal Medicine

## 2017-07-02 ENCOUNTER — Encounter: Payer: Self-pay | Admitting: Internal Medicine

## 2017-07-02 DIAGNOSIS — J9611 Chronic respiratory failure with hypoxia: Secondary | ICD-10-CM

## 2017-07-02 DIAGNOSIS — J432 Centrilobular emphysema: Secondary | ICD-10-CM | POA: Diagnosis not present

## 2017-07-02 DIAGNOSIS — J849 Interstitial pulmonary disease, unspecified: Secondary | ICD-10-CM

## 2017-07-02 DIAGNOSIS — J449 Chronic obstructive pulmonary disease, unspecified: Secondary | ICD-10-CM | POA: Diagnosis not present

## 2017-07-02 DIAGNOSIS — J438 Other emphysema: Secondary | ICD-10-CM | POA: Diagnosis not present

## 2017-07-02 DIAGNOSIS — J439 Emphysema, unspecified: Secondary | ICD-10-CM | POA: Diagnosis not present

## 2017-07-02 NOTE — Patient Instructions (Signed)
Ok to continue Advair.  If your insurance formulary would cover an alternative cheaper ( Symbicort, [place], [place]) , let us know and we can try to change to the cheapest.   Ok to continue Oxygen at night  Please call if we can help

## 2017-07-02 NOTE — Assessment & Plan Note (Signed)
Probably close to his baseline with COPD still dependent on oxygen for sleep but able to get by with Advair just once daily. I explained to his daughter how to check insurance formulary for cheaper alternatives if available.

## 2017-07-02 NOTE — Progress Notes (Signed)
Patient ID: [name], male    DOB: Jun 26, 1927, 83 y.o.   MRN: [id number] HPI male former smoker followed for asthma/COPD, chronic hypoxic resp failure,, UIP, allergic rhinitis, complicated by GERD  -------------------------------------------------  12/18/2016-83 year old male former smoker followed for asthma/COPD, UIP, allergic rhinitis, complicated by GERD O2 2 L sleep [facility] FOLLOWS FOR: Pt would like to have new incentive spriometer-daughter states patient is doing well with it. Pt will need refill for Advair sent electronically. Pt states his breathing is doing well. Wife and daughter here today agree he has been stable. They credit a probiotic he is taking. Stable dyspnea on exertion. He does not feel need for daytime oxygen not much cough or wheeze. CXR 05/29/2016 IMPRESSION: No acute cardiopulmonary disease.  07/02/17- 83 year old male former smoker followed for asthma/COPD, UIP, allergic rhinitis, complicated by GERD, glaucoma O2 2 L sleep [facility] Treated by PCP for community-acquired pneumonia RML in June, treated with Rocephin, Zithromax, prednisone CXR 05/23/17-"chronic interstitial lung disease without significant superimposed abnormality " (Care Everywhere [facility]) FOLLOWS FOR: Pt states he is doing well. Denies any wheezing or SOB. Pt has cough and congestion at times-had used Mucinex about 1 month ago. Was seen by PCP 05-06-17 for PNA. Daughter comes with him and helps manage things. She says he is using his incentive spirometer once a day. Uses Advair once daily because of cost. Continues oxygen for sleep. Stable at his baseline after resolving pneumonia a month or so ago.  ROS-see HPI  + = pos Constitutional:   No-   weight loss, night sweats, fevers, chills,  +fatigue, lassitude. HEENT:   No-  headaches, difficulty swallowing, tooth/dental problems, sore throat,       No-  sneezing, itching, ear ache, nasal congestion, post nasal drip,  CV:  No-   chest pain,  orthopnea, PND, swelling in lower extremities, anasarca, dizziness, palpitations Resp: + shortness of breath with exertion or at rest.              No- productive cough,   non-productive cough,  No- coughing up of blood.              No-   change in color of mucus.  No- wheezing.   Skin: No-   rash or lesions. GI:  No-   heartburn, indigestion, abdominal pain, nausea, vomiting,  GU: MS:  No-   joint pain or swelling.   Neuro-     nothing unusual Psych:  No- change in mood or affect. No depression or anxiety.  ? memory loss.  Objective:   OBJ- Physical Exam General- Alert, Oriented, Affect-appropriate, Distress- none acute, room air Skin- Clear Lymphadenopathy- none Head- atraumatic            Eyes- Gross vision intact, PERRLA, conjunctivae and secretions clear            Ears- +HOH-bilateral hearing aids            Nose- Clear, no-Septal dev, mucus, polyps, erosion, perforation             Throat- Mallampati II , mucosa clear , drainage- none, tonsils- atrophic Neck- flexible , trachea midline, no stridor , thyroid nl, carotid no bruit Chest - symmetrical excursion , unlabored           Heart/CV- +RRR with extra beats , no murmur , no gallop  , no rub, nl s1 s2                           -  JVD -none, edema- none, stasis changes- none, varices- none           Lung- , unlabored on room air , wheeze-none, cough-none ,                            dullness-none, rub- none, + coarse breath sounds           Chest wall-  Abd-  Br/ Gen/ Rectal- Not done, not indicated Extrem- cyanosis- none, clubbing- none, atrophy- none, strength- nl Neuro- grossly intact to observation

## 2017-07-02 NOTE — Assessment & Plan Note (Signed)
He is frail at age 83. Neither product currently available for UIP would improve his quality of life. We will watch this issue with him.

## 2017-07-02 NOTE — Assessment & Plan Note (Signed)
He remains oxygen dependent with sleep. 2 L is appropriate.

## 2017-08-02 DIAGNOSIS — J432 Centrilobular emphysema: Secondary | ICD-10-CM | POA: Diagnosis not present

## 2017-08-02 DIAGNOSIS — J438 Other emphysema: Secondary | ICD-10-CM | POA: Diagnosis not present

## 2017-08-02 DIAGNOSIS — J449 Chronic obstructive pulmonary disease, unspecified: Secondary | ICD-10-CM | POA: Diagnosis not present

## 2017-08-02 DIAGNOSIS — J849 Interstitial pulmonary disease, unspecified: Secondary | ICD-10-CM | POA: Diagnosis not present

## 2017-08-27 DIAGNOSIS — K580 Irritable bowel syndrome with diarrhea: Secondary | ICD-10-CM | POA: Diagnosis not present

## 2017-08-27 DIAGNOSIS — R197 Diarrhea, unspecified: Secondary | ICD-10-CM | POA: Diagnosis not present

## 2017-08-27 DIAGNOSIS — F419 Anxiety disorder, unspecified: Secondary | ICD-10-CM | POA: Diagnosis not present

## 2017-09-02 DIAGNOSIS — J438 Other emphysema: Secondary | ICD-10-CM | POA: Diagnosis not present

## 2017-09-02 DIAGNOSIS — J849 Interstitial pulmonary disease, unspecified: Secondary | ICD-10-CM | POA: Diagnosis not present

## 2017-09-02 DIAGNOSIS — J432 Centrilobular emphysema: Secondary | ICD-10-CM | POA: Diagnosis not present

## 2017-09-02 DIAGNOSIS — J449 Chronic obstructive pulmonary disease, unspecified: Secondary | ICD-10-CM | POA: Diagnosis not present

## 2017-09-04 DIAGNOSIS — R197 Diarrhea, unspecified: Secondary | ICD-10-CM | POA: Diagnosis not present

## 2017-09-16 DIAGNOSIS — R351 Nocturia: Secondary | ICD-10-CM | POA: Diagnosis not present

## 2017-09-16 DIAGNOSIS — N403 Nodular prostate with lower urinary tract symptoms: Secondary | ICD-10-CM | POA: Diagnosis not present

## 2017-09-16 DIAGNOSIS — R197 Diarrhea, unspecified: Secondary | ICD-10-CM | POA: Diagnosis not present

## 2017-09-16 DIAGNOSIS — K219 Gastro-esophageal reflux disease without esophagitis: Secondary | ICD-10-CM | POA: Diagnosis not present

## 2017-09-16 DIAGNOSIS — R634 Abnormal weight loss: Secondary | ICD-10-CM | POA: Diagnosis not present

## 2017-10-02 DIAGNOSIS — J432 Centrilobular emphysema: Secondary | ICD-10-CM | POA: Diagnosis not present

## 2017-10-02 DIAGNOSIS — J438 Other emphysema: Secondary | ICD-10-CM | POA: Diagnosis not present

## 2017-10-02 DIAGNOSIS — J449 Chronic obstructive pulmonary disease, unspecified: Secondary | ICD-10-CM | POA: Diagnosis not present

## 2017-10-02 DIAGNOSIS — J849 Interstitial pulmonary disease, unspecified: Secondary | ICD-10-CM | POA: Diagnosis not present

## 2017-10-07 DIAGNOSIS — R634 Abnormal weight loss: Secondary | ICD-10-CM | POA: Diagnosis not present

## 2017-10-07 DIAGNOSIS — R197 Diarrhea, unspecified: Secondary | ICD-10-CM | POA: Diagnosis not present

## 2017-10-09 DIAGNOSIS — Z85828 Personal history of other malignant neoplasm of skin: Secondary | ICD-10-CM | POA: Diagnosis not present

## 2017-10-09 DIAGNOSIS — D229 Melanocytic nevi, unspecified: Secondary | ICD-10-CM | POA: Diagnosis not present

## 2017-10-09 DIAGNOSIS — Z1283 Encounter for screening for malignant neoplasm of skin: Secondary | ICD-10-CM | POA: Diagnosis not present

## 2017-10-09 DIAGNOSIS — L578 Other skin changes due to chronic exposure to nonionizing radiation: Secondary | ICD-10-CM | POA: Diagnosis not present

## 2017-10-09 DIAGNOSIS — L570 Actinic keratosis: Secondary | ICD-10-CM | POA: Diagnosis not present

## 2017-10-15 DIAGNOSIS — H353190 Nonexudative age-related macular degeneration, unspecified eye, stage unspecified: Secondary | ICD-10-CM | POA: Diagnosis not present

## 2017-11-02 DIAGNOSIS — J449 Chronic obstructive pulmonary disease, unspecified: Secondary | ICD-10-CM | POA: Diagnosis not present

## 2017-11-02 DIAGNOSIS — J438 Other emphysema: Secondary | ICD-10-CM | POA: Diagnosis not present

## 2017-11-02 DIAGNOSIS — J849 Interstitial pulmonary disease, unspecified: Secondary | ICD-10-CM | POA: Diagnosis not present

## 2017-11-02 DIAGNOSIS — J432 Centrilobular emphysema: Secondary | ICD-10-CM | POA: Diagnosis not present

## 2017-11-23 DIAGNOSIS — H401134 Primary open-angle glaucoma, bilateral, indeterminate stage: Secondary | ICD-10-CM | POA: Diagnosis not present

## 2017-12-02 DIAGNOSIS — J849 Interstitial pulmonary disease, unspecified: Secondary | ICD-10-CM | POA: Diagnosis not present

## 2017-12-02 DIAGNOSIS — J432 Centrilobular emphysema: Secondary | ICD-10-CM | POA: Diagnosis not present

## 2017-12-02 DIAGNOSIS — J438 Other emphysema: Secondary | ICD-10-CM | POA: Diagnosis not present

## 2017-12-02 DIAGNOSIS — J449 Chronic obstructive pulmonary disease, unspecified: Secondary | ICD-10-CM | POA: Diagnosis not present

## 2017-12-18 DIAGNOSIS — S76012A Strain of muscle, fascia and tendon of left hip, initial encounter: Secondary | ICD-10-CM | POA: Diagnosis not present

## 2017-12-31 DIAGNOSIS — H903 Sensorineural hearing loss, bilateral: Secondary | ICD-10-CM | POA: Diagnosis not present

## 2018-01-02 DIAGNOSIS — J449 Chronic obstructive pulmonary disease, unspecified: Secondary | ICD-10-CM | POA: Diagnosis not present

## 2018-01-02 DIAGNOSIS — J438 Other emphysema: Secondary | ICD-10-CM | POA: Diagnosis not present

## 2018-01-02 DIAGNOSIS — J849 Interstitial pulmonary disease, unspecified: Secondary | ICD-10-CM | POA: Diagnosis not present

## 2018-01-02 DIAGNOSIS — J432 Centrilobular emphysema: Secondary | ICD-10-CM | POA: Diagnosis not present

## 2018-01-07 ENCOUNTER — Ambulatory Visit: Payer: PPO | Admitting: Internal Medicine

## 2018-01-07 ENCOUNTER — Encounter: Payer: Self-pay | Admitting: Internal Medicine

## 2018-01-07 VITALS — BP 118/70 | HR 73 | Ht 66.0 in | Wt 136.4 lb

## 2018-01-07 DIAGNOSIS — J9611 Chronic respiratory failure with hypoxia: Secondary | ICD-10-CM

## 2018-01-07 DIAGNOSIS — J449 Chronic obstructive pulmonary disease, unspecified: Secondary | ICD-10-CM | POA: Diagnosis not present

## 2018-01-07 MED ORDER — FLUTICASONE-SALMETEROL 100-50 MCG/DOSE IN AEPB: 1.0000 | INHALATION_SPRAY | Freq: Two times a day (BID) | RESPIRATORY_TRACT | 3 refills | Status: DC

## 2018-01-07 NOTE — Progress Notes (Signed)
Patient ID: Evan MCEVERS, male    DOB: 06-14-1927, 84 y.o.   MRN: [id number] HPI male former smoker followed for asthma/COPD, chronic hypoxic resp failure,, UIP, allergic rhinitis, complicated by GERD  ------------------------------------------------  07/02/17- 84 year old male former smoker followed for asthma/COPD, UIP, allergic rhinitis, complicated by GERD, glaucoma O2 2 L sleep [facility] Treated by PCP for community-acquired pneumonia RML in June, treated with Rocephin, Zithromax, prednisone CXR 05/23/17-"chronic interstitial lung disease without significant superimposed abnormality " (Care Everywhere [facility]) FOLLOWS FOR: Pt states he is doing well. Denies any wheezing or SOB. Pt has cough and congestion at times-had used Mucinex about 1 month ago. Was seen by PCP 05-06-17 for PNA. Daughter comes with him and helps manage things. She says he is using his incentive spirometer once a day. Uses Advair once daily because of cost. Continues oxygen for sleep. Stable at his baseline after resolving pneumonia a month or so ago.  01/07/18- 84 year old male former smoker followed for asthma/COPD, UIP, allergic rhinitis, complicated by GERD, glaucoma O2 2 L sleep [facility] ----COPD mixed type: Pt wears O2 QHS through [facility]. Pt gets SOB with exertion still. Will need ONO to recert O2 needs.  Desat to 85% on room air on arrival. Advair 100 Better with oxygen.  Moves around very little at home and does not feel as much need for oxygen there.  ROS-see HPI  + = pos Constitutional:   No-   weight loss, night sweats, fevers, chills,  +fatigue, lassitude. HEENT:   No-  headaches, difficulty swallowing, tooth/dental problems, sore throat,       No-  sneezing, itching, ear ache, nasal congestion, post nasal drip,  CV:  No-   chest pain, orthopnea, PND, swelling in lower extremities, anasarca, dizziness, palpitations Resp: + shortness of breath with exertion or at rest.              No- productive cough,    non-productive cough,  No- coughing up of blood.              No-   change in color of mucus.  No- wheezing.   Skin: No-   rash or lesions. GI:  No-   heartburn, indigestion, abdominal pain, nausea, vomiting,  GU: MS:  No-   joint pain or swelling.   Neuro-     nothing unusual Psych:  No- change in mood or affect. No depression or anxiety.  ? memory loss.  Objective:   OBJ- Physical Exam General- Alert, Oriented, Affect-appropriate, Distress- none acute, room air Skin- Clear Lymphadenopathy- none Head- atraumatic            Eyes- Gross vision intact, PERRLA, conjunctivae and secretions clear            Ears- +HOH-bilateral hearing aids            Nose- Clear, no-Septal dev, mucus, polyps, erosion, perforation             Throat- Mallampati II , mucosa clear , drainage- none, tonsils- atrophic Neck- flexible , trachea midline, no stridor , thyroid nl, carotid no bruit Chest - symmetrical excursion , unlabored           Heart/CV- +RRR with extra beats , 2/6+ murmur , no gallop  , no rub, nl s1 s2                           - JVD -none, edema- none, stasis changes-  none, varices- none           Lung- , unlabored on room air , wheeze-none, cough-none ,                            dullness-none, rub- none, +clear           Chest wall-  Abd-  Br/ Gen/ Rectal- Not done, not indicated Extrem- cyanosis- none, clubbing- none, atrophy- none, strength- nl Neuro- grossly intact to observation

## 2018-01-07 NOTE — Patient Instructions (Signed)
Script sent refilling Advair maintenance inhaler to use daily  Order- DME [facility]- Continue O2    2.5L/ min for sleep, 2 L as needed for exertion                   Dx chronic hypoxic respiratory failure  Please call if we can help

## 2018-01-08 ENCOUNTER — Telehealth: Payer: Self-pay | Admitting: Internal Medicine

## 2018-01-08 DIAGNOSIS — J439 Emphysema, unspecified: Secondary | ICD-10-CM

## 2018-01-08 NOTE — Telephone Encounter (Signed)
New order has been placed on the oxygen template. Corene Cornea with [facility] is aware. Nothing further was needed.

## 2018-01-10 NOTE — Assessment & Plan Note (Addendum)
Requalifies for oxygen with sleep and activity 2 L.  I suggested he sleep on 2.5 L

## 2018-01-11 NOTE — Assessment & Plan Note (Signed)
Primarily emphysema with minimal bronchitis symptoms currently.  He never feels need for rescue inhaler but continues Advair 100.  We discussed medications.  He is frail, limited physical activity.  His wife and daughter make sure he gets his meds.

## 2018-02-02 DIAGNOSIS — J438 Other emphysema: Secondary | ICD-10-CM | POA: Diagnosis not present

## 2018-02-02 DIAGNOSIS — J849 Interstitial pulmonary disease, unspecified: Secondary | ICD-10-CM | POA: Diagnosis not present

## 2018-02-02 DIAGNOSIS — J449 Chronic obstructive pulmonary disease, unspecified: Secondary | ICD-10-CM | POA: Diagnosis not present

## 2018-02-02 DIAGNOSIS — J432 Centrilobular emphysema: Secondary | ICD-10-CM | POA: Diagnosis not present

## 2018-02-10 DIAGNOSIS — Z808 Family history of malignant neoplasm of other organs or systems: Secondary | ICD-10-CM | POA: Diagnosis not present

## 2018-02-10 DIAGNOSIS — Z1283 Encounter for screening for malignant neoplasm of skin: Secondary | ICD-10-CM | POA: Diagnosis not present

## 2018-02-10 DIAGNOSIS — L570 Actinic keratosis: Secondary | ICD-10-CM | POA: Diagnosis not present

## 2018-02-10 DIAGNOSIS — Z87891 Personal history of nicotine dependence: Secondary | ICD-10-CM | POA: Diagnosis not present

## 2018-02-10 DIAGNOSIS — Z85828 Personal history of other malignant neoplasm of skin: Secondary | ICD-10-CM | POA: Diagnosis not present

## 2018-02-10 DIAGNOSIS — L821 Other seborrheic keratosis: Secondary | ICD-10-CM | POA: Diagnosis not present

## 2018-02-10 DIAGNOSIS — L578 Other skin changes due to chronic exposure to nonionizing radiation: Secondary | ICD-10-CM | POA: Diagnosis not present

## 2018-02-10 DIAGNOSIS — D229 Melanocytic nevi, unspecified: Secondary | ICD-10-CM | POA: Diagnosis not present

## 2018-02-17 DIAGNOSIS — G629 Polyneuropathy, unspecified: Secondary | ICD-10-CM | POA: Diagnosis not present

## 2018-02-17 DIAGNOSIS — N400 Enlarged prostate without lower urinary tract symptoms: Secondary | ICD-10-CM | POA: Diagnosis not present

## 2018-02-17 DIAGNOSIS — K219 Gastro-esophageal reflux disease without esophagitis: Secondary | ICD-10-CM | POA: Diagnosis not present

## 2018-02-17 DIAGNOSIS — Z0000 Encounter for general adult medical examination without abnormal findings: Secondary | ICD-10-CM | POA: Diagnosis not present

## 2018-03-02 DIAGNOSIS — J438 Other emphysema: Secondary | ICD-10-CM | POA: Diagnosis not present

## 2018-03-02 DIAGNOSIS — J849 Interstitial pulmonary disease, unspecified: Secondary | ICD-10-CM | POA: Diagnosis not present

## 2018-03-02 DIAGNOSIS — J432 Centrilobular emphysema: Secondary | ICD-10-CM | POA: Diagnosis not present

## 2018-03-02 DIAGNOSIS — J449 Chronic obstructive pulmonary disease, unspecified: Secondary | ICD-10-CM | POA: Diagnosis not present

## 2018-03-04 DIAGNOSIS — L03116 Cellulitis of left lower limb: Secondary | ICD-10-CM | POA: Diagnosis not present

## 2018-03-04 DIAGNOSIS — R739 Hyperglycemia, unspecified: Secondary | ICD-10-CM | POA: Diagnosis not present

## 2018-04-02 DIAGNOSIS — J432 Centrilobular emphysema: Secondary | ICD-10-CM | POA: Diagnosis not present

## 2018-04-02 DIAGNOSIS — J438 Other emphysema: Secondary | ICD-10-CM | POA: Diagnosis not present

## 2018-04-02 DIAGNOSIS — J449 Chronic obstructive pulmonary disease, unspecified: Secondary | ICD-10-CM | POA: Diagnosis not present

## 2018-04-02 DIAGNOSIS — J849 Interstitial pulmonary disease, unspecified: Secondary | ICD-10-CM | POA: Diagnosis not present

## 2018-04-05 DIAGNOSIS — H401134 Primary open-angle glaucoma, bilateral, indeterminate stage: Secondary | ICD-10-CM | POA: Diagnosis not present

## 2018-05-02 DIAGNOSIS — J449 Chronic obstructive pulmonary disease, unspecified: Secondary | ICD-10-CM | POA: Diagnosis not present

## 2018-05-02 DIAGNOSIS — J438 Other emphysema: Secondary | ICD-10-CM | POA: Diagnosis not present

## 2018-05-02 DIAGNOSIS — J432 Centrilobular emphysema: Secondary | ICD-10-CM | POA: Diagnosis not present

## 2018-05-02 DIAGNOSIS — J849 Interstitial pulmonary disease, unspecified: Secondary | ICD-10-CM | POA: Diagnosis not present

## 2018-05-18 DIAGNOSIS — H903 Sensorineural hearing loss, bilateral: Secondary | ICD-10-CM | POA: Diagnosis not present

## 2018-05-31 DIAGNOSIS — J441 Chronic obstructive pulmonary disease with (acute) exacerbation: Secondary | ICD-10-CM | POA: Diagnosis not present

## 2018-06-02 DIAGNOSIS — J432 Centrilobular emphysema: Secondary | ICD-10-CM | POA: Diagnosis not present

## 2018-06-02 DIAGNOSIS — J438 Other emphysema: Secondary | ICD-10-CM | POA: Diagnosis not present

## 2018-06-02 DIAGNOSIS — J449 Chronic obstructive pulmonary disease, unspecified: Secondary | ICD-10-CM | POA: Diagnosis not present

## 2018-06-02 DIAGNOSIS — J849 Interstitial pulmonary disease, unspecified: Secondary | ICD-10-CM | POA: Diagnosis not present

## 2018-06-24 ENCOUNTER — Encounter: Payer: Self-pay | Admitting: Internal Medicine

## 2018-06-24 ENCOUNTER — Ambulatory Visit (INDEPENDENT_AMBULATORY_CARE_PROVIDER_SITE_OTHER)
Admission: RE | Admit: 2018-06-24 | Discharge: 2018-06-24 | Disposition: A | Discharge status: Home / Self Care | Payer: PPO | Source: Ambulatory Visit | Attending: Internal Medicine | Admitting: Internal Medicine

## 2018-06-24 ENCOUNTER — Ambulatory Visit: Payer: PPO | Admitting: Internal Medicine

## 2018-06-24 VITALS — BP 102/68 | HR 66 | Ht 66.0 in | Wt 138.4 lb

## 2018-06-24 DIAGNOSIS — J449 Chronic obstructive pulmonary disease, unspecified: Secondary | ICD-10-CM

## 2018-06-24 DIAGNOSIS — J441 Chronic obstructive pulmonary disease with (acute) exacerbation: Secondary | ICD-10-CM

## 2018-06-24 DIAGNOSIS — J849 Interstitial pulmonary disease, unspecified: Secondary | ICD-10-CM

## 2018-06-24 DIAGNOSIS — J9611 Chronic respiratory failure with hypoxia: Secondary | ICD-10-CM

## 2018-06-24 MED ORDER — FLUTICASONE-SALMETEROL 100-50 MCG/DOSE IN AEPB: 1.0000 | INHALATION_SPRAY | Freq: Two times a day (BID) | RESPIRATORY_TRACT | 3 refills | Status: DC

## 2018-06-24 NOTE — Progress Notes (Signed)
Patient ID: [name], male    DOB: March 01, 1927, 84 y.o.   MRN: [id number] HPI male former smoker followed for asthma/COPD, chronic hypoxic resp failure,, UIP, allergic rhinitis, complicated by GERD  ------------------------------------------------  01/07/18- 84 year old male former smoker followed for asthma/COPD, UIP, allergic rhinitis, complicated by GERD, glaucoma O2 2 L sleep [facility] ----COPD mixed type: Pt wears O2 QHS through [facility]. Pt gets SOB with exertion still. Will need ONO to recert O2 needs.  Desat to 85% on room air on arrival. Advair 100 Better with oxygen.  Moves around very little at home and does not feel as much need for oxygen there.  06/24/2018- 84 year old male former smoker followed for asthma/COPD, UIP, allergic rhinitis, complicated by GERD, glaucoma O2 2 L sleep [facility] Advair 250 He was seen at [facility] 6/24 for URI.  They recognized his UIP and he again stated he did not want to CT scan or the medications available to slow  this condition down.  This is the choice he has made in discussion with me previously.  Treated there for COPD exac with avelox, pred taper.  Does not see well and does not hear well.  He prefers to stay with familiar medicines including his Advair.  ROS-see HPI  + = positive Constitutional:   No-   weight loss, night sweats, fevers, chills,  +fatigue, lassitude. HEENT:   No-  headaches, difficulty swallowing, tooth/dental problems, sore throat,       No-  sneezing, itching, ear ache, nasal congestion, post nasal drip,  CV:  No-   chest pain, orthopnea, PND, swelling in lower extremities, anasarca, dizziness, palpitations Resp: + shortness of breath with exertion or at rest.              No- productive cough,   +non-productive cough,  No- coughing up of blood.              No-   change in color of mucus.  No- wheezing.   Skin: No-   rash or lesions. GI:  No-   heartburn, indigestion, abdominal pain, nausea, vomiting,  GU: MS:  No-    joint pain or swelling.   Neuro-     nothing unusual Psych:  No- change in mood or affect. No depression or anxiety.  ? memory loss.  Objective:   OBJ- Physical Exam General- Alert, Oriented, Affect-appropriate, Distress- none acute, room air Skin- Clear Lymphadenopathy- none Head- atraumatic            Eyes-   conjunctivae and secretions clear            Ears- +HOH-bilateral hearing aids            Nose- Clear, no-Septal dev, mucus, polyps, erosion, perforation             Throat- Mallampati II , mucosa clear , drainage- none, tonsils- atrophic Neck- flexible , trachea midline, no stridor , thyroid nl, carotid no bruit Chest - symmetrical excursion , unlabored           Heart/CV- +RRR with extra beats , 2/6+ murmur , no gallop  , no rub, nl s1 s2                           - JVD -none, edema- none, stasis changes- none, varices- none           Lung-  +unlabored on room air , wheeze-none, cough-none ,  dullness-none, rub- none, + coarse crackles in bases           Chest wall-  Abd-  Br/ Gen/ Rectal- Not done, not indicated Extrem- cyanosis- none, clubbing- none, atrophy- none, strength- nl Neuro- grossly intact to observation

## 2018-06-24 NOTE — Patient Instructions (Addendum)
Order CXR   Dx UIP, COPD mixed type  Script sent refilling Advair  Please call if we can help

## 2018-07-02 DIAGNOSIS — J449 Chronic obstructive pulmonary disease, unspecified: Secondary | ICD-10-CM | POA: Diagnosis not present

## 2018-07-02 DIAGNOSIS — J849 Interstitial pulmonary disease, unspecified: Secondary | ICD-10-CM | POA: Diagnosis not present

## 2018-07-02 DIAGNOSIS — J432 Centrilobular emphysema: Secondary | ICD-10-CM | POA: Diagnosis not present

## 2018-07-02 DIAGNOSIS — J438 Other emphysema: Secondary | ICD-10-CM | POA: Diagnosis not present

## 2018-07-13 NOTE — Assessment & Plan Note (Signed)
He may have a very mild acute bronchitis, residual from his treatment at urgent care. Plan-refill Advair, CXR

## 2018-07-13 NOTE — Assessment & Plan Note (Signed)
Probably UIP.  He is family are aware that he chooses not to treat.  At age 84 this is entirely appropriate.

## 2018-07-13 NOTE — Assessment & Plan Note (Addendum)
He uses and benefits from oxygen mainly for sleep and occasionally during the day if needed.  No change.

## 2018-08-02 DIAGNOSIS — J438 Other emphysema: Secondary | ICD-10-CM | POA: Diagnosis not present

## 2018-08-02 DIAGNOSIS — J849 Interstitial pulmonary disease, unspecified: Secondary | ICD-10-CM | POA: Diagnosis not present

## 2018-08-02 DIAGNOSIS — J432 Centrilobular emphysema: Secondary | ICD-10-CM | POA: Diagnosis not present

## 2018-08-02 DIAGNOSIS — J449 Chronic obstructive pulmonary disease, unspecified: Secondary | ICD-10-CM | POA: Diagnosis not present

## 2018-08-18 DIAGNOSIS — Z85828 Personal history of other malignant neoplasm of skin: Secondary | ICD-10-CM | POA: Diagnosis not present

## 2018-08-18 DIAGNOSIS — Z87891 Personal history of nicotine dependence: Secondary | ICD-10-CM | POA: Diagnosis not present

## 2018-08-18 DIAGNOSIS — Z872 Personal history of diseases of the skin and subcutaneous tissue: Secondary | ICD-10-CM | POA: Diagnosis not present

## 2018-08-18 DIAGNOSIS — L814 Other melanin hyperpigmentation: Secondary | ICD-10-CM | POA: Diagnosis not present

## 2018-08-18 DIAGNOSIS — D229 Melanocytic nevi, unspecified: Secondary | ICD-10-CM | POA: Diagnosis not present

## 2018-08-18 DIAGNOSIS — L812 Freckles: Secondary | ICD-10-CM | POA: Diagnosis not present

## 2018-08-18 DIAGNOSIS — L570 Actinic keratosis: Secondary | ICD-10-CM | POA: Diagnosis not present

## 2018-08-18 DIAGNOSIS — L578 Other skin changes due to chronic exposure to nonionizing radiation: Secondary | ICD-10-CM | POA: Diagnosis not present

## 2018-08-18 DIAGNOSIS — Z1283 Encounter for screening for malignant neoplasm of skin: Secondary | ICD-10-CM | POA: Diagnosis not present

## 2018-08-25 DIAGNOSIS — S00412A Abrasion of left ear, initial encounter: Secondary | ICD-10-CM | POA: Diagnosis not present

## 2018-08-26 DIAGNOSIS — S00412A Abrasion of left ear, initial encounter: Secondary | ICD-10-CM | POA: Diagnosis not present

## 2018-08-26 DIAGNOSIS — H6121 Impacted cerumen, right ear: Secondary | ICD-10-CM | POA: Diagnosis not present

## 2018-09-02 DIAGNOSIS — J432 Centrilobular emphysema: Secondary | ICD-10-CM | POA: Diagnosis not present

## 2018-09-02 DIAGNOSIS — J449 Chronic obstructive pulmonary disease, unspecified: Secondary | ICD-10-CM | POA: Diagnosis not present

## 2018-09-02 DIAGNOSIS — J849 Interstitial pulmonary disease, unspecified: Secondary | ICD-10-CM | POA: Diagnosis not present

## 2018-09-02 DIAGNOSIS — J438 Other emphysema: Secondary | ICD-10-CM | POA: Diagnosis not present

## 2018-09-02 DIAGNOSIS — Z23 Encounter for immunization: Secondary | ICD-10-CM | POA: Diagnosis not present

## 2018-09-02 DIAGNOSIS — H401134 Primary open-angle glaucoma, bilateral, indeterminate stage: Secondary | ICD-10-CM | POA: Diagnosis not present

## 2018-09-22 DIAGNOSIS — N402 Nodular prostate without lower urinary tract symptoms: Secondary | ICD-10-CM | POA: Diagnosis not present

## 2018-10-02 DIAGNOSIS — J438 Other emphysema: Secondary | ICD-10-CM | POA: Diagnosis not present

## 2018-10-02 DIAGNOSIS — J432 Centrilobular emphysema: Secondary | ICD-10-CM | POA: Diagnosis not present

## 2018-10-02 DIAGNOSIS — J449 Chronic obstructive pulmonary disease, unspecified: Secondary | ICD-10-CM | POA: Diagnosis not present

## 2018-10-02 DIAGNOSIS — J849 Interstitial pulmonary disease, unspecified: Secondary | ICD-10-CM | POA: Diagnosis not present

## 2018-11-02 DIAGNOSIS — J449 Chronic obstructive pulmonary disease, unspecified: Secondary | ICD-10-CM | POA: Diagnosis not present

## 2018-11-02 DIAGNOSIS — J438 Other emphysema: Secondary | ICD-10-CM | POA: Diagnosis not present

## 2018-11-02 DIAGNOSIS — J432 Centrilobular emphysema: Secondary | ICD-10-CM | POA: Diagnosis not present

## 2018-11-02 DIAGNOSIS — J849 Interstitial pulmonary disease, unspecified: Secondary | ICD-10-CM | POA: Diagnosis not present

## 2018-11-18 DIAGNOSIS — H401134 Primary open-angle glaucoma, bilateral, indeterminate stage: Secondary | ICD-10-CM | POA: Diagnosis not present

## 2018-11-18 DIAGNOSIS — H353190 Nonexudative age-related macular degeneration, unspecified eye, stage unspecified: Secondary | ICD-10-CM | POA: Diagnosis not present

## 2018-11-18 DIAGNOSIS — Z961 Presence of intraocular lens: Secondary | ICD-10-CM | POA: Diagnosis not present

## 2018-11-24 DIAGNOSIS — H60501 Unspecified acute noninfective otitis externa, right ear: Secondary | ICD-10-CM | POA: Diagnosis not present

## 2018-12-02 DIAGNOSIS — J449 Chronic obstructive pulmonary disease, unspecified: Secondary | ICD-10-CM | POA: Diagnosis not present

## 2018-12-02 DIAGNOSIS — J432 Centrilobular emphysema: Secondary | ICD-10-CM | POA: Diagnosis not present

## 2018-12-02 DIAGNOSIS — J438 Other emphysema: Secondary | ICD-10-CM | POA: Diagnosis not present

## 2018-12-02 DIAGNOSIS — J849 Interstitial pulmonary disease, unspecified: Secondary | ICD-10-CM | POA: Diagnosis not present

## 2018-12-16 DIAGNOSIS — L814 Other melanin hyperpigmentation: Secondary | ICD-10-CM | POA: Diagnosis not present

## 2018-12-16 DIAGNOSIS — D229 Melanocytic nevi, unspecified: Secondary | ICD-10-CM | POA: Diagnosis not present

## 2018-12-16 DIAGNOSIS — Z1283 Encounter for screening for malignant neoplasm of skin: Secondary | ICD-10-CM | POA: Diagnosis not present

## 2018-12-16 DIAGNOSIS — Z85828 Personal history of other malignant neoplasm of skin: Secondary | ICD-10-CM | POA: Diagnosis not present

## 2018-12-16 DIAGNOSIS — D485 Neoplasm of uncertain behavior of skin: Secondary | ICD-10-CM | POA: Diagnosis not present

## 2018-12-16 DIAGNOSIS — C44612 Basal cell carcinoma of skin of right upper limb, including shoulder: Secondary | ICD-10-CM | POA: Diagnosis not present

## 2018-12-16 DIAGNOSIS — I781 Nevus, non-neoplastic: Secondary | ICD-10-CM | POA: Diagnosis not present

## 2018-12-16 DIAGNOSIS — L812 Freckles: Secondary | ICD-10-CM | POA: Diagnosis not present

## 2018-12-16 DIAGNOSIS — Z872 Personal history of diseases of the skin and subcutaneous tissue: Secondary | ICD-10-CM | POA: Diagnosis not present

## 2018-12-16 DIAGNOSIS — Z87891 Personal history of nicotine dependence: Secondary | ICD-10-CM | POA: Diagnosis not present

## 2018-12-16 DIAGNOSIS — L570 Actinic keratosis: Secondary | ICD-10-CM | POA: Diagnosis not present

## 2018-12-16 DIAGNOSIS — L578 Other skin changes due to chronic exposure to nonionizing radiation: Secondary | ICD-10-CM | POA: Diagnosis not present

## 2018-12-16 DIAGNOSIS — L821 Other seborrheic keratosis: Secondary | ICD-10-CM | POA: Diagnosis not present

## 2018-12-30 ENCOUNTER — Encounter: Payer: Self-pay | Admitting: Internal Medicine

## 2018-12-30 ENCOUNTER — Ambulatory Visit (INDEPENDENT_AMBULATORY_CARE_PROVIDER_SITE_OTHER)
Admission: RE | Admit: 2018-12-30 | Discharge: 2018-12-30 | Disposition: A | Discharge status: Home / Self Care | Payer: PPO | Source: Ambulatory Visit | Attending: Internal Medicine | Admitting: Internal Medicine

## 2018-12-30 ENCOUNTER — Ambulatory Visit: Payer: PPO | Admitting: Internal Medicine

## 2018-12-30 VITALS — BP 118/78 | HR 74 | Ht 66.0 in | Wt 150.0 lb

## 2018-12-30 DIAGNOSIS — J9611 Chronic respiratory failure with hypoxia: Secondary | ICD-10-CM | POA: Diagnosis not present

## 2018-12-30 DIAGNOSIS — J849 Interstitial pulmonary disease, unspecified: Secondary | ICD-10-CM

## 2018-12-30 DIAGNOSIS — J449 Chronic obstructive pulmonary disease, unspecified: Secondary | ICD-10-CM | POA: Diagnosis not present

## 2018-12-30 NOTE — Patient Instructions (Signed)
Order- CXR    Dx ILD  Please call if we can help

## 2018-12-30 NOTE — Progress Notes (Signed)
Patient ID: [name], male    DOB: [birth year], 85 y.o.   MRN: [id number] HPI male former smoker followed for asthma/COPD, chronic hypoxic resp failure,, UIP, allergic rhinitis, complicated by GERD  ------------------------------------------------  06/24/2018- 85 year old male former smoker followed for asthma/COPD, UIP, allergic rhinitis, complicated by GERD, glaucoma O2 2 L sleep Advanced Advair 250 He was seen at [facility] 6/24 for URI.  They recognized his UIP and he again stated he did not want to CT scan or the medications available to slow  this condition down.  This is the choice he has made in discussion with me previously.  Treated there for COPD exac with avelox, pred taper.  Does not see well and does not hear well.  He prefers to stay with familiar medicines including his Advair.  12/30/2018- 85 year old male former smoker followed for asthma/COPD, UIP, allergic rhinitis, complicated by GERD, glaucoma O2 2 L sleep Advanced Advair 250 -----ILD: Pt states he has SOB and wheezing with uphill or stairs. Can walk about 100 ft without giving out.  Family is with him and pleased they have avoided flu so far.  He did have flu shot.  Daughter credits his lack of acute exacerbation to probiotics.  He is satisfied with current treatment, continuing oxygen for sleep. CXR 06/24/2018- COPD with chronic interstitial lung disease without changed compared prior exam. No acute abnormality  ROS-see HPI  + = positive Constitutional:   No-   weight loss, night sweats, fevers, chills,  +fatigue, lassitude. HEENT:   No-  headaches, difficulty swallowing, tooth/dental problems, sore throat,       No-  sneezing, itching, ear ache, nasal congestion, post nasal drip,  CV:  No-   chest pain, orthopnea, PND, swelling in lower extremities, anasarca, dizziness, palpitations Resp: + shortness of breath with exertion or at rest.              No- productive cough,   +non-productive cough,  No- coughing up of  blood.              No-   change in color of mucus.  No- wheezing.   Skin: No-   rash or lesions. GI:  No-   heartburn, indigestion, abdominal pain, nausea, vomiting,  GU: MS:  No-   joint pain or swelling.   Neuro-     nothing unusual Psych:  No- change in mood or affect. No depression or anxiety.  ? memory loss.  Objective:   OBJ- Physical Exam General- Alert, Oriented, Affect-appropriate, Distress- none acute, room air Skin- Clear Lymphadenopathy- none Head- atraumatic            Eyes-   conjunctivae and secretions clear            Ears- +HOH-bilateral hearing aids            Nose- Clear, no-Septal dev, mucus, polyps, erosion, perforation             Throat- Mallampati II , mucosa clear , drainage- none, tonsils- atrophic Neck- flexible , trachea midline, no stridor , thyroid nl, carotid no bruit Chest - symmetrical excursion , unlabored           Heart/CV- +RRR with extra beats , 2/6+ murmur , no gallop  , no rub, nl s1 s2                           - JVD -none, edema- none, stasis changes-  none, varices- none           Lung-  +unlabored on room air , wheeze-none, cough-none ,                            dullness-none, rub- none, + coarse crackles in bases           Chest wall-  Abd-  Br/ Gen/ Rectal- Not done, not indicated Extrem- cyanosis- none, clubbing- none, atrophy- none, strength- nl Neuro- grossly intact to observation

## 2019-01-02 DIAGNOSIS — J449 Chronic obstructive pulmonary disease, unspecified: Secondary | ICD-10-CM | POA: Diagnosis not present

## 2019-01-02 DIAGNOSIS — J432 Centrilobular emphysema: Secondary | ICD-10-CM | POA: Diagnosis not present

## 2019-01-02 DIAGNOSIS — J849 Interstitial pulmonary disease, unspecified: Secondary | ICD-10-CM | POA: Diagnosis not present

## 2019-01-02 DIAGNOSIS — J438 Other emphysema: Secondary | ICD-10-CM | POA: Diagnosis not present

## 2019-01-02 NOTE — Assessment & Plan Note (Signed)
Neuro following conservatively.  He has not wanted medications available that might slow progression of fibrosis and clinically he does not seem to be changing.

## 2019-01-02 NOTE — Assessment & Plan Note (Signed)
Continues to need oxygen for sleep.

## 2019-01-02 NOTE — Assessment & Plan Note (Signed)
Is satisfied to continue Advair without a rescue inhaler.  Medication discussion done.

## 2019-01-26 DIAGNOSIS — C792 Secondary malignant neoplasm of skin: Secondary | ICD-10-CM | POA: Diagnosis not present

## 2019-01-26 DIAGNOSIS — C44612 Basal cell carcinoma of skin of right upper limb, including shoulder: Secondary | ICD-10-CM | POA: Diagnosis not present

## 2019-01-26 DIAGNOSIS — Z87891 Personal history of nicotine dependence: Secondary | ICD-10-CM | POA: Diagnosis not present

## 2019-01-26 DIAGNOSIS — C44629 Squamous cell carcinoma of skin of left upper limb, including shoulder: Secondary | ICD-10-CM | POA: Diagnosis not present

## 2019-02-02 DIAGNOSIS — J849 Interstitial pulmonary disease, unspecified: Secondary | ICD-10-CM | POA: Diagnosis not present

## 2019-02-02 DIAGNOSIS — J432 Centrilobular emphysema: Secondary | ICD-10-CM | POA: Diagnosis not present

## 2019-02-02 DIAGNOSIS — J449 Chronic obstructive pulmonary disease, unspecified: Secondary | ICD-10-CM | POA: Diagnosis not present

## 2019-02-02 DIAGNOSIS — J438 Other emphysema: Secondary | ICD-10-CM | POA: Diagnosis not present

## 2019-02-08 DIAGNOSIS — Z4802 Encounter for removal of sutures: Secondary | ICD-10-CM | POA: Diagnosis not present

## 2019-02-08 DIAGNOSIS — C44629 Squamous cell carcinoma of skin of left upper limb, including shoulder: Secondary | ICD-10-CM | POA: Diagnosis not present

## 2019-02-08 DIAGNOSIS — Z87891 Personal history of nicotine dependence: Secondary | ICD-10-CM | POA: Diagnosis not present

## 2019-04-03 DIAGNOSIS — J432 Centrilobular emphysema: Secondary | ICD-10-CM | POA: Diagnosis not present

## 2019-04-03 DIAGNOSIS — J449 Chronic obstructive pulmonary disease, unspecified: Secondary | ICD-10-CM | POA: Diagnosis not present

## 2019-04-03 DIAGNOSIS — J849 Interstitial pulmonary disease, unspecified: Secondary | ICD-10-CM | POA: Diagnosis not present

## 2019-04-03 DIAGNOSIS — J438 Other emphysema: Secondary | ICD-10-CM | POA: Diagnosis not present

## 2019-04-13 DIAGNOSIS — C44619 Basal cell carcinoma of skin of left upper limb, including shoulder: Secondary | ICD-10-CM | POA: Diagnosis not present

## 2019-04-13 DIAGNOSIS — C44629 Squamous cell carcinoma of skin of left upper limb, including shoulder: Secondary | ICD-10-CM | POA: Diagnosis not present

## 2019-04-19 DIAGNOSIS — Z4802 Encounter for removal of sutures: Secondary | ICD-10-CM | POA: Diagnosis not present

## 2019-04-27 DIAGNOSIS — Z4802 Encounter for removal of sutures: Secondary | ICD-10-CM | POA: Diagnosis not present

## 2019-05-03 DIAGNOSIS — J438 Other emphysema: Secondary | ICD-10-CM | POA: Diagnosis not present

## 2019-05-03 DIAGNOSIS — J449 Chronic obstructive pulmonary disease, unspecified: Secondary | ICD-10-CM | POA: Diagnosis not present

## 2019-05-03 DIAGNOSIS — J432 Centrilobular emphysema: Secondary | ICD-10-CM | POA: Diagnosis not present

## 2019-05-03 DIAGNOSIS — J849 Interstitial pulmonary disease, unspecified: Secondary | ICD-10-CM | POA: Diagnosis not present

## 2019-06-03 DIAGNOSIS — J449 Chronic obstructive pulmonary disease, unspecified: Secondary | ICD-10-CM | POA: Diagnosis not present

## 2019-06-03 DIAGNOSIS — J849 Interstitial pulmonary disease, unspecified: Secondary | ICD-10-CM | POA: Diagnosis not present

## 2019-06-03 DIAGNOSIS — J432 Centrilobular emphysema: Secondary | ICD-10-CM | POA: Diagnosis not present

## 2019-06-03 DIAGNOSIS — J438 Other emphysema: Secondary | ICD-10-CM | POA: Diagnosis not present

## 2019-06-09 DIAGNOSIS — Z87891 Personal history of nicotine dependence: Secondary | ICD-10-CM | POA: Diagnosis not present

## 2019-06-09 DIAGNOSIS — Z20828 Contact with and (suspected) exposure to other viral communicable diseases: Secondary | ICD-10-CM | POA: Diagnosis not present

## 2019-06-09 DIAGNOSIS — R05 Cough: Secondary | ICD-10-CM | POA: Diagnosis not present

## 2019-06-09 DIAGNOSIS — J441 Chronic obstructive pulmonary disease with (acute) exacerbation: Secondary | ICD-10-CM | POA: Diagnosis not present

## 2019-07-03 DIAGNOSIS — J849 Interstitial pulmonary disease, unspecified: Secondary | ICD-10-CM | POA: Diagnosis not present

## 2019-07-03 DIAGNOSIS — J449 Chronic obstructive pulmonary disease, unspecified: Secondary | ICD-10-CM | POA: Diagnosis not present

## 2019-07-03 DIAGNOSIS — J432 Centrilobular emphysema: Secondary | ICD-10-CM | POA: Diagnosis not present

## 2019-07-03 DIAGNOSIS — J438 Other emphysema: Secondary | ICD-10-CM | POA: Diagnosis not present

## 2019-07-07 ENCOUNTER — Telehealth: Payer: Self-pay | Admitting: Internal Medicine

## 2019-07-07 ENCOUNTER — Ambulatory Visit: Payer: PPO | Admitting: Internal Medicine

## 2019-07-07 MED ORDER — FLUTICASONE-SALMETEROL 100-50 MCG/DOSE IN AEPB: 1.0000 | INHALATION_SPRAY | Freq: Two times a day (BID) | RESPIRATORY_TRACT | 3 refills | Status: DC

## 2019-07-07 NOTE — Telephone Encounter (Signed)
Refill for pt's advair has been sent to preferred pharmacy. Called and spoke with pt's daughter [place] letting her know this had been done and [place] verbalized understanding. Nothing further needed.

## 2019-07-13 DIAGNOSIS — H401134 Primary open-angle glaucoma, bilateral, indeterminate stage: Secondary | ICD-10-CM | POA: Diagnosis not present

## 2019-07-21 ENCOUNTER — Telehealth: Payer: Self-pay | Admitting: Internal Medicine

## 2019-07-21 DIAGNOSIS — J849 Interstitial pulmonary disease, unspecified: Secondary | ICD-10-CM

## 2019-07-21 NOTE — Telephone Encounter (Signed)
Attempted to returned call to patient's daughter at above number and received message that 'the party you have reached cannot be reached at this time - please try your call again later."  Unable to leave a message.  Will need to try call again later. Will leave encounter open.

## 2019-07-22 NOTE — Telephone Encounter (Signed)
ATC pt, no answer. Left message for pt to call back.  

## 2019-07-22 NOTE — Telephone Encounter (Signed)
Spoke with [place], she states the pt most likely had pneumonia back in July and ever since then his oxygen levels are going under 88 and sometimes in the 70's. He only uses oxygen at night but [facility] has been trying to persuade him to use it during the day. A nurse came out to the house and checked his oxygen levels and she was very concerned. I advised her to make an appt with [facility]. There is an opening at Wednesday 8/19 at 1130. Can we place pt in that spot to see if he qualifies for oxygen during the day? Please advise.   Current Outpatient Medications on File Prior to Visit  Medication Sig Dispense Refill  . acetaminophen (TYLENOL) 325 MG tablet Take 650 mg by mouth every 6 (six) hours as needed for mild pain.     . bimatoprost (LUMIGAN) 0.01 % SOLN     . brimonidine (ALPHAGAN) 0.15 % ophthalmic solution Place 1 drop into both eyes 2 (two) times daily.    Marland Kitchen doxazosin (CARDURA) 4 MG tablet Take 2 mg by mouth daily.    . finasteride (PROSCAR) 5 MG tablet Take 2.5 mg by mouth every other day.    . Fluticasone-Salmeterol (ADVAIR) 100-50 MCG/DOSE AEPB Inhale 1 puff into the lungs 2 (two) times daily. 180 each 3  . gabapentin (NEURONTIN) 100 MG capsule Take 100 mg by mouth at bedtime.    . Multiple Vitamins-Minerals (ICAPS PO) Take 1 tablet by mouth daily.     . pantoprazole (PROTONIX) 20 MG tablet Take 20 mg by mouth every other day.    . Probiotic Product (ALIGN PO) Take by mouth. Take 1 capsule three times a week by mouth.    . vitamin E 100 UNIT capsule Take 100 Units by mouth. Take by mouth twice a day     No current facility-administered medications on file prior to visit.    Allergies  Allergen Reactions  . Escitalopram Oxalate Palpitations  . Tape Rash and Other (See Comments)    Pulls off skin

## 2019-07-22 NOTE — Telephone Encounter (Signed)
Attempted to call pt's daughter [place] but unable to reach. Left message for her to return call.

## 2019-07-23 NOTE — Telephone Encounter (Signed)
Okay to work in as requested

## 2019-07-25 NOTE — Telephone Encounter (Signed)
ATC pt daughter, [place], line went to voicemail. LMTCB x1.

## 2019-07-26 NOTE — Telephone Encounter (Signed)
Pt daughter Farooqui returned called and would like a call back

## 2019-07-26 NOTE — Telephone Encounter (Signed)
ATC pt daughter, [place]am, line went to voicemail. LMTCB x2.

## 2019-07-26 NOTE — Telephone Encounter (Signed)
Called and spoke to pt's daughter, [name]. She is requesting an appt with [facility] as pt needs daytime O2. [place] states [facility] picked up pt's O2 'a while back'. Per pt's chart there isnt an order d/c'ing pt's O2. The [facility] note and the latest order are within 30 days of one another and it reflects the need for both nocturnal and daytime O2.   Called [name] with [employer] and was advised pt should still have portable O2. She will speak with RT and get pt his O2 back. [name] is asking for an order for best fit portable O2, order placed. Called and spoke to [place] and informed her of the update. She verbalized understanding.   Appt made with CY on 08/01/2019 for a routine 6 month ROV. Pt may need to be qualified just in case. Will forward to [facility], LPN, to make aware prior to appt.   Will send to CY as FYI.

## 2019-07-27 NOTE — Telephone Encounter (Signed)
Noted  

## 2019-07-28 DIAGNOSIS — J849 Interstitial pulmonary disease, unspecified: Secondary | ICD-10-CM | POA: Diagnosis not present

## 2019-07-28 DIAGNOSIS — J449 Chronic obstructive pulmonary disease, unspecified: Secondary | ICD-10-CM | POA: Diagnosis not present

## 2019-07-28 DIAGNOSIS — J432 Centrilobular emphysema: Secondary | ICD-10-CM | POA: Diagnosis not present

## 2019-07-28 DIAGNOSIS — J438 Other emphysema: Secondary | ICD-10-CM | POA: Diagnosis not present

## 2019-08-01 ENCOUNTER — Ambulatory Visit (INDEPENDENT_AMBULATORY_CARE_PROVIDER_SITE_OTHER): Payer: PPO

## 2019-08-01 ENCOUNTER — Encounter: Payer: Self-pay | Admitting: Internal Medicine

## 2019-08-01 ENCOUNTER — Other Ambulatory Visit: Payer: Self-pay

## 2019-08-01 ENCOUNTER — Ambulatory Visit: Payer: PPO | Admitting: Internal Medicine

## 2019-08-01 VITALS — BP 122/86 | HR 83 | Temp 98.7°F | Ht 66.0 in | Wt 139.8 lb

## 2019-08-01 DIAGNOSIS — J441 Chronic obstructive pulmonary disease with (acute) exacerbation: Secondary | ICD-10-CM

## 2019-08-01 DIAGNOSIS — J849 Interstitial pulmonary disease, unspecified: Secondary | ICD-10-CM

## 2019-08-01 DIAGNOSIS — Z23 Encounter for immunization: Secondary | ICD-10-CM

## 2019-08-01 DIAGNOSIS — J9611 Chronic respiratory failure with hypoxia: Secondary | ICD-10-CM

## 2019-08-01 NOTE — Progress Notes (Signed)
Patient ID: [name], male    DOB: 02-Apr-1927, 85 y.o.   MRN: [id number] HPI male former smoker followed for asthma/COPD, chronic hypoxic resp failure,, UIP, allergic rhinitis, complicated by GERD Walk test 08/01/2019-  pt dropped 86% on O2 2L HR 110, resolved to 90% on 3L O2, HR 83 ------------------------------------------------   12/30/2018- 85 year old male former smoker followed for asthma/COPD, UIP, allergic rhinitis, complicated by GERD, glaucoma O2 2 L sleep Advanced Advair 250 -----ILD: Pt states he has SOB and wheezing with uphill or stairs. Can walk about 100 ft without giving out.  Family is with him and pleased they have avoided flu so far.  He did have flu shot.  Daughter credits his lack of acute exacerbation to probiotics.  He is satisfied with current treatment, continuing oxygen for sleep. CXR 06/24/2018- COPD with chronic interstitial lung disease without changed compared prior exam. No acute abnormality  08/01/2019-  85 year old male former smoker followed for asthma/COPD, UIP, allergic rhinitis, complicated by GERD, glaucoma O2 2 L sleep Advanced Advair 250 -----f/u for O2 qualifying walk, pt dropped 86% on O2 2L, resolved to 90% on 3L O2 Walk test 08/01/2019-  pt dropped 86% on O2 2L HR 110, resolved to 90% on 3L O2, HR 83 Daughter here. [employer] took portable O2 until this visit- now requalifies.  PCP Rx'd abx for presumptive pneumonia early July. Covid tested neg. Still feels weak.  CXR 12/30/2018- IMPRESSION: No acute findings. Moderate chronic stable interstitial disease.  ROS-see HPI  + = positive Constitutional:   No-   weight loss, night sweats, fevers, chills,  +fatigue, lassitude. HEENT:   No-  headaches, difficulty swallowing, tooth/dental problems, sore throat,       No-  sneezing, itching, ear ache, nasal congestion, post nasal drip,  CV:  No-   chest pain, orthopnea, PND, swelling in lower extremities, anasarca, dizziness, palpitations Resp: +  shortness of breath with exertion or at rest.              No- productive cough,   +non-productive cough,  No- coughing up of blood.              No-   change in color of mucus.  No- wheezing.   Skin: No-   rash or lesions. GI:  No-   heartburn, indigestion, abdominal pain, nausea, vomiting,  GU: MS:  No-   joint pain or swelling.   Neuro-     nothing unusual Psych:  No- change in mood or affect. No depression or anxiety.  ? memory loss.  Objective:   OBJ- Physical Exam General- Alert, Oriented, Affect-appropriate, Distress- none acute, room air Skin- Clear Lymphadenopathy- none Head- atraumatic            Eyes-   conjunctivae and secretions clear            Ears- +HOH-bilateral hearing aids            Nose- Clear, no-Septal dev, mucus, polyps, erosion, perforation             Throat- Mallampati II , mucosa clear , drainage- none, tonsils- atrophic Neck- flexible , trachea midline, no stridor , thyroid nl, carotid no bruit Chest - symmetrical excursion , unlabored           Heart/CV- +RRR with extra beats , 2/6+ murmur , no gallop  , no rub, nl s1 s2                           -  JVD -none, edema- none, stasis changes- none, varices- none           Lung-  +unlabored on room air , wheeze-none, cough-none ,                            dullness-none, rub- none, + coarse crackles in bases           Chest wall-  Abd-  Br/ Gen/ Rectal- Not done, not indicated Extrem- cyanosis- none, clubbing- none, atrophy- none, strength- nl Neuro- grossly intact to observation

## 2019-08-01 NOTE — Patient Instructions (Signed)
Order- DME [facility]- add portable O2 2-3L/ min for exertion based on walk test today.     Dx chronic respiratory failure with hypoxia  Order- Flu vax senior   We can continue current meds.

## 2019-08-03 DIAGNOSIS — J438 Other emphysema: Secondary | ICD-10-CM | POA: Diagnosis not present

## 2019-08-03 DIAGNOSIS — J449 Chronic obstructive pulmonary disease, unspecified: Secondary | ICD-10-CM | POA: Diagnosis not present

## 2019-08-03 DIAGNOSIS — J849 Interstitial pulmonary disease, unspecified: Secondary | ICD-10-CM | POA: Diagnosis not present

## 2019-08-03 DIAGNOSIS — J432 Centrilobular emphysema: Secondary | ICD-10-CM | POA: Diagnosis not present

## 2019-08-09 DIAGNOSIS — R739 Hyperglycemia, unspecified: Secondary | ICD-10-CM | POA: Diagnosis not present

## 2019-08-11 DIAGNOSIS — H6123 Impacted cerumen, bilateral: Secondary | ICD-10-CM | POA: Diagnosis not present

## 2019-08-11 DIAGNOSIS — K219 Gastro-esophageal reflux disease without esophagitis: Secondary | ICD-10-CM | POA: Diagnosis not present

## 2019-08-11 DIAGNOSIS — Z0000 Encounter for general adult medical examination without abnormal findings: Secondary | ICD-10-CM | POA: Diagnosis not present

## 2019-08-11 DIAGNOSIS — N400 Enlarged prostate without lower urinary tract symptoms: Secondary | ICD-10-CM | POA: Diagnosis not present

## 2019-08-11 DIAGNOSIS — G629 Polyneuropathy, unspecified: Secondary | ICD-10-CM | POA: Diagnosis not present

## 2019-08-22 NOTE — Assessment & Plan Note (Signed)
Re-qualified for portable O2 2-3L/ [facility]

## 2019-08-22 NOTE — Assessment & Plan Note (Signed)
Pneumonia treated in July Plan- Flu vax, CXR update

## 2019-08-28 DIAGNOSIS — J849 Interstitial pulmonary disease, unspecified: Secondary | ICD-10-CM | POA: Diagnosis not present

## 2019-08-28 DIAGNOSIS — J438 Other emphysema: Secondary | ICD-10-CM | POA: Diagnosis not present

## 2019-08-28 DIAGNOSIS — J449 Chronic obstructive pulmonary disease, unspecified: Secondary | ICD-10-CM | POA: Diagnosis not present

## 2019-08-28 DIAGNOSIS — J432 Centrilobular emphysema: Secondary | ICD-10-CM | POA: Diagnosis not present

## 2019-09-03 DIAGNOSIS — J849 Interstitial pulmonary disease, unspecified: Secondary | ICD-10-CM | POA: Diagnosis not present

## 2019-09-03 DIAGNOSIS — J432 Centrilobular emphysema: Secondary | ICD-10-CM | POA: Diagnosis not present

## 2019-09-03 DIAGNOSIS — J449 Chronic obstructive pulmonary disease, unspecified: Secondary | ICD-10-CM | POA: Diagnosis not present

## 2019-09-03 DIAGNOSIS — J438 Other emphysema: Secondary | ICD-10-CM | POA: Diagnosis not present

## 2019-09-27 DIAGNOSIS — J449 Chronic obstructive pulmonary disease, unspecified: Secondary | ICD-10-CM | POA: Diagnosis not present

## 2019-09-27 DIAGNOSIS — J849 Interstitial pulmonary disease, unspecified: Secondary | ICD-10-CM | POA: Diagnosis not present

## 2019-09-27 DIAGNOSIS — J438 Other emphysema: Secondary | ICD-10-CM | POA: Diagnosis not present

## 2019-09-27 DIAGNOSIS — J432 Centrilobular emphysema: Secondary | ICD-10-CM | POA: Diagnosis not present

## 2019-10-03 DIAGNOSIS — J449 Chronic obstructive pulmonary disease, unspecified: Secondary | ICD-10-CM | POA: Diagnosis not present

## 2019-10-03 DIAGNOSIS — J438 Other emphysema: Secondary | ICD-10-CM | POA: Diagnosis not present

## 2019-10-03 DIAGNOSIS — J849 Interstitial pulmonary disease, unspecified: Secondary | ICD-10-CM | POA: Diagnosis not present

## 2019-10-03 DIAGNOSIS — J432 Centrilobular emphysema: Secondary | ICD-10-CM | POA: Diagnosis not present

## 2019-10-06 ENCOUNTER — Ambulatory Visit: Payer: PPO | Admitting: Internal Medicine

## 2019-10-13 DIAGNOSIS — J449 Chronic obstructive pulmonary disease, unspecified: Secondary | ICD-10-CM | POA: Diagnosis not present

## 2019-10-13 DIAGNOSIS — J432 Centrilobular emphysema: Secondary | ICD-10-CM | POA: Diagnosis not present

## 2019-10-13 DIAGNOSIS — J438 Other emphysema: Secondary | ICD-10-CM | POA: Diagnosis not present

## 2019-10-13 DIAGNOSIS — J849 Interstitial pulmonary disease, unspecified: Secondary | ICD-10-CM | POA: Diagnosis not present

## 2019-11-03 DIAGNOSIS — J849 Interstitial pulmonary disease, unspecified: Secondary | ICD-10-CM | POA: Diagnosis not present

## 2019-11-03 DIAGNOSIS — J432 Centrilobular emphysema: Secondary | ICD-10-CM | POA: Diagnosis not present

## 2019-11-03 DIAGNOSIS — J438 Other emphysema: Secondary | ICD-10-CM | POA: Diagnosis not present

## 2019-11-03 DIAGNOSIS — J449 Chronic obstructive pulmonary disease, unspecified: Secondary | ICD-10-CM | POA: Diagnosis not present

## 2019-12-03 DIAGNOSIS — J849 Interstitial pulmonary disease, unspecified: Secondary | ICD-10-CM | POA: Diagnosis not present

## 2019-12-03 DIAGNOSIS — J432 Centrilobular emphysema: Secondary | ICD-10-CM | POA: Diagnosis not present

## 2019-12-03 DIAGNOSIS — J438 Other emphysema: Secondary | ICD-10-CM | POA: Diagnosis not present

## 2019-12-03 DIAGNOSIS — J449 Chronic obstructive pulmonary disease, unspecified: Secondary | ICD-10-CM | POA: Diagnosis not present

## 2019-12-15 ENCOUNTER — Encounter: Payer: Self-pay | Admitting: Internal Medicine

## 2019-12-15 ENCOUNTER — Ambulatory Visit (INDEPENDENT_AMBULATORY_CARE_PROVIDER_SITE_OTHER): Payer: PPO | Admitting: Internal Medicine

## 2019-12-15 ENCOUNTER — Other Ambulatory Visit: Payer: Self-pay

## 2019-12-15 DIAGNOSIS — J449 Chronic obstructive pulmonary disease, unspecified: Secondary | ICD-10-CM | POA: Diagnosis not present

## 2019-12-15 DIAGNOSIS — J9611 Chronic respiratory failure with hypoxia: Secondary | ICD-10-CM | POA: Diagnosis not present

## 2019-12-15 DIAGNOSIS — J849 Interstitial pulmonary disease, unspecified: Secondary | ICD-10-CM | POA: Diagnosis not present

## 2019-12-15 MED ORDER — FLUTICASONE-SALMETEROL 100-50 MCG/DOSE IN AEPB: 1.0000 | INHALATION_SPRAY | Freq: Two times a day (BID) | RESPIRATORY_TRACT | 3 refills | Status: DC

## 2019-12-15 NOTE — Patient Instructions (Signed)
We can continue the oxygen  Refill for Advair sent to [facility] mail order pharmacy  Please call if we can help

## 2019-12-15 NOTE — Progress Notes (Signed)
Patient ID: [name], male    DOB: [birth year], 86 y.o.   MRN: [id number] HPI male former smoker followed for asthma/COPD, chronic hypoxic resp failure,, UIP, allergic rhinitis, complicated by GERD Walk test 08/01/2019-  pt dropped 86% on O2 2L HR 110, resolved to 90% on 3L O2, HR 83 ------------------------------------------------   08/01/2019-  86 year old male former smoker followed for asthma/COPD, UIP, allergic rhinitis, complicated by GERD, glaucoma O2 2 L sleep Advanced Advair 250 -----f/u for O2 qualifying walk, pt dropped 86% on O2 2L, resolved to 90% on 3L O2 Walk test 08/01/2019-  pt dropped 86% on O2 2L HR 110, resolved to 90% on 3L O2, HR 83 Daughter here. [facility] took portable O2 until this visit- now requalifies.  PCP Rx'd abx for presumptive pneumonia early July. Covid tested neg. Still feels weak.  CXR 12/30/2018- IMPRESSION: No acute findings. Moderate chronic stable interstitial disease.  12/15/19- Virtual Visit via Telephone Note  I connected with [name] on 12/15/19 at  3:30 PM EST by telephone and verified that I am speaking with the correct person using two identifiers.  Location: Patient: H Provider: O   I discussed the limitations, risks, security and privacy concerns of performing an evaluation and management service by telephone and the availability of in person appointments. I also discussed with the patient that there may be a patient responsible charge related to this service. The patient expressed understanding and agreed to proceed.   History of Present Illness:  86 year old male former smoker followed for asthma/COPD, UIP, allergic rhinitis, complicated by GERD, glaucoma O2 2 L sleep Advanced Advair 250  f/u Chronic respiratory failure/ILD.SOB. O2 2l Cont.  Daughter on phone to assist with call. No problems reported. Meds remain stable and satisfactory. Continuous O2. No acute changes.   Observations/Objective: CXR 08/01/2019- Increased  reticulonodular opacity in the right mid lung, favored to represent lobar pneumonia superimposed on interstitial fibrosis, less likely progression of chronic disease.  Assessment and Plan: COPD mixed type- currently stable Chronic hypoxic resp fail- depends on O2 UIP- He has chosen not to treat this- appropriate  Follow Up Instructions: 6 months   I discussed the assessment and treatment plan with the patient. The patient was provided an opportunity to ask questions and all were answered. The patient agreed with the plan and demonstrated an understanding of the instructions.   The patient was advised to call back or seek an in-person evaluation if the symptoms worsen or if the condition fails to improve as anticipated.  I provided 18 minutes of non-face-to-face time during this encounter.   Evan Lyons, MD    ROS-see HPI  + = positive Constitutional:   No-   weight loss, night sweats, fevers, chills,  +fatigue, lassitude. HEENT:   No-  headaches, difficulty swallowing, tooth/dental problems, sore throat,       No-  sneezing, itching, ear ache, nasal congestion, post nasal drip,  CV:  No-   chest pain, orthopnea, PND, swelling in lower extremities, anasarca, dizziness, palpitations Resp: + shortness of breath with exertion or at rest.              No- productive cough,   +non-productive cough,  No- coughing up of blood.              No-   change in color of mucus.  No- wheezing.   Skin: No-   rash or lesions. GI:  No-   heartburn, indigestion, abdominal pain, nausea, vomiting,  GU: MS:  No-   joint pain or swelling.   Neuro-     nothing unusual Psych:  No- change in mood or affect. No depression or anxiety.  ? memory loss.  Objective:   OBJ- Physical Exam General- Alert, Oriented, Affect-appropriate, Distress- none acute, room air Skin- Clear Lymphadenopathy- none Head- atraumatic            Eyes-   conjunctivae and secretions clear            Ears- +HOH-bilateral  hearing aids            Nose- Clear, no-Septal dev, mucus, polyps, erosion, perforation             Throat- Mallampati II , mucosa clear , drainage- none, tonsils- atrophic Neck- flexible , trachea midline, no stridor , thyroid nl, carotid no bruit Chest - symmetrical excursion , unlabored           Heart/CV- +RRR with extra beats , 2/6+ murmur , no gallop  , no rub, nl s1 s2                           - JVD -none, edema- none, stasis changes- none, varices- none           Lung-  +unlabored on room air , wheeze-none, cough-none ,                            dullness-none, rub- none, + coarse crackles in bases           Chest wall-  Abd-  Br/ Gen/ Rectal- Not done, not indicated Extrem- cyanosis- none, clubbing- none, atrophy- none, strength- nl Neuro- grossly intact to observation

## 2019-12-17 NOTE — Assessment & Plan Note (Signed)
C/W UIP. He and family have been educated about this disease and available therapy. He has chosen to to treat. At age 86 that is very reasonable.

## 2019-12-17 NOTE — Assessment & Plan Note (Signed)
Remains dependent on O2, being used appropriately and supervised by family

## 2019-12-17 NOTE — Assessment & Plan Note (Signed)
Now stable with no recent exacerbation since pneumonia last summer Plan- refill Advair which works well for him

## 2019-12-28 DIAGNOSIS — Z808 Family history of malignant neoplasm of other organs or systems: Secondary | ICD-10-CM | POA: Diagnosis not present

## 2019-12-28 DIAGNOSIS — D1801 Hemangioma of skin and subcutaneous tissue: Secondary | ICD-10-CM | POA: Diagnosis not present

## 2019-12-28 DIAGNOSIS — C44519 Basal cell carcinoma of skin of other part of trunk: Secondary | ICD-10-CM | POA: Diagnosis not present

## 2019-12-28 DIAGNOSIS — L812 Freckles: Secondary | ICD-10-CM | POA: Diagnosis not present

## 2019-12-28 DIAGNOSIS — Z87891 Personal history of nicotine dependence: Secondary | ICD-10-CM | POA: Diagnosis not present

## 2019-12-28 DIAGNOSIS — L814 Other melanin hyperpigmentation: Secondary | ICD-10-CM | POA: Diagnosis not present

## 2019-12-28 DIAGNOSIS — C44612 Basal cell carcinoma of skin of right upper limb, including shoulder: Secondary | ICD-10-CM | POA: Diagnosis not present

## 2019-12-28 DIAGNOSIS — L821 Other seborrheic keratosis: Secondary | ICD-10-CM | POA: Diagnosis not present

## 2019-12-28 DIAGNOSIS — Z1283 Encounter for screening for malignant neoplasm of skin: Secondary | ICD-10-CM | POA: Diagnosis not present

## 2019-12-28 DIAGNOSIS — D229 Melanocytic nevi, unspecified: Secondary | ICD-10-CM | POA: Diagnosis not present

## 2019-12-28 DIAGNOSIS — L578 Other skin changes due to chronic exposure to nonionizing radiation: Secondary | ICD-10-CM | POA: Diagnosis not present

## 2019-12-28 DIAGNOSIS — Z85828 Personal history of other malignant neoplasm of skin: Secondary | ICD-10-CM | POA: Diagnosis not present

## 2019-12-28 DIAGNOSIS — L570 Actinic keratosis: Secondary | ICD-10-CM | POA: Diagnosis not present

## 2019-12-28 DIAGNOSIS — Z872 Personal history of diseases of the skin and subcutaneous tissue: Secondary | ICD-10-CM | POA: Diagnosis not present

## 2020-01-03 DIAGNOSIS — J438 Other emphysema: Secondary | ICD-10-CM | POA: Diagnosis not present

## 2020-01-03 DIAGNOSIS — J449 Chronic obstructive pulmonary disease, unspecified: Secondary | ICD-10-CM | POA: Diagnosis not present

## 2020-01-03 DIAGNOSIS — J849 Interstitial pulmonary disease, unspecified: Secondary | ICD-10-CM | POA: Diagnosis not present

## 2020-01-03 DIAGNOSIS — J432 Centrilobular emphysema: Secondary | ICD-10-CM | POA: Diagnosis not present

## 2020-01-18 DIAGNOSIS — H401134 Primary open-angle glaucoma, bilateral, indeterminate stage: Secondary | ICD-10-CM | POA: Diagnosis not present

## 2020-02-13 DIAGNOSIS — C44612 Basal cell carcinoma of skin of right upper limb, including shoulder: Secondary | ICD-10-CM | POA: Diagnosis not present

## 2020-02-13 DIAGNOSIS — C44519 Basal cell carcinoma of skin of other part of trunk: Secondary | ICD-10-CM | POA: Diagnosis not present

## 2020-02-13 DIAGNOSIS — Z85828 Personal history of other malignant neoplasm of skin: Secondary | ICD-10-CM | POA: Diagnosis not present

## 2020-02-13 DIAGNOSIS — D045 Carcinoma in situ of skin of trunk: Secondary | ICD-10-CM | POA: Diagnosis not present

## 2020-02-22 DIAGNOSIS — J849 Interstitial pulmonary disease, unspecified: Secondary | ICD-10-CM | POA: Diagnosis not present

## 2020-02-22 DIAGNOSIS — J9611 Chronic respiratory failure with hypoxia: Secondary | ICD-10-CM | POA: Diagnosis not present

## 2020-02-22 DIAGNOSIS — J431 Panlobular emphysema: Secondary | ICD-10-CM | POA: Diagnosis not present

## 2020-02-22 DIAGNOSIS — I491 Atrial premature depolarization: Secondary | ICD-10-CM | POA: Diagnosis not present

## 2020-02-22 DIAGNOSIS — I499 Cardiac arrhythmia, unspecified: Secondary | ICD-10-CM | POA: Diagnosis not present

## 2020-02-22 DIAGNOSIS — I10 Essential (primary) hypertension: Secondary | ICD-10-CM | POA: Diagnosis present

## 2020-02-24 ENCOUNTER — Other Ambulatory Visit: Payer: Self-pay | Admitting: Internal Medicine

## 2020-02-24 DIAGNOSIS — R739 Hyperglycemia, unspecified: Secondary | ICD-10-CM | POA: Diagnosis not present

## 2020-02-24 DIAGNOSIS — R748 Abnormal levels of other serum enzymes: Secondary | ICD-10-CM | POA: Diagnosis not present

## 2020-02-24 DIAGNOSIS — J9611 Chronic respiratory failure with hypoxia: Secondary | ICD-10-CM

## 2020-02-24 DIAGNOSIS — I499 Cardiac arrhythmia, unspecified: Secondary | ICD-10-CM | POA: Diagnosis not present

## 2020-02-24 DIAGNOSIS — J431 Panlobular emphysema: Secondary | ICD-10-CM | POA: Diagnosis not present

## 2020-02-26 DIAGNOSIS — I491 Atrial premature depolarization: Secondary | ICD-10-CM | POA: Diagnosis not present

## 2020-02-27 ENCOUNTER — Inpatient Hospital Stay (HOSPITAL_COMMUNITY)
Admission: EM | Admit: 2020-02-27 | Discharge: 2020-02-29 | DRG: 194 | Disposition: A | Discharge status: Home Health | Payer: PPO | Attending: Internal Medicine | Admitting: Internal Medicine

## 2020-02-27 ENCOUNTER — Emergency Department (HOSPITAL_COMMUNITY): Payer: PPO

## 2020-02-27 ENCOUNTER — Other Ambulatory Visit: Payer: Self-pay

## 2020-02-27 ENCOUNTER — Encounter (HOSPITAL_COMMUNITY): Payer: Self-pay

## 2020-02-27 DIAGNOSIS — I471 Supraventricular tachycardia: Secondary | ICD-10-CM | POA: Diagnosis not present

## 2020-02-27 DIAGNOSIS — J432 Centrilobular emphysema: Secondary | ICD-10-CM | POA: Diagnosis not present

## 2020-02-27 DIAGNOSIS — Z79899 Other long term (current) drug therapy: Secondary | ICD-10-CM | POA: Diagnosis not present

## 2020-02-27 DIAGNOSIS — E1165 Type 2 diabetes mellitus with hyperglycemia: Secondary | ICD-10-CM | POA: Diagnosis not present

## 2020-02-27 DIAGNOSIS — I493 Ventricular premature depolarization: Secondary | ICD-10-CM | POA: Diagnosis not present

## 2020-02-27 DIAGNOSIS — Y92239 Unspecified place in hospital as the place of occurrence of the external cause: Secondary | ICD-10-CM | POA: Diagnosis not present

## 2020-02-27 DIAGNOSIS — Z85828 Personal history of other malignant neoplasm of skin: Secondary | ICD-10-CM

## 2020-02-27 DIAGNOSIS — K219 Gastro-esophageal reflux disease without esophagitis: Secondary | ICD-10-CM | POA: Diagnosis not present

## 2020-02-27 DIAGNOSIS — H9190 Unspecified hearing loss, unspecified ear: Secondary | ICD-10-CM | POA: Diagnosis not present

## 2020-02-27 DIAGNOSIS — H401134 Primary open-angle glaucoma, bilateral, indeterminate stage: Secondary | ICD-10-CM | POA: Diagnosis not present

## 2020-02-27 DIAGNOSIS — R0602 Shortness of breath: Secondary | ICD-10-CM | POA: Diagnosis not present

## 2020-02-27 DIAGNOSIS — J441 Chronic obstructive pulmonary disease with (acute) exacerbation: Secondary | ICD-10-CM | POA: Diagnosis not present

## 2020-02-27 DIAGNOSIS — J189 Pneumonia, unspecified organism: Principal | ICD-10-CM | POA: Diagnosis present

## 2020-02-27 DIAGNOSIS — Z66 Do not resuscitate: Secondary | ICD-10-CM | POA: Diagnosis not present

## 2020-02-27 DIAGNOSIS — I10 Essential (primary) hypertension: Secondary | ICD-10-CM | POA: Diagnosis present

## 2020-02-27 DIAGNOSIS — J9611 Chronic respiratory failure with hypoxia: Secondary | ICD-10-CM | POA: Diagnosis not present

## 2020-02-27 DIAGNOSIS — T380X5A Adverse effect of glucocorticoids and synthetic analogues, initial encounter: Secondary | ICD-10-CM | POA: Diagnosis not present

## 2020-02-27 DIAGNOSIS — I340 Nonrheumatic mitral (valve) insufficiency: Secondary | ICD-10-CM | POA: Diagnosis not present

## 2020-02-27 DIAGNOSIS — Z91048 Other nonmedicinal substance allergy status: Secondary | ICD-10-CM | POA: Diagnosis not present

## 2020-02-27 DIAGNOSIS — Z20822 Contact with and (suspected) exposure to covid-19: Secondary | ICD-10-CM | POA: Diagnosis present

## 2020-02-27 DIAGNOSIS — Z888 Allergy status to other drugs, medicaments and biological substances status: Secondary | ICD-10-CM | POA: Diagnosis not present

## 2020-02-27 DIAGNOSIS — Z87891 Personal history of nicotine dependence: Secondary | ICD-10-CM | POA: Diagnosis not present

## 2020-02-27 DIAGNOSIS — R17 Unspecified jaundice: Secondary | ICD-10-CM | POA: Diagnosis present

## 2020-02-27 DIAGNOSIS — Z974 Presence of external hearing-aid: Secondary | ICD-10-CM | POA: Diagnosis not present

## 2020-02-27 DIAGNOSIS — Z79890 Hormone replacement therapy (postmenopausal): Secondary | ICD-10-CM

## 2020-02-27 DIAGNOSIS — Z9981 Dependence on supplemental oxygen: Secondary | ICD-10-CM | POA: Diagnosis not present

## 2020-02-27 DIAGNOSIS — N400 Enlarged prostate without lower urinary tract symptoms: Secondary | ICD-10-CM | POA: Diagnosis present

## 2020-02-27 DIAGNOSIS — I361 Nonrheumatic tricuspid (valve) insufficiency: Secondary | ICD-10-CM | POA: Diagnosis not present

## 2020-02-27 LAB — COMPREHENSIVE METABOLIC PANEL
ALT: 19 U/L (ref 0–44)
AST: 22 U/L (ref 15–41)
Albumin: 3.9 g/dL (ref 3.5–5.0)
Alkaline Phosphatase: 93 U/L (ref 38–126)
Anion gap: 9 (ref 5–15)
BUN: 14 mg/dL (ref 8–23)
CO2: 25 mmol/L (ref 22–32)
Calcium: 8.8 mg/dL — ABNORMAL LOW (ref 8.9–10.3)
Chloride: 104 mmol/L (ref 98–111)
Creatinine, Ser: 0.95 mg/dL (ref 0.61–1.24)
GFR calc Af Amer: >60 mL/min (ref >60)
GFR calc non Af Amer: >60 mL/min (ref >60)
Glucose, Bld: 139 mg/dL — ABNORMAL HIGH (ref 70–99)
Potassium: 4.2 mmol/L (ref 3.5–5.1)
Sodium: 138 mmol/L (ref 135–145)
Total Bilirubin: 1.5 mg/dL — ABNORMAL HIGH (ref 0.3–1.2)
Total Protein: 7.6 g/dL (ref 6.5–8.1)

## 2020-02-27 LAB — CBC WITH DIFFERENTIAL/PLATELET
Abs Immature Granulocytes: 0.03 10*3/uL (ref 0.00–0.07)
Basophils Absolute: 0.1 10*3/uL (ref 0.0–0.1)
Basophils Relative: 1 %
Eosinophils Absolute: 0.3 10*3/uL (ref 0.0–0.5)
Eosinophils Relative: 3 %
HCT: 49.5 % (ref 39.0–52.0)
Hemoglobin: 16.1 g/dL (ref 13.0–17.0)
Immature Granulocytes: 0 %
Lymphocytes Relative: 19 %
Lymphs Abs: 1.8 10*3/uL (ref 0.7–4.0)
MCH: 30.2 pg (ref 26.0–34.0)
MCHC: 32.5 g/dL (ref 30.0–36.0)
MCV: 92.9 fL (ref 80.0–100.0)
Monocytes Absolute: 0.6 10*3/uL (ref 0.1–1.0)
Monocytes Relative: 7 %
Neutro Abs: 6.8 10*3/uL (ref 1.7–7.7)
Neutrophils Relative %: 70 %
Platelets: 231 10*3/uL (ref 150–400)
RBC: 5.33 MIL/uL (ref 4.22–5.81)
RDW: 13.2 % (ref 11.5–15.5)
WBC: 9.6 10*3/uL (ref 4.0–10.5)
nRBC: 0 % (ref 0.0–0.2)

## 2020-02-27 LAB — TROPONIN I (HIGH SENSITIVITY): Troponin I (High Sensitivity): 10 ng/L (ref <18)

## 2020-02-27 LAB — BRAIN NATRIURETIC PEPTIDE: B Natriuretic Peptide: 84 pg/mL (ref 0.0–100.0)

## 2020-02-27 MED ORDER — METHYLPREDNISOLONE SODIUM SUCC 125 MG IJ SOLR
80.0000 mg | Freq: Once | INTRAMUSCULAR | Status: AC
  Administered 2020-02-27: 80 mg via INTRAVENOUS
  Filled 2020-02-27: qty 2

## 2020-02-27 MED ORDER — IOHEXOL 350 MG/ML SOLN
100.0000 mL | Freq: Once | INTRAVENOUS | Status: AC | PRN
  Administered 2020-02-27: 100 mL via INTRAVENOUS

## 2020-02-27 MED ORDER — ALBUTEROL SULFATE HFA 108 (90 BASE) MCG/ACT IN AERS
4.0000 | INHALATION_SPRAY | Freq: Once | RESPIRATORY_TRACT | Status: AC
  Administered 2020-02-27: 4 via RESPIRATORY_TRACT
  Filled 2020-02-27: qty 6.7

## 2020-02-27 NOTE — ED Provider Notes (Signed)
[facility] EMERGENCY DEPARTMENT Provider Note   CSN: [id number] Arrival date & time: 02/27/20  2027     History Chief Complaint  Patient presents with  . Shortness of Breath    [name] is a 86 y.o. male.  Patient is a 86 year old male with history of COPD, interstitial lung disease on home oxygen.  He presents today with complaints of shortness of breath.  This has been worsening over the past several weeks.  His oxygen saturations at home have been dipping into the 70s and he describes shortness of breath with minimal exertion.  He denies any chest pain, fevers, chills, or productive cough.  The history is provided by the patient.  Shortness of Breath Severity:  Moderate Onset quality:  Gradual Duration:  3 weeks Timing:  Constant Progression:  Worsening Chronicity:  New Context: activity   Context: not URI   Relieved by:  Nothing Worsened by:  Exertion Ineffective treatments:  None tried Associated symptoms: no fever and no sputum production        Past Medical History:  Diagnosis Date  . Allergic rhinitis, cause unspecified   . Asthma   . BPH (benign prostatic hyperplasia)   . Chronic airway obstruction, not elsewhere classified   . Esophageal reflux   . Macular degeneration     Patient Active Problem List   Diagnosis Date Noted  . Interstitial lung disease ([place]) 12/19/2014  . COPD exacerbation ([place]) 03/13/2014  . Chronic respiratory failure with hypoxia ([place]) 03/13/2014  . Hyperglycemia 03/13/2014  . CAP (community acquired pneumonia) 03/12/2014  . Lung nodule 04/09/2011  . ALLERGIC RHINITIS 01/16/2008  . COPD mixed type ([place]) 01/16/2008  . HIATAL HERNIA, HX OF 01/13/2008  . Esophageal reflux 01/12/2008    Past Surgical History:  Procedure Laterality Date  . TURP VAPORIZATION    . VASECTOMY         Family History  Problem Relation Age of Onset  . Cancer Other        both parents    Social History   Tobacco Use  . Smoking status:  Former Smoker    Packs/day: 2.00    Years: 50.00    Pack years: 100.00    Types: Cigarettes    Quit date: 12/09/1991    Years since quitting: 28.2  . Smokeless tobacco: Never Used  Substance Use Topics  . Alcohol use: Yes    Comment: occasional glass of red wine  . Drug use: Never    Home Medications Prior to Admission medications   Medication Sig Start Date End Date Taking? Authorizing Provider  acetaminophen (TYLENOL) 325 MG tablet Take 650 mg by mouth every 6 (six) hours as needed for mild pain.     [provider]  ascorbic acid (VITAMIN C) 500 MG tablet Take by mouth.    [provider]  bimatoprost (LUMIGAN) 0.01 % SOLN  12/20/14   [provider]  brimonidine (ALPHAGAN) 0.15 % ophthalmic solution Place 1 drop into both eyes 2 (two) times daily.    [provider]  dorzolamide (TRUSOPT) 2 % ophthalmic solution  11/24/19   [provider]  doxazosin (CARDURA) 4 MG tablet Take 2 mg by mouth daily. 04/16/17   [provider]  finasteride (PROSCAR) 5 MG tablet Take 2.5 mg by mouth daily.     [provider]  Fluticasone-Salmeterol (ADVAIR) 100-50 MCG/DOSE AEPB Inhale 1 puff into the lungs 2 (two) times daily. 12/15/19   Deneise Lever, MD  gabapentin (NEURONTIN) 100 MG capsule Take 100 mg by mouth at bedtime.    [provider]  latanoprost (XALATAN) 0.005 % ophthalmic solution  07/07/19   [provider]  MELATONIN PO Take by mouth. prn    [provider]  Multiple Vitamins-Minerals (ICAPS PO) Take 1 tablet by mouth daily.     [provider]  pantoprazole (PROTONIX) 20 MG tablet Take 20 mg by mouth every other day.    [provider]  Probiotic Product (ALIGN PO) Take by mouth. Take 1 capsule three times a week by mouth.    [provider]  vitamin E 100 UNIT capsule Take 100 Units by mouth. Take by mouth twice a day    [provider]    Allergies      Escitalopram oxalate and Tape  Review of Systems   Review of Systems  Constitutional: Negative for fever.  Respiratory: Positive for shortness of breath. Negative for sputum production.   All other systems reviewed and are negative.   Physical Exam Updated Vital Signs BP (!) 173/82 (BP Location: Right Arm)   Pulse (!) 58   Temp (!) 97.5 F (36.4 C) (Oral)   Ht 5\' 4"  (1.626 m)   Wt 65.3 kg   SpO2 90%   BMI 24.72 kg/m   Physical Exam Vitals and nursing note reviewed.  Constitutional:      General: He is not in acute distress.    Appearance: He is well-developed. He is not diaphoretic.  HENT:     Head: Normocephalic and atraumatic.  Cardiovascular:     Rate and Rhythm: Normal rate and regular rhythm.     Heart sounds: No murmur. No friction rub.  Pulmonary:     Effort: Accessory muscle usage and respiratory distress present.     Breath sounds: Examination of the right-middle field reveals rhonchi. Examination of the left-middle field reveals rhonchi. Rhonchi present. No wheezing or rales.     Comments: Patient is in mild respiratory distress. Abdominal:     General: Bowel sounds are normal. There is no distension.     Palpations: Abdomen is soft.     Tenderness: There is no abdominal tenderness.  Musculoskeletal:        General: Normal range of motion.     Cervical back: Normal range of motion and neck supple.     Right lower leg: No tenderness. No edema.     Left lower leg: No tenderness. No edema.  Skin:    General: Skin is warm and dry.  Neurological:     Mental Status: He is alert and oriented to person, place, and time.     Coordination: Coordination normal.     ED Results / Procedures / Treatments   Labs (all labs ordered are listed, but only abnormal results are displayed) Labs Reviewed  COMPREHENSIVE METABOLIC PANEL  CBC WITH DIFFERENTIAL/PLATELET  BRAIN NATRIURETIC PEPTIDE  TROPONIN I (HIGH SENSITIVITY)    EKG EKG  Interpretation  Date/Time:  Monday February 27 2020 20:46:29 EDT Ventricular Rate:  106 PR Interval:  164 QRS Duration: 72 QT Interval:  324 QTC Calculation: 430 R Axis:   14 Text Interpretation: Sinus tachycardia with occasional Premature ventricular complexes Otherwise normal ECG Confirmed by Veryl Speak [phone]) on 02/27/2020 8:53:07 PM   Radiology No results found.  Procedures Procedures (including critical care time)  Medications Ordered in ED Medications  methylPREDNISolone sodium succinate (SOLU-MEDROL) 125 mg/2 mL injection 80 mg (has no administration in time  range)  albuterol (VENTOLIN HFA) 108 (90 Base) MCG/ACT inhaler 4 puff (has no administration in time range)    ED Course  I have reviewed the triage vital signs and the nursing notes.  Pertinent labs & imaging results that were available during my care of the patient were reviewed by me and considered in my medical decision making (see chart for details).    MDM Rules/Calculators/A&P  Patient is a 86 year old male presenting with complaints of shortness of breath.  Patient has interstitial lung disease and is on home oxygen.  His breathing has worsened over the past several days to the point he can walk only a few steps without having to stop and rest.  Patient's work-up thus far reveals extensive bilateral scarring and fibrosis, but no definitive airway disease.  Due to the patient's level of hypoxia and rather rapid onset, a PE study will be obtained.  Care to be signed out to Dr. Betsey Holiday at shift change.  He will obtain the results of the CT scan and determine the final disposition.  Patient given albuterol along with steroids.  Final Clinical Impression(s) / ED Diagnoses Final diagnoses:  None    Rx / DC Orders ED Discharge Orders    None       Veryl Speak, MD 02/28/20 1458

## 2020-02-27 NOTE — ED Triage Notes (Signed)
Pt to er with son, states that they are here because his heart rate was in the 40s and O2 sat was in the 70s, denies chest pain, states that he was just out of breath.  States that after supper he felt like he was going to pass out.

## 2020-02-28 ENCOUNTER — Encounter (HOSPITAL_COMMUNITY): Payer: Self-pay | Admitting: Internal Medicine

## 2020-02-28 DIAGNOSIS — I10 Essential (primary) hypertension: Secondary | ICD-10-CM | POA: Diagnosis present

## 2020-02-28 DIAGNOSIS — K219 Gastro-esophageal reflux disease without esophagitis: Secondary | ICD-10-CM | POA: Diagnosis present

## 2020-02-28 DIAGNOSIS — N400 Enlarged prostate without lower urinary tract symptoms: Secondary | ICD-10-CM | POA: Diagnosis present

## 2020-02-28 DIAGNOSIS — J441 Chronic obstructive pulmonary disease with (acute) exacerbation: Secondary | ICD-10-CM

## 2020-02-28 DIAGNOSIS — I493 Ventricular premature depolarization: Secondary | ICD-10-CM | POA: Diagnosis present

## 2020-02-28 DIAGNOSIS — I340 Nonrheumatic mitral (valve) insufficiency: Secondary | ICD-10-CM | POA: Diagnosis not present

## 2020-02-28 DIAGNOSIS — H9190 Unspecified hearing loss, unspecified ear: Secondary | ICD-10-CM | POA: Diagnosis present

## 2020-02-28 DIAGNOSIS — J189 Pneumonia, unspecified organism: Secondary | ICD-10-CM | POA: Diagnosis present

## 2020-02-28 DIAGNOSIS — Y92239 Unspecified place in hospital as the place of occurrence of the external cause: Secondary | ICD-10-CM | POA: Diagnosis not present

## 2020-02-28 DIAGNOSIS — E1165 Type 2 diabetes mellitus with hyperglycemia: Secondary | ICD-10-CM | POA: Diagnosis not present

## 2020-02-28 DIAGNOSIS — Z974 Presence of external hearing-aid: Secondary | ICD-10-CM | POA: Diagnosis not present

## 2020-02-28 DIAGNOSIS — Z87891 Personal history of nicotine dependence: Secondary | ICD-10-CM | POA: Diagnosis not present

## 2020-02-28 DIAGNOSIS — Z9981 Dependence on supplemental oxygen: Secondary | ICD-10-CM | POA: Diagnosis not present

## 2020-02-28 DIAGNOSIS — J9611 Chronic respiratory failure with hypoxia: Secondary | ICD-10-CM | POA: Diagnosis present

## 2020-02-28 DIAGNOSIS — H401134 Primary open-angle glaucoma, bilateral, indeterminate stage: Secondary | ICD-10-CM | POA: Diagnosis present

## 2020-02-28 DIAGNOSIS — I361 Nonrheumatic tricuspid (valve) insufficiency: Secondary | ICD-10-CM | POA: Diagnosis not present

## 2020-02-28 DIAGNOSIS — Z79890 Hormone replacement therapy (postmenopausal): Secondary | ICD-10-CM | POA: Diagnosis not present

## 2020-02-28 DIAGNOSIS — J432 Centrilobular emphysema: Secondary | ICD-10-CM | POA: Diagnosis present

## 2020-02-28 DIAGNOSIS — I471 Supraventricular tachycardia: Secondary | ICD-10-CM | POA: Diagnosis present

## 2020-02-28 DIAGNOSIS — Z85828 Personal history of other malignant neoplasm of skin: Secondary | ICD-10-CM | POA: Diagnosis not present

## 2020-02-28 DIAGNOSIS — Z79899 Other long term (current) drug therapy: Secondary | ICD-10-CM | POA: Diagnosis not present

## 2020-02-28 DIAGNOSIS — Z888 Allergy status to other drugs, medicaments and biological substances status: Secondary | ICD-10-CM | POA: Diagnosis not present

## 2020-02-28 DIAGNOSIS — R17 Unspecified jaundice: Secondary | ICD-10-CM | POA: Diagnosis present

## 2020-02-28 DIAGNOSIS — T380X5A Adverse effect of glucocorticoids and synthetic analogues, initial encounter: Secondary | ICD-10-CM | POA: Diagnosis not present

## 2020-02-28 DIAGNOSIS — Z66 Do not resuscitate: Secondary | ICD-10-CM | POA: Diagnosis present

## 2020-02-28 DIAGNOSIS — Z20822 Contact with and (suspected) exposure to covid-19: Secondary | ICD-10-CM | POA: Diagnosis present

## 2020-02-28 DIAGNOSIS — Z91048 Other nonmedicinal substance allergy status: Secondary | ICD-10-CM | POA: Diagnosis not present

## 2020-02-28 LAB — COMPREHENSIVE METABOLIC PANEL
ALT: 19 U/L (ref 0–44)
AST: 21 U/L (ref 15–41)
Albumin: 3.5 g/dL (ref 3.5–5.0)
Alkaline Phosphatase: 91 U/L (ref 38–126)
Anion gap: 10 (ref 5–15)
BUN: 13 mg/dL (ref 8–23)
CO2: 23 mmol/L (ref 22–32)
Calcium: 8.6 mg/dL — ABNORMAL LOW (ref 8.9–10.3)
Chloride: 105 mmol/L (ref 98–111)
Creatinine, Ser: 0.82 mg/dL (ref 0.61–1.24)
GFR calc Af Amer: >60 mL/min (ref >60)
GFR calc non Af Amer: >60 mL/min (ref >60)
Glucose, Bld: 203 mg/dL — ABNORMAL HIGH (ref 70–99)
Potassium: 4.2 mmol/L (ref 3.5–5.1)
Sodium: 138 mmol/L (ref 135–145)
Total Bilirubin: 1.3 mg/dL — ABNORMAL HIGH (ref 0.3–1.2)
Total Protein: 7.2 g/dL (ref 6.5–8.1)

## 2020-02-28 LAB — CBC WITH DIFFERENTIAL/PLATELET
Abs Immature Granulocytes: 0.02 10*3/uL (ref 0.00–0.07)
Basophils Absolute: 0 10*3/uL (ref 0.0–0.1)
Basophils Relative: 0 %
Eosinophils Absolute: 0 10*3/uL (ref 0.0–0.5)
Eosinophils Relative: 0 %
HCT: 48.3 % (ref 39.0–52.0)
Hemoglobin: 15.6 g/dL (ref 13.0–17.0)
Immature Granulocytes: 0 %
Lymphocytes Relative: 10 %
Lymphs Abs: 0.5 10*3/uL — ABNORMAL LOW (ref 0.7–4.0)
MCH: 30 pg (ref 26.0–34.0)
MCHC: 32.3 g/dL (ref 30.0–36.0)
MCV: 92.9 fL (ref 80.0–100.0)
Monocytes Absolute: 0 10*3/uL — ABNORMAL LOW (ref 0.1–1.0)
Monocytes Relative: 1 %
Neutro Abs: 4.4 10*3/uL (ref 1.7–7.7)
Neutrophils Relative %: 89 %
Platelets: 228 10*3/uL (ref 150–400)
RBC: 5.2 MIL/uL (ref 4.22–5.81)
RDW: 13.2 % (ref 11.5–15.5)
WBC: 5 10*3/uL (ref 4.0–10.5)
nRBC: 0 % (ref 0.0–0.2)

## 2020-02-28 LAB — GLUCOSE, CAPILLARY
Glucose-Capillary: 203 mg/dL — ABNORMAL HIGH (ref 70–99)
Glucose-Capillary: 148 mg/dL — ABNORMAL HIGH (ref 70–99)
Glucose-Capillary: 175 mg/dL — ABNORMAL HIGH (ref 70–99)
Glucose-Capillary: 279 mg/dL — ABNORMAL HIGH (ref 70–99)

## 2020-02-28 LAB — HEMOGLOBIN A1C
Hgb A1c MFr Bld: 6.8 % — ABNORMAL HIGH (ref 4.8–5.6)
Mean Plasma Glucose: 148.46 mg/dL

## 2020-02-28 LAB — SARS CORONAVIRUS 2 (TAT 6-24 HRS): SARS Coronavirus 2: NEGATIVE

## 2020-02-28 LAB — TROPONIN I (HIGH SENSITIVITY): Troponin I (High Sensitivity): 9 ng/L (ref <18)

## 2020-02-28 MED ORDER — ACETAMINOPHEN 650 MG RE SUPP: 650.0000 mg | Freq: Four times a day (QID) | RECTAL | Status: DC | PRN

## 2020-02-28 MED ORDER — DOXAZOSIN MESYLATE 2 MG PO TABS
2.0000 mg | ORAL_TABLET | Freq: Every day | ORAL | Status: DC
  Administered 2020-02-28 – 2020-02-29 (×2): 2 mg via ORAL
  Filled 2020-02-28 (×2): qty 1

## 2020-02-28 MED ORDER — IPRATROPIUM BROMIDE 0.02 % IN SOLN
0.5000 mg | Freq: Four times a day (QID) | RESPIRATORY_TRACT | Status: DC
  Administered 2020-02-28: 0.5 mg via RESPIRATORY_TRACT
  Filled 2020-02-28: qty 2.5

## 2020-02-28 MED ORDER — MELATONIN 3 MG PO TABS
6.0000 mg | ORAL_TABLET | Freq: Every day | ORAL | Status: DC
  Administered 2020-02-28: 6 mg via ORAL
  Filled 2020-02-28: qty 2

## 2020-02-28 MED ORDER — PREDNISONE 20 MG PO TABS
40.0000 mg | ORAL_TABLET | Freq: Every day | ORAL | Status: DC
  Filled 2020-02-28: qty 2

## 2020-02-28 MED ORDER — IPRATROPIUM BROMIDE 0.02 % IN SOLN
0.5000 mg | Freq: Three times a day (TID) | RESPIRATORY_TRACT | Status: DC
  Administered 2020-02-28 – 2020-02-29 (×2): 0.5 mg via RESPIRATORY_TRACT
  Filled 2020-02-28 (×2): qty 2.5

## 2020-02-28 MED ORDER — LEVALBUTEROL HCL 0.63 MG/3ML IN NEBU
0.6300 mg | INHALATION_SOLUTION | Freq: Three times a day (TID) | RESPIRATORY_TRACT | Status: DC
  Administered 2020-02-28 – 2020-02-29 (×2): 0.63 mg via RESPIRATORY_TRACT
  Filled 2020-02-28 (×2): qty 3

## 2020-02-28 MED ORDER — LATANOPROST 0.005 % OP SOLN
1.0000 [drp] | Freq: Every day | OPHTHALMIC | Status: DC
  Administered 2020-02-28: 1 [drp] via OPHTHALMIC
  Filled 2020-02-28: qty 2.5

## 2020-02-28 MED ORDER — ENSURE ENLIVE PO LIQD
237.0000 mL | Freq: Two times a day (BID) | ORAL | Status: DC
  Administered 2020-02-28 – 2020-02-29 (×2): 237 mL via ORAL

## 2020-02-28 MED ORDER — ACETAMINOPHEN 325 MG PO TABS
650.0000 mg | ORAL_TABLET | Freq: Four times a day (QID) | ORAL | Status: DC | PRN
  Administered 2020-02-28: 22:00:00 650 mg via ORAL
  Filled 2020-02-28: qty 2

## 2020-02-28 MED ORDER — BRIMONIDINE TARTRATE 0.2 % OP SOLN
1.0000 [drp] | Freq: Two times a day (BID) | OPHTHALMIC | Status: DC
  Administered 2020-02-28 – 2020-02-29 (×3): 1 [drp] via OPHTHALMIC
  Filled 2020-02-28 (×2): qty 5

## 2020-02-28 MED ORDER — MELATONIN 5 MG PO TABS
5.0000 mg | ORAL_TABLET | Freq: Every day | ORAL | Status: DC
  Filled 2020-02-28: qty 1

## 2020-02-28 MED ORDER — DORZOLAMIDE HCL 2 % OP SOLN
1.0000 [drp] | Freq: Three times a day (TID) | OPHTHALMIC | Status: DC
  Administered 2020-02-28 – 2020-02-29 (×4): 1 [drp] via OPHTHALMIC
  Filled 2020-02-28 (×2): qty 10

## 2020-02-28 MED ORDER — SODIUM CHLORIDE 0.9 % IV SOLN
1.0000 g | Freq: Every day | INTRAVENOUS | Status: DC
  Administered 2020-02-28 (×2): 1 g via INTRAVENOUS
  Filled 2020-02-28 (×2): qty 10

## 2020-02-28 MED ORDER — LEVALBUTEROL HCL 0.63 MG/3ML IN NEBU: 0.6300 mg | INHALATION_SOLUTION | Freq: Four times a day (QID) | RESPIRATORY_TRACT | Status: DC | PRN

## 2020-02-28 MED ORDER — INSULIN ASPART 100 UNIT/ML ~~LOC~~ SOLN
0.0000 [IU] | Freq: Three times a day (TID) | SUBCUTANEOUS | Status: DC
  Administered 2020-02-28: 5 [IU] via SUBCUTANEOUS
  Administered 2020-02-28: 3 [IU] via SUBCUTANEOUS
  Administered 2020-02-28: 2 [IU] via SUBCUTANEOUS

## 2020-02-28 MED ORDER — ENOXAPARIN SODIUM 40 MG/0.4ML ~~LOC~~ SOLN
40.0000 mg | SUBCUTANEOUS | Status: DC
  Administered 2020-02-28 (×2): 40 mg via SUBCUTANEOUS
  Filled 2020-02-28 (×2): qty 0.4

## 2020-02-28 MED ORDER — LEVALBUTEROL HCL 0.63 MG/3ML IN NEBU
0.6300 mg | INHALATION_SOLUTION | Freq: Four times a day (QID) | RESPIRATORY_TRACT | Status: DC
  Administered 2020-02-28: 0.63 mg via RESPIRATORY_TRACT
  Filled 2020-02-28: qty 3

## 2020-02-28 MED ORDER — DILTIAZEM HCL 30 MG PO TABS
30.0000 mg | ORAL_TABLET | Freq: Four times a day (QID) | ORAL | Status: DC
  Administered 2020-02-28 – 2020-02-29 (×4): 30 mg via ORAL
  Filled 2020-02-28 (×5): qty 1

## 2020-02-28 MED ORDER — GABAPENTIN 100 MG PO CAPS
100.0000 mg | ORAL_CAPSULE | Freq: Every day | ORAL | Status: DC
  Administered 2020-02-28: 100 mg via ORAL
  Filled 2020-02-28: qty 1

## 2020-02-28 MED ORDER — SODIUM CHLORIDE 0.9 % IV SOLN
500.0000 mg | Freq: Every day | INTRAVENOUS | Status: DC
  Administered 2020-02-28 (×2): 500 mg via INTRAVENOUS
  Filled 2020-02-28 (×2): qty 500

## 2020-02-28 MED ORDER — METHYLPREDNISOLONE SODIUM SUCC 40 MG IJ SOLR
40.0000 mg | Freq: Four times a day (QID) | INTRAMUSCULAR | Status: AC
  Administered 2020-02-28 (×4): 40 mg via INTRAVENOUS
  Filled 2020-02-28 (×4): qty 1

## 2020-02-28 MED ORDER — FINASTERIDE 5 MG PO TABS
2.5000 mg | ORAL_TABLET | Freq: Every day | ORAL | Status: DC
  Administered 2020-02-28 – 2020-02-29 (×2): 2.5 mg via ORAL
  Filled 2020-02-28: qty 1
  Filled 2020-02-28 (×4): qty 0.5
  Filled 2020-02-28: qty 1

## 2020-02-28 NOTE — ED Provider Notes (Signed)
Patient signed out to me by Dr. Lanny Cramp to follow-up on CT scan.  Patient seen with dyspnea and hypoxia.  CT does not show any evidence of PE but there is extensive centrilobular emphysematous changes with groundglass opacities consistent with acute inflammatory/infectious process.  Patient with increased oxygen demand, will require hospitalization.  Initiated on Rocephin and Zithromax.   Orpah Greek, MD 02/28/20 [phone]

## 2020-02-28 NOTE — Plan of Care (Signed)
  Problem: Acute Rehab PT Goals(only PT should resolve) Goal: Patient Will Transfer Sit To/From Stand Outcome: Progressing [place] (Taken 02/28/2020 1219) Patient will transfer sit to/from stand: with supervision Goal: Pt Will Transfer Bed To Chair/Chair To Bed Outcome: Progressing [place] (Taken 02/28/2020 1219) Pt will Transfer Bed to Chair/Chair to Bed: with supervision Goal: Pt Will Ambulate Outcome: Progressing Flowsheets (Taken 02/28/2020 1219) Pt will Ambulate:  75 feet  with rolling walker  with supervision   12:19 PM, 02/28/20 Lonell Grandchild, MPT Physical Therapist with [facility] [phone] office [phone] mobile phone

## 2020-02-28 NOTE — Progress Notes (Signed)
Sutures removed from right wrist and midback from prehospital visit to dermatologist.  Some redness to site on back but no drainage.

## 2020-02-28 NOTE — TOC Initial Note (Signed)
Transition of Care [facility]) - Initial/Assessment Note    Patient Details  Name: [name] MRN: [id number] Date of Birth: 01-25-1927  Transition of [facility]) CM/SW Contact:    Boneta Lucks, RN Phone Number: 02/28/2020, 2:09 PM  Clinical Narrative:    Patient admitted with pneumonia. [place]  Two son live near by and are there by 6am to check on patient. PT is recommending HHPT.  Patient has used [facility] in the past. Romualdo Bolk accepted the referral.           Expected Discharge Plan: [place] Barriers to Discharge: Continued Medical Work up   Patient Goals and CMS Choice Patient states their goals for this hospitalization and ongoing recovery are:: to go home. CMS Medicare.gov Compare Post Acute Care list provided to:: Patient Choice offered to / list presented to : Patient  Expected Discharge Plan and Services Expected Discharge Plan: [place] Choice: [place] arrangements for the past 2 months: [place]: PT [place] Agency: [employer] [facility] Date [place]: 02/28/20 Time [place]: 1200 Representative spoke with at [place]: Romualdo Bolk  Prior Living Arrangements/Services Living arrangements for the past 2 months: [place] Lives with:: Self   Do you feel safe going back to the place where you live?: Yes      Need for Family Participation in Patient Care: Yes (Comment) Care giver support system in place?: Yes (comment) Current home services: DME Criminal Activity/Legal Involvement Pertinent to Current Situation/Hospitalization: No - Comment as needed  Activities of Daily Living Home Assistive Devices/Equipment: Cane (specify quad or straight) ADL Screening (condition at time of admission) Patient's cognitive ability adequate to safely complete daily activities?: Yes Is the  patient deaf or have difficulty hearing?: Yes Does the patient have difficulty seeing, even when wearing glasses/contacts?: No Does the patient have difficulty concentrating, remembering, or making decisions?: No Does the patient have difficulty dressing or bathing?: No Independently performs ADLs?: Yes (appropriate for developmental age) Does the patient have difficulty walking or climbing stairs?: No Weakness of Legs: None Weakness of Arms/Hands: None  Permission Sought/Granted      Share Information with NAME: [place]     Permission granted to share info w Relationship: Daughter     Emotional Assessment     Affect (typically observed): Accepting   Alcohol / Substance Use: Not Applicable Psych Involvement: No (comment)  Admission diagnosis:  CAP (community acquired pneumonia) [J18.9] Patient Active Problem List   Diagnosis Date Noted  . Essential hypertension 02/22/2020  . Primary open angle glaucoma (POAG) of both eyes, indeterminate stage 03/11/2017  . Interstitial lung disease ([place]) 12/19/2014  . COPD exacerbation ([place]) 03/13/2014  . Chronic respiratory failure with hypoxia ([place]) 03/13/2014  . Hyperglycemia 03/13/2014  . Type 2 diabetes mellitus with hyperglycemia ([place]) 03/13/2014  . CAP (community acquired pneumonia) 03/12/2014  . Lung nodule 04/09/2011  . ALLERGIC RHINITIS 01/16/2008  . COPD mixed type ([place]) 01/16/2008  . HIATAL HERNIA, HX OF 01/13/2008  . Esophageal reflux 01/12/2008   PCP:  Christain Sacramento, MD Pharmacy:   [facility], [place] [address] Phone: [phone] Fax: [phone]  [facility] - [place] [place], [place] [address] Phone: [phone] Fax: [phone]

## 2020-02-28 NOTE — ED Notes (Signed)
ED TO INPATIENT HANDOFF REPORT  ED Nurse Name and Phone #:  Fabio Neighbors RN [phone]  S Name/Age/Gender [name] 86 y.o. male Room/Bed: APA03/APA03  Code Status   Code Status: Full Code  Home/SNF/Other Home Patient oriented to:  Is this baseline? Yes   Triage Complete: Triage complete  Chief Complaint CAP (community acquired pneumonia) [J18.9]  Triage Note Pt to er with son, states that they are here because his heart rate was in the 80s and O2 sat was in the 70s, denies chest pain, states that he was just out of breath.  States that after supper he felt like he was going to pass out.     Allergies Allergies  Allergen Reactions  . Escitalopram Oxalate Palpitations  . Tape Rash and Other (See Comments)    Pulls off skin    Level of Care/Admitting Diagnosis ED Disposition    ED Disposition Condition [facility] Area: [facility] [id number]  Level of Care: Telemetry [5]  Covid Evaluation: Asymptomatic Screening Protocol (No Symptoms)  Diagnosis: CAP (community acquired pneumonia) [id number]  Admitting Physician: Reubin Milan [id number]  Attending Physician: Reubin Milan [id number]  Estimated length of stay: past midnight tomorrow  Certification:: I certify this patient will need inpatient services for at least 2 midnights       B Medical/Surgery History Past Medical History:  Diagnosis Date  . Allergic rhinitis, cause unspecified   . Asthma   . BPH (benign prostatic hyperplasia)   . Chronic airway obstruction, not elsewhere classified   . Esophageal reflux   . Macular degeneration    Past Surgical History:  Procedure Laterality Date  . TURP VAPORIZATION    . VASECTOMY       A IV Location/Drains/Wounds Patient Lines/Drains/Airways Status   Active Line/Drains/Airways    Name:   Placement date:   Placement time:   Site:   Days:   Peripheral IV 02/27/20 Left Antecubital   02/27/20    2225    Antecubital   1           Intake/Output Last 24 hours No intake or output data in the 24 hours ending 02/28/20 0151  Labs/Imaging Results for orders placed or performed during the hospital encounter of 02/27/20 (from the past 48 hour(s))  Comprehensive metabolic panel     Status: Abnormal   Collection Time: 02/27/20 10:04 PM  Result Value Ref Range   Sodium 138 135 - 145 mmol/L   Potassium 4.2 3.5 - 5.1 mmol/L   Chloride 104 98 - 111 mmol/L   CO2 25 22 - 32 mmol/L   Glucose, Bld 139 (H) 70 - 99 mg/dL    Comment: Glucose reference range applies only to samples taken after fasting for at least 8 hours.   BUN 14 8 - 23 mg/dL   Creatinine, Ser 0.95 0.61 - 1.24 mg/dL   Calcium 8.8 (L) 8.9 - 10.3 mg/dL   Total Protein 7.6 6.5 - 8.1 g/dL   Albumin 3.9 3.5 - 5.0 g/dL   AST 22 15 - 41 U/L   ALT 19 0 - 44 U/L   Alkaline Phosphatase 93 38 - 126 U/L   Total Bilirubin 1.5 (H) 0.3 - 1.2 mg/dL   GFR calc non Af Amer >60 >60 mL/min   GFR calc Af Amer >60 >60 mL/min   Anion gap 9 5 - 15    Comment: Performed at [facility], [address] [place], [place] [address]  CBC with Differential     Status: None   Collection Time: 02/27/20 10:04 PM  Result Value Ref Range   WBC 9.6 4.0 - 10.5 K/uL   RBC 5.33 4.22 - 5.81 MIL/uL   Hemoglobin 16.1 13.0 - 17.0 g/dL   HCT 49.5 39.0 - 52.0 %   MCV 92.9 80.0 - 100.0 fL   MCH 30.2 26.0 - 34.0 pg   MCHC 32.5 30.0 - 36.0 g/dL   RDW 13.2 11.5 - 15.5 %   Platelets 231 150 - 400 K/uL   nRBC 0.0 0.0 - 0.2 %   Neutrophils Relative % 70 %   Neutro Abs 6.8 1.7 - 7.7 K/uL   Lymphocytes Relative 19 %   Lymphs Abs 1.8 0.7 - 4.0 K/uL   Monocytes Relative 7 %   Monocytes Absolute 0.6 0.1 - 1.0 K/uL   Eosinophils Relative 3 %   Eosinophils Absolute 0.3 0.0 - 0.5 K/uL   Basophils Relative 1 %   Basophils Absolute 0.1 0.0 - 0.1 K/uL   Immature Granulocytes 0 %   Abs Immature Granulocytes 0.03 0.00 - 0.07 K/uL    Comment: Performed at [facility], [address] [place],  [place] [address]  Troponin I (High Sensitivity)     Status: None   Collection Time: 02/27/20 10:04 PM  Result Value Ref Range   Troponin I (High Sensitivity) 10 <18 ng/L    Comment: (NOTE) Elevated high sensitivity troponin I (hsTnI) values and significant  changes across serial measurements may suggest ACS but many other  chronic and acute conditions are known to elevate hsTnI results.  Refer to the "Links" section for chest pain algorithms and additional  guidance. Performed at [facility], [address] [place], [place] [address]   Brain natriuretic peptide     Status: None   Collection Time: 02/27/20 10:04 PM  Result Value Ref Range   B Natriuretic Peptide 84.0 0.0 - 100.0 pg/mL    Comment: Performed at [facility], [address] [place], [place] [address]  Troponin I (High Sensitivity)     Status: None   Collection Time: 02/28/20 12:05 AM  Result Value Ref Range   Troponin I (High Sensitivity) 9 <18 ng/L    Comment: (NOTE) Elevated high sensitivity troponin I (hsTnI) values and significant  changes across serial measurements may suggest ACS but many other  chronic and acute conditions are known to elevate hsTnI results.  Refer to the "Links" section for chest pain algorithms and additional  guidance. Performed at [facility], [address], [place], [place] [address]    CT Angio Chest PE W and/or Wo Contrast  Result Date: 02/27/2020 CLINICAL DATA:  Shortness of breath and low heart rate EXAM: CT ANGIOGRAPHY CHEST WITH CONTRAST TECHNIQUE: Multidetector CT imaging of the chest was performed using the standard protocol during bolus administration of intravenous contrast. Multiplanar CT image reconstructions and MIPs were obtained to evaluate the vascular anatomy. CONTRAST:  138mL OMNIPAQUE IOHEXOL 350 MG/ML SOLN COMPARISON:  Radiograph same day FINDINGS: Cardiovascular: There is a optimal opacification of the pulmonary arteries. There is no central,segmental, or subsegmental filling  defects within the pulmonary arteries. There is prominence of the main pulmonary arteries measuring up to 2.8 cm, likely due to pulmonary arterial hypertension. There is mild cardiomegaly. Coronary artery calcifications are seen. No pericardial effusion or thickening. No evidence right heart strain. There is normal three-vessel brachiocephalic anatomy without proximal stenosis. Scattered aortic atherosclerosis. There is a mild amount of atherosclerosis  seen at the origin of the left subclavian artery. Mediastinum/Nodes: No hilar, mediastinal, or axillary adenopathy. Thyroid gland, trachea, and esophagus demonstrate no significant findings. Lungs/Pleura: Extensive centrilobular emphysematous changes with subpleural bleb formation is again identified. There is diffuse areas of ground-glass opacity and interstitial thickening seen predominantly at both lung bases. No pleural effusion is seen. No pneumothorax. Upper Abdomen: No acute abnormalities present in the visualized portions of the upper abdomen. Musculoskeletal: No chest wall abnormality. No acute or significant osseous findings. Review of the MIP images confirms the above findings. IMPRESSION: Extensive advanced centrilobular emphysematous changes with underlying diffuse ground-glass opacities and interstitial thickening at both lung bases which could be due to concomitant inflammatory/infectious etiology. No central, segmental, or subsegmental pulmonary embolism. Findings suggestive of pulmonary arterial hypertension. Aortic Atherosclerosis (ICD10-I70.0). Electronically Signed   By: Prudencio Pair [name]   On: 02/27/2020 23:59   DG Chest Port 1 View  Result Date: 02/27/2020 CLINICAL DATA:  Shortness of breath, hypoxia EXAM: PORTABLE CHEST 1 VIEW COMPARISON:  08/01/2019 FINDINGS: Single frontal view of the chest demonstrates extensive scarring and fibrosis unchanged since prior exam. No airspace disease, effusion, or pneumothorax. Cardiac silhouette is stable.  No acute bony abnormalities. IMPRESSION: 1. Extensive bilateral scarring and fibrosis. No acute airspace disease. Electronically Signed   By: Randa Ngo [name]   On: 02/27/2020 22:32    Pending Labs Unresulted Labs (From admission, onward)    Start     Ordered   03/06/20 0500  Creatinine, serum  (enoxaparin (LOVENOX)    CrCl >/= 30 ml/min)  Weekly,   R    Comments: while on enoxaparin therapy    02/28/20 0036   02/28/20 0500  Comprehensive metabolic panel  Tomorrow morning,   R     02/28/20 0036   02/28/20 0500  CBC WITH DIFFERENTIAL  Daily,   R     02/28/20 0036   02/28/20 0500  Hemoglobin A1c  Tomorrow morning,   R    Comments: To assess prior glycemic control    02/28/20 0039   02/28/20 0137  SARS CORONAVIRUS 2 (TAT 6-24 HRS) Nasopharyngeal Nasopharyngeal Swab  (Tier 3 (TAT 6-24 hrs))  Once,   STAT    Question Answer Comment  Is this test for diagnosis or screening Diagnosis of ill patient   Symptomatic for COVID-19 as defined by CDC Yes   Date of Symptom Onset 02/27/2020   Hospitalized for COVID-19 No   Admitted to ICU for COVID-19 No   Previously tested for COVID-19 No   Resident in a congregate (group) care setting No   Employed in healthcare setting No      02/28/20 0137   02/28/20 0038  Culture, sputum-assessment  Once,   STAT     02/28/20 0037   02/28/20 0033  Strep pneumoniae urinary antigen  Once,   STAT     02/28/20 0032   02/28/20 0012  Culture, blood (Routine X 2) w Reflex to ID Panel  BLOOD CULTURE X 2,   R (with STAT occurrences)     02/28/20 0012          Vitals/Pain Today's Vitals   02/27/20 2032 02/27/20 2036 02/27/20 2040 02/28/20 0025  BP: (!) 173/82   (!) 150/93  Pulse: (!) 58   96  Resp:    (!) 26  Temp: (!) 97.5 F (36.4 C)     TempSrc: Oral     SpO2: 90%   (!) 86%  Weight:   65.3 kg  Height:   5\' 4"  (1.626 m)   PainSc:  0-No pain      Isolation Precautions Airborne and Contact precautions  Medications Medications  cefTRIAXone  (ROCEPHIN) 1 g in sodium chloride 0.9 % 100 mL IVPB (1 g Intravenous New Bag/Given 02/28/20 0129)  azithromycin (ZITHROMAX) 500 mg in sodium chloride 0.9 % 250 mL IVPB (has no administration in time range)  enoxaparin (LOVENOX) injection 40 mg (40 mg Subcutaneous Given 02/28/20 0125)  acetaminophen (TYLENOL) tablet 650 mg (has no administration in time range)    Or  acetaminophen (TYLENOL) suppository 650 mg (has no administration in time range)  levalbuterol (XOPENEX) nebulizer solution 0.63 mg (has no administration in time range)  ipratropium (ATROVENT) nebulizer solution 0.5 mg (has no administration in time range)  levalbuterol (XOPENEX) nebulizer solution 0.63 mg (has no administration in time range)  methylPREDNISolone sodium succinate (SOLU-MEDROL) 40 mg/mL injection 40 mg (has no administration in time range)    Followed by  predniSONE (DELTASONE) tablet 40 mg (has no administration in time range)  insulin aspart (novoLOG) injection 0-15 Units (has no administration in time range)  methylPREDNISolone sodium succinate (SOLU-MEDROL) 125 mg/2 mL injection 80 mg (80 mg Intravenous Given 02/27/20 2209)  albuterol (VENTOLIN HFA) 108 (90 Base) MCG/ACT inhaler 4 puff (4 puffs Inhalation Given 02/27/20 2209)  iohexol (OMNIPAQUE) 350 MG/ML injection 100 mL (100 mLs Intravenous Contrast Given 02/27/20 2336)    Mobility walks with person assist Moderate fall risk   Focused Assessments     R Recommendations: See Admitting Provider Note  Report given to:   Bree RN on 300  Additional Notes:

## 2020-02-28 NOTE — H&P (Signed)
History and Physical    [name] [name] DOB: Mar 14, 1927 DOA: 02/27/2020  PCP: Christain Sacramento, MD   Patient coming from: Home.  I have personally briefly reviewed patient's old medical records in [place]  Chief Complaint: Shortness of breath and heart rate in the 40s.  HPI: [name] is a 86 y.o. male with medical history significant of allergic rhinitis, asthma, BPH, chronic respiratory failure, esophageal reflux, macular degeneration, glaucoma, hypertension, type 2 diabetes who is brought to the emergency department due to dyspnea.  He states that he has been wheezing, coughing with occasional sputum production, fatigue and malaise.  No fever, rhinorrhea or sore throat.  No hemoptysis.  He denies chest pain, palpitations, dizziness, diaphoresis, orthopnea or recent pitting edema of the lower extremities.  His appetite has been decreased, but denies abdominal pain, nausea, vomiting, diarrhea, constipation, melena or hematochezia.  No dysuria, frequency or hematuria.  No polyuria, polydipsia, polyphagia or blurred vision.  ED Course: Initial vital signs temperature 97.5 F, pulse 58, respirations 26, blood pressure 173/82 mmHg O2 sat 90% on 3 L via nasal cannula.  The patient was given 80 mg of Solu-Medrol, bronchodilators, ceftriaxone and azithromycin.  CBC had a white count 9.6, hemoglobin of 16.1 g/dL and platelets 231.  BNP was 84.0 pg/mL.  Troponin x2 was negative.  CMP shows a glucose of 139, calcium of 8.8 down normalizes to an albumin of 3.9 g/dL.  His total bilirubin was 1.5 mg/dL, the rest of the hepatic functions are within normal range.  Chest radiograph show extensive bilateral scarring and fibrosis with no acute airspace disease.  CTA chest showed extensive advanced centrilobular emphysematous changes with underlying diffuse groundglass opacities and interstitial thickening of both lung bases which could be due to concomitant inflammatory/infectious etiology.   No central, segmental or subsegmental PE.  There are findings suggesting pulmonary hypertension.  Review of Systems: As per HPI otherwise 10 point review of systems negative.   Past Medical History:  Diagnosis Date  . Allergic rhinitis, cause unspecified   . Asthma   . BPH (benign prostatic hyperplasia)   . Chronic airway obstruction, not elsewhere classified   . Esophageal reflux   . Macular degeneration     Past Surgical History:  Procedure Laterality Date  . TURP VAPORIZATION    . VASECTOMY       reports that he quit smoking about 28 years ago. His smoking use included cigarettes. He has a 100.00 pack-year smoking history. He has never used smokeless tobacco. He reports current alcohol use. He reports that he does not use drugs.  Allergies  Allergen Reactions  . Escitalopram Oxalate Palpitations  . Tape Rash and Other (See Comments)    Pulls off skin    Family History  Problem Relation Age of Onset  . Cancer Other        both parents   Prior to Admission medications   Medication Sig Start Date End Date Taking? Authorizing Provider  acetaminophen (TYLENOL) 325 MG tablet Take 650 mg by mouth every 6 (six) hours as needed for mild pain.    Yes [provider]  ascorbic acid (VITAMIN C) 500 MG tablet Take 500 mg by mouth daily.    Yes [provider]  bimatoprost (LUMIGAN) 0.01 % SOLN Place 1 drop into both eyes at bedtime.  12/20/14  Yes [provider]  brimonidine (ALPHAGAN) 0.2 % ophthalmic solution Place 1 drop into both eyes 2 (two) times daily. 01/18/20  Yes [provider]  dorzolamide (TRUSOPT) 2 % ophthalmic solution Place 1 drop into both eyes 3 (three) times daily.  11/24/19  Yes [provider]  doxazosin (CARDURA) 4 MG tablet Take 2 mg by mouth daily. 04/16/17  Yes [provider]  finasteride (PROSCAR) 5 MG tablet Take 2.5 mg by mouth daily.    Yes [provider]  Fluticasone-Salmeterol (ADVAIR)  100-50 MCG/DOSE AEPB Inhale 1 puff into the lungs 2 (two) times daily. 12/15/19  Yes Young, Tarri Fuller D, MD  gabapentin (NEURONTIN) 100 MG capsule Take 100 mg by mouth at bedtime.   Yes [provider]  latanoprost (XALATAN) 0.005 % ophthalmic solution Place 1 drop into both eyes at bedtime.  07/07/19  Yes [provider]  MELATONIN PO Take by mouth. prn   Yes [provider]  Multiple Vitamins-Minerals (ICAPS PO) Take 1 tablet by mouth daily.    Yes [provider]  pantoprazole (PROTONIX) 20 MG tablet Take 20 mg by mouth every other day.   Yes [provider]  Probiotic Product (ALIGN PO) Take by mouth. Take 1 capsule three times a week by mouth.   Yes [provider]  vitamin E 100 UNIT capsule Take 100 Units by mouth. Take by mouth twice a day   Yes [provider]    Physical Exam: Vitals:   02/27/20 2032 02/27/20 2040 02/28/20 0025  BP: (!) 173/82  (!) 150/93  Pulse: (!) 58  96  Resp:   (!) 26  Temp: (!) 97.5 F (36.4 C)    TempSrc: Oral    SpO2: 90%  (!) 86%  Weight:  65.3 kg   Height:  5\' 4"  (1.626 m)     Constitutional: Frail, elderly male. Eyes: PERRL, lids and conjunctivae normal ENMT: Mucous membranes are moist. Posterior pharynx clear of any exudate or lesions. Neck: normal, supple, no masses, no thyromegaly Respiratory: Decreased breath sounds with bilateral rhonchi, wheezing and scattered crackles.  Cardiovascular: Regular rate and rhythm, no murmurs / rubs / gallops. No extremity edema. 2+ pedal pulses. No carotid bruits.  Abdomen: Nondistended.  Soft, no tenderness, no masses palpated. No hepatosplenomegaly. Bowel sounds positive.  Musculoskeletal: no clubbing / cyanosis.  Good ROM, no contractures. Normal muscle tone.  Skin: Multiple hypopigmented macules on back with left upper back sutures.  Some areas of ecchymosis on extremities. Neurologic: CN 2-12 grossly intact. Sensation intact, DTR normal. Strength  5/5 in all 4.  Psychiatric:  Alert and oriented x 2. Normal mood.   Labs on Admission: I have personally reviewed following labs and imaging studies  CBC: Recent Labs  Lab 02/27/20 2204  WBC 9.6  NEUTROABS 6.8  HGB 16.1  HCT 49.5  MCV 92.9  PLT [id number]   Basic Metabolic Panel: Recent Labs  Lab 02/27/20 2204  NA 138  K 4.2  CL 104  CO2 25  GLUCOSE 139*  BUN 14  CREATININE 0.95  CALCIUM 8.8*   GFR: Estimated Creatinine Clearance: 43.3 mL/min (by C-G formula based on SCr of 0.95 mg/dL). Liver Function Tests: Recent Labs  Lab 02/27/20 2204  AST 22  ALT 19  ALKPHOS 93  BILITOT 1.5*  PROT 7.6  ALBUMIN 3.9   No results for input(s): LIPASE, AMYLASE in the last 168 hours. No results for input(s): AMMONIA in the last 168 hours. Coagulation Profile: No results for input(s): INR, PROTIME in the last 168 hours. Cardiac Enzymes: No results for input(s): CKTOTAL, CKMB, CKMBINDEX, TROPONINI in the last  168 hours. BNP (last 3 results) No results for input(s): PROBNP in the last 8760 hours. HbA1C: No results for input(s): HGBA1C in the last 72 hours. CBG: No results for input(s): GLUCAP in the last 168 hours. Lipid Profile: No results for input(s): CHOL, HDL, LDLCALC, TRIG, CHOLHDL, LDLDIRECT in the last 72 hours. Thyroid Function Tests: No results for input(s): TSH, T4TOTAL, FREET4, T3FREE, THYROIDAB in the last 72 hours. Anemia Panel: No results for input(s): VITAMINB12, FOLATE, FERRITIN, TIBC, IRON, RETICCTPCT in the last 72 hours. Urine analysis:  Radiological Exams on Admission: CT Angio Chest PE W and/or Wo Contrast  Result Date: 02/27/2020 CLINICAL DATA:  Shortness of breath and low heart rate EXAM: CT ANGIOGRAPHY CHEST WITH CONTRAST TECHNIQUE: Multidetector CT imaging of the chest was performed using the standard protocol during bolus administration of intravenous contrast. Multiplanar CT image reconstructions and MIPs were obtained to evaluate the vascular  anatomy. CONTRAST:  157mL OMNIPAQUE IOHEXOL 350 MG/ML SOLN COMPARISON:  Radiograph same day FINDINGS: Cardiovascular: There is a optimal opacification of the pulmonary arteries. There is no central,segmental, or subsegmental filling defects within the pulmonary arteries. There is prominence of the main pulmonary arteries measuring up to 2.8 cm, likely due to pulmonary arterial hypertension. There is mild cardiomegaly. Coronary artery calcifications are seen. No pericardial effusion or thickening. No evidence right heart strain. There is normal three-vessel brachiocephalic anatomy without proximal stenosis. Scattered aortic atherosclerosis. There is a mild amount of atherosclerosis seen at the origin of the left subclavian artery. Mediastinum/Nodes: No hilar, mediastinal, or axillary adenopathy. Thyroid gland, trachea, and esophagus demonstrate no significant findings. Lungs/Pleura: Extensive centrilobular emphysematous changes with subpleural bleb formation is again identified. There is diffuse areas of ground-glass opacity and interstitial thickening seen predominantly at both lung bases. No pleural effusion is seen. No pneumothorax. Upper Abdomen: No acute abnormalities present in the visualized portions of the upper abdomen. Musculoskeletal: No chest wall abnormality. No acute or significant osseous findings. Review of the MIP images confirms the above findings. IMPRESSION: Extensive advanced centrilobular emphysematous changes with underlying diffuse ground-glass opacities and interstitial thickening at both lung bases which could be due to concomitant inflammatory/infectious etiology. No central, segmental, or subsegmental pulmonary embolism. Findings suggestive of pulmonary arterial hypertension. Aortic Atherosclerosis (ICD10-I70.0). Electronically Signed   By: Prudencio Pair [name]   On: 02/27/2020 23:59   DG Chest Port 1 View  Result Date: 02/27/2020 CLINICAL DATA:  Shortness of breath, hypoxia EXAM:  PORTABLE CHEST 1 VIEW COMPARISON:  08/01/2019 FINDINGS: Single frontal view of the chest demonstrates extensive scarring and fibrosis unchanged since prior exam. No airspace disease, effusion, or pneumothorax. Cardiac silhouette is stable. No acute bony abnormalities. IMPRESSION: 1. Extensive bilateral scarring and fibrosis. No acute airspace disease. Electronically Signed   By: Randa Ngo [name]   On: 02/27/2020 22:32    EKG: Independently reviewed. Vent. rate 97 BPM PR interval * ms QRS duration 87 ms QT/QTc 353/449 ms P-R-T axes 53 9 42 Sinus arrhythmia Abnormal R-wave progression, early transition  Assessment/Plan Principal Problem:   CAP (community acquired pneumonia) Admit to telemetry/inpatient. Continue supplemental oxygen. Xopenex plus Atrovent. Continue Solu-Medrol. Switch to prednisone taper tomorrow a.m. Ceftriaxone 1 g every 24 hours. Azithromycin 500 mg every 24 hours. Check strep pneumoniae urinary antigen. Check sputum Gram stain, culture and sensitivity. Follow-up blood cultures and sensitivity.  Active Problems:   COPD exacerbation (HCC) Continue supplemental oxygen. Continue bronchodilators.    Esophageal reflux Continue PPI.    Essential hypertension Continue Cardura 2 mg  p.o. daily. Monitor blood pressure.    Type 2 diabetes mellitus with hyperglycemia (HCC) Carbohydrate modified diet. CBG monitoring with RI SS.    Primary open angle glaucoma (POAG) of both eyes, indeterminate stage Continue Alphagan, Lumigan, Xalatan and Trusopt drops.     Hyperbilirubinemia Follow-up bilirubin level.   DVT prophylaxis: Lovenox SQ. Code Status: Full code. Family Communication: Disposition Plan: Admit for IV antibiotic therapy for 2 to 3 days. Consults called: Admission status: Inpatient/telemetry.   Reubin Milan MD [facility]  If 7PM-7AM, please contact night-coverage [email or web address]  02/28/2020, 12:44 AM   This document was prepared  using Dragon voice recognition software and may contain some unintended transcription errors.

## 2020-02-28 NOTE — Progress Notes (Signed)
Initial Nutrition Assessment  DOCUMENTATION CODES:   Not applicable  INTERVENTION:  Ensure Enlive po BID, each supplement provides 350 kcal and 20 grams of protein   NUTRITION DIAGNOSIS:   Increased nutrient needs related to acute illness(CAP) as evidenced by estimated needs.  GOAL:   Patient will meet greater than or equal to 90% of their needs    MONITOR:   PO intake, Supplement acceptance, Labs, Weight trends, I & O's  REASON FOR ASSESSMENT:   Consult Assessment of nutrition requirement/status  ASSESSMENT:   RD working remotely.   86 year old male with past medical history of allergic rhinitis, asthma, BPH, chronic respiratory failure, esophageal reflux, macular degeneration, glaucoma, HTN, DM2, brought to ED due to dyspnea and reports decreased appetite, but denies abdominal pain, N/V/D, constipation, melena or hematochezia. CXR showed extensive bilateral scarring and fibrosis with no acute airspace disease, CTA chest showed extensive advanced centrilobular emphysematous changes with underlying diffuse groundglass opacities and interstitial thickening of both lung bases  Per notes: -mild cardiomegaly, started diltiazem and obtain echo per cardio -PT evaluation recommending home health PT -responding to treatments for CAP -monitor on telemetry overnight, likely d/c tomorrow  Per notes, pt is very HOH and hearing AIDS out of charge, unable to obtain nutrition history at this time. He is on a HH/CM diet, eating 75% x 2 documented meals this admission. Given his advanced age and past medical history, he is at risk for malnutrition and would benefit from nutrition supplement.   Current wt 142.34 lbs Limited recent wt history for review, on 08/01/19 pt wt 139.48 lbs with stable weight history over the last 5 years.  Medications reviewed and include: Gabapentin, SSI, Melatonin, Methylprednisolone IVPB: Zithromax, Rocephin  Labs: CBGs 148-203   NUTRITION - FOCUSED  PHYSICAL EXAM: Unable to complete at this time, RD working remotely.  Diet Order:   Diet Order            Diet heart healthy/carb modified Room service appropriate? Yes; Fluid consistency: Thin  Diet effective now              EDUCATION NEEDS:   No education needs have been identified at this time  Skin:  Skin Assessment: Reviewed RN Assessment  Last BM:  3/22  Height:   Ht Readings from Last 1 Encounters:  02/28/20 5\' 5"  (1.651 m)    Weight:   Wt Readings from Last 1 Encounters:  02/28/20 64.7 kg    Ideal Body Weight:  61.8 kg  BMI:  Body mass index is 23.74 kg/m.  Estimated Nutritional Needs:   Kcal:  1800-2000  Protein:  90-100  Fluid:  >/= 1.7 L/day   Lajuan Lines, RD, LDN Clinical Nutrition After Hours/Weekend Pager # in [place]

## 2020-02-28 NOTE — Evaluation (Signed)
Physical Therapy Evaluation Patient Details Name: [name] MRN: [id number] DOB: Mar 28, 1927 Today's Date: 02/28/2020   History of Present Illness  [name] is a 86 y.o. male with medical history significant of allergic rhinitis, asthma, BPH, chronic respiratory failure, esophageal reflux, macular degeneration, glaucoma, hypertension, type 2 diabetes who is brought to the emergency department due to dyspnea.  He states that he has been wheezing, coughing with occasional sputum production, fatigue and malaise.  No fever, rhinorrhea or sore throat.  No hemoptysis.  He denies chest pain, palpitations, dizziness, diaphoresis, orthopnea or recent pitting edema of the lower extremities.  His appetite has been decreased, but denies abdominal pain, nausea, vomiting, diarrhea, constipation, melena or hematochezia.  No dysuria, frequency or hematuria.  No polyuria, polydipsia, polyphagia or blurred vision.    Clinical Impression  Patient limited for functional mobility as stated below secondary to BLE weakness, fatigue and fair/poor standing balance.  Patient ambulated in room with slow labored cadence having to lean on nearby objects for support, limited mostly due to SOB with SpO2 dropping from 94% to 77%.  Patient tolerated sitting up in chair after therapy - RN notified.  Patient will benefit from continued physical therapy in hospital and recommended venue below to increase strength, balance, endurance for safe ADLs and gait.     Follow Up Recommendations Home health PT;Supervision for mobility/OOB;Supervision - Intermittent    Equipment Recommendations  None recommended by PT    Recommendations for Other Services       Precautions / Restrictions Precautions Precautions: Fall Restrictions Weight Bearing Restrictions: No      Mobility  Bed Mobility Overal bed mobility: Modified Independent             General bed mobility comments: increased time  Transfers Overall transfer  level: Needs assistance Equipment used: None;1 person hand held assist Transfers: Sit to/from Stand;Stand Pivot Transfers Sit to Stand: Min guard Stand pivot transfers: Min guard       General transfer comment: tendency to lean on nearby objects for support  Ambulation/Gait Ambulation/Gait assistance: Min guard Gait Distance (Feet): 30 Feet Assistive device: 1 person hand held assist;None Gait Pattern/deviations: Decreased step length - right;Decreased step length - left;Decreased stride length Gait velocity: decreased   General Gait Details: slow labored cadence with tendency to hang onto bed rail and nearby objects for support, no loss of balance, limited secondary to fatigue and SpO2 dropping from 94% to 75% while on 4 LPM O2  Stairs            Wheelchair Mobility    Modified Rankin (Stroke Patients Only)       Balance Overall balance assessment: Needs assistance Sitting-balance support: Feet supported;No upper extremity supported Sitting balance-Leahy Scale: Good Sitting balance - Comments: seated at EOB   Standing balance support: During functional activity;No upper extremity supported Standing balance-Leahy Scale: Poor Standing balance comment: fair/poor with hand held assist                             Pertinent Vitals/Pain Pain Assessment: Faces Faces Pain Scale: Hurts a little bit Pain Location: posterior neck Pain Descriptors / Indicators: Aching;Sore Pain Intervention(s): Limited activity within patient's tolerance;Monitored during session;Repositioned    Home Living Family/patient expects to be discharged to:: Private residence Living Arrangements: Alone Available Help at Discharge: Family;Available PRN/intermittently Type of Home: House Home Access: Ramped entrance     Home Layout: One level Home Equipment:  Walker - 2 wheels;Cane - single point Additional Comments: Pt unable to give PLOF due to [facility]    Prior Function Level of  Independence: Independent with assistive device(s)         Comments: Pt reports he's been using a RW for stability lately. Reports independence in ADLs     Hand Dominance   Dominant Hand: Right    Extremity/Trunk Assessment   Upper Extremity Assessment Upper Extremity Assessment: Defer to OT evaluation    Lower Extremity Assessment Lower Extremity Assessment: Generalized weakness    Cervical / Trunk Assessment Cervical / Trunk Assessment: Normal  Communication   Communication: HOH  Cognition Arousal/Alertness: Awake/alert Behavior During Therapy: WFL for tasks assessed/performed Overall Cognitive Status: Within Functional Limits for tasks assessed                                        General Comments      Exercises     Assessment/Plan    PT Assessment Patient needs continued PT services  PT Problem List Decreased strength;Decreased activity tolerance;Decreased balance;Decreased mobility;Cardiopulmonary status limiting activity       PT Treatment Interventions Balance training;Gait training;Stair training;Functional mobility training;Therapeutic activities;Therapeutic exercise;Patient/family education    PT Goals (Current goals can be found in the Care Plan section)  Acute Rehab PT Goals Patient Stated Goal: return home with family/friends to assist PT Goal Formulation: With patient Time For Goal Achievement: 03/06/20 Potential to Achieve Goals: Good    Frequency Min 3X/week   Barriers to discharge        Co-evaluation PT/OT/SLP Co-Evaluation/Treatment: Yes Reason for Co-Treatment: Complexity of the patient's impairments (multi-system involvement) PT goals addressed during session: Mobility/safety with mobility;Balance;Proper use of DME OT goals addressed during session: ADL's and self-care       AM-PAC PT "6 Clicks" Mobility  Outcome Measure Help needed turning from your back to your side while in a flat bed without using  bedrails?: None Help needed moving from lying on your back to sitting on the side of a flat bed without using bedrails?: None Help needed moving to and from a bed to a chair (including a wheelchair)?: A Little Help needed standing up from a chair using your arms (e.g., wheelchair or bedside chair)?: A Little Help needed to walk in hospital room?: A Little Help needed climbing 3-5 steps with a railing? : A Lot 6 Click Score: 19    End of Session Equipment Utilized During Treatment: Oxygen Activity Tolerance: Patient tolerated treatment well;Patient limited by fatigue Patient left: in chair;with call bell/phone within reach;with chair alarm set Nurse Communication: Mobility status PT Visit Diagnosis: Unsteadiness on feet (R26.81);Other abnormalities of gait and mobility (R26.89);Muscle weakness (generalized) (M62.81)    Time: [id number] PT Time Calculation (min) (ACUTE ONLY): 33 min   Charges:   PT Evaluation $PT Eval Moderate Complexity: 1 Mod PT Treatments $Therapeutic Activity: 23-37 mins        12:18 PM, 02/28/20 Lonell Grandchild, MPT Physical Therapist with [facility] [phone] office [phone] mobile phone

## 2020-02-28 NOTE — Progress Notes (Signed)
Patient has had bursts of tachycardia  (up to 140s) during the night.  Retrieved EKG and did inform doctor of patient tachycardia.  No new orders received at this time.

## 2020-02-28 NOTE — ED Notes (Signed)
Pt assisted to bsc and back to bed; pt O2 sats dropped to lower 80's, O2 increased to 4l via [place]

## 2020-02-28 NOTE — Progress Notes (Signed)
PROLONGED SERVICE CARE NOTE   02/28/2020 2:23 PM  [name] was seen and examined.  The H&P by the admitting provider, orders, imaging was reviewed.  Please see new orders.  Telemetry reviewed and patient has been having some spells of tachycardia MAT and concerns of atrial arrhythmia and some reported bradycardia.  I have asked for cardiology consultation to be done while patient in hospital.  I have asked for physical therapy evaluation.  They recommending home health PT which has been ordered.  I counseled with daughter at bedside extensively and answered her questions.  The patient and daughter tell me that he is DO NOT RESUSCITATE and this has been present since prior to admission.  They brought his [place] DNR form that has been signed.  I have changed the patient to DNR status.  The patient has been started on diltiazem by cardiology and we will monitor on telemetry overnight and if stable could likely discharge home tomorrow with family and home health PT as arranged.  Patient is due for suture removal tomorrow by his surgeon from recent skin surgeries.  Have asked for the RN to remove sutures while in hospital.  The patient seems to be responding to current treatments for pneumonia.  Will continue to follow.    Principal Problem:   CAP (community acquired pneumonia) Active Problems:   Esophageal reflux   COPD exacerbation ([place])   Essential hypertension   Type 2 diabetes mellitus with hyperglycemia ([place])   Primary open angle glaucoma (POAG) of both eyes, indeterminate stage  Vitals:   02/28/20 0600 02/28/20 1000  BP: 120/62 127/72  Pulse:  87  Resp: 18 18  Temp: 98 F (36.7 C)   SpO2: 93% 94%    Results for orders placed or performed during the hospital encounter of 02/27/20  Culture, blood (Routine X 2) w Reflex to ID Panel   Specimen: BLOOD  Result Value Ref Range   Specimen Description BLOOD LEFT ANTECUBITAL    Special Requests      BOTTLES DRAWN AEROBIC AND ANAEROBIC  Blood Culture adequate volume   Culture      NO GROWTH < 12 HOURS Performed at [facility], [address] [place], [place] [address]    Report Status PENDING   Culture, blood (Routine X 2) w Reflex to ID Panel   Specimen: BLOOD  Result Value Ref Range   Specimen Description BLOOD LEFT ANTECUBITAL    Special Requests      BOTTLES DRAWN AEROBIC AND ANAEROBIC Blood Culture adequate volume   Culture      NO GROWTH < 12 HOURS Performed at [facility], [address] [place], [place] [address]    Report Status PENDING   SARS CORONAVIRUS 2 (TAT 6-24 HRS) Nasopharyngeal Nasopharyngeal Swab   Specimen: Nasopharyngeal Swab  Result Value Ref Range   SARS Coronavirus 2 NEGATIVE NEGATIVE  Comprehensive metabolic panel  Result Value Ref Range   Sodium 138 135 - 145 mmol/L   Potassium 4.2 3.5 - 5.1 mmol/L   Chloride 104 98 - 111 mmol/L   CO2 25 22 - 32 mmol/L   Glucose, Bld 139 (H) 70 - 99 mg/dL   BUN 14 8 - 23 mg/dL   Creatinine, Ser 0.95 0.61 - 1.24 mg/dL   Calcium 8.8 (L) 8.9 - 10.3 mg/dL   Total Protein 7.6 6.5 - 8.1 g/dL   Albumin 3.9 3.5 - 5.0 g/dL   AST 22 15 - 41 U/L   ALT 19 0 -  44 U/L   Alkaline Phosphatase 93 38 - 126 U/L   Total Bilirubin 1.5 (H) 0.3 - 1.2 mg/dL   GFR calc non Af Amer >60 >60 mL/min   GFR calc Af Amer >60 >60 mL/min   Anion gap 9 5 - 15  CBC with Differential  Result Value Ref Range   WBC 9.6 4.0 - 10.5 K/uL   RBC 5.33 4.22 - 5.81 MIL/uL   Hemoglobin 16.1 13.0 - 17.0 g/dL   HCT 49.5 39.0 - 52.0 %   MCV 92.9 80.0 - 100.0 fL   MCH 30.2 26.0 - 34.0 pg   MCHC 32.5 30.0 - 36.0 g/dL   RDW 13.2 11.5 - 15.5 %   Platelets 231 150 - 400 K/uL   nRBC 0.0 0.0 - 0.2 %   Neutrophils Relative % 70 %   Neutro Abs 6.8 1.7 - 7.7 K/uL   Lymphocytes Relative 19 %   Lymphs Abs 1.8 0.7 - 4.0 K/uL   Monocytes Relative 7 %   Monocytes Absolute 0.6 0.1 - 1.0 K/uL   Eosinophils Relative 3 %   Eosinophils Absolute 0.3 0.0 - 0.5 K/uL   Basophils Relative 1 %    Basophils Absolute 0.1 0.0 - 0.1 K/uL   Immature Granulocytes 0 %   Abs Immature Granulocytes 0.03 0.00 - 0.07 K/uL  Brain natriuretic peptide  Result Value Ref Range   B Natriuretic Peptide 84.0 0.0 - 100.0 pg/mL  Comprehensive metabolic panel  Result Value Ref Range   Sodium 138 135 - 145 mmol/L   Potassium 4.2 3.5 - 5.1 mmol/L   Chloride 105 98 - 111 mmol/L   CO2 23 22 - 32 mmol/L   Glucose, Bld 203 (H) 70 - 99 mg/dL   BUN 13 8 - 23 mg/dL   Creatinine, Ser 0.82 0.61 - 1.24 mg/dL   Calcium 8.6 (L) 8.9 - 10.3 mg/dL   Total Protein 7.2 6.5 - 8.1 g/dL   Albumin 3.5 3.5 - 5.0 g/dL   AST 21 15 - 41 U/L   ALT 19 0 - 44 U/L   Alkaline Phosphatase 91 38 - 126 U/L   Total Bilirubin 1.3 (H) 0.3 - 1.2 mg/dL   GFR calc non Af Amer >60 >60 mL/min   GFR calc Af Amer >60 >60 mL/min   Anion gap 10 5 - 15  CBC WITH DIFFERENTIAL  Result Value Ref Range   WBC 5.0 4.0 - 10.5 K/uL   RBC 5.20 4.22 - 5.81 MIL/uL   Hemoglobin 15.6 13.0 - 17.0 g/dL   HCT 48.3 39.0 - 52.0 %   MCV 92.9 80.0 - 100.0 fL   MCH 30.0 26.0 - 34.0 pg   MCHC 32.3 30.0 - 36.0 g/dL   RDW 13.2 11.5 - 15.5 %   Platelets 228 150 - 400 K/uL   nRBC 0.0 0.0 - 0.2 %   Neutrophils Relative % 89 %   Neutro Abs 4.4 1.7 - 7.7 K/uL   Lymphocytes Relative 10 %   Lymphs Abs 0.5 (L) 0.7 - 4.0 K/uL   Monocytes Relative 1 %   Monocytes Absolute 0.0 (L) 0.1 - 1.0 K/uL   Eosinophils Relative 0 %   Eosinophils Absolute 0.0 0.0 - 0.5 K/uL   Basophils Relative 0 %   Basophils Absolute 0.0 0.0 - 0.1 K/uL   Immature Granulocytes 0 %   Abs Immature Granulocytes 0.02 0.00 - 0.07 K/uL  Hemoglobin A1c  Result Value Ref Range   Hgb A1c  MFr Bld 6.8 (H) 4.8 - 5.6 %   Mean Plasma Glucose 148.46 mg/dL  Glucose, capillary  Result Value Ref Range   Glucose-Capillary 203 (H) 70 - 99 mg/dL  Glucose, capillary  Result Value Ref Range   Glucose-Capillary 148 (H) 70 - 99 mg/dL  Troponin I (High Sensitivity)  Result Value Ref Range   Troponin I  (High Sensitivity) 10 <18 ng/L  Troponin I (High Sensitivity)  Result Value Ref Range   Troponin I (High Sensitivity) 9 <18 ng/L   Time Spent: 35 minutes   Murvin Natal, MD [facility]   02/27/2020  9:37 PM How to contact the [facility] Attending or Consulting provider [place] or covering provider during after hours 7P -7A, for this patient?  1. Check the care team in [facility] and look for a) attending/consulting [facility] provider listed and b) the [facility] team listed 2. Log into [email or web address] and use [place]'s universal password to access. If you do not have the password, please contact the hospital operator. 3. Locate the [facility] provider you are looking for under [facility] and page to a number that you can be directly reached. 4. If you still have difficulty reaching the provider, please page the [facility] (Director on Call) for the Hospitalists listed on amion for assistance.

## 2020-02-28 NOTE — Consult Note (Addendum)
Cardiology Consultation:   Patient ID: [name] MRN: [id number]; DOB: August 12, 1927  Admit date: 02/27/2020 Date of Consult: 02/28/2020  Primary Care Provider: Christain [place], MD Primary Cardiologist: No primary care provider on file. new  Primary Electrophysiologist:  None    Patient Profile:   [name] is a 86 y.o. male with a hx of COPD, on home 02, skin cancers, borderline diabetic, HTN, remote tobacco use who is being seen today for the evaluation of tachycardia at the request of Dr. Wynetta Emery.  History of Present Illness:   [name] with above hx and on home oxygen most of time for his COPD, and recently see by PCP for irregular heart beats.  He also has increased his oxygen from 2 L to 3 L.  EKG at that time with SR with PACs, poor ant R wave progression HR 88.  Labs with Hgb of 15, A1C of 6.7 Cr 0.86, total bili 1.3 and TSH 1.55  Pt presented to ER last evening with HR in the 40s at home and low 02 sats in the 70s. Felt like he would pass out.  He has been wheezing and coughing + fatigue, no fevers, chest pain awareness of HR, or edema..    HS Troponins 10 and 9 Na 138, K+ 4.2, glucose 203 Cr 0.82 Total bilie 1.3 to 1.5  BNP 84  hgb 15.6 plts 228  EKG:  The EKG was personally reviewed and demonstrates:  ST at 106, with PACs and PVC, Q waves in V1 & 2 old, no acute ST changes, + pause after PAC.  Telemetry:  Telemetry was personally reviewed and demonstrates:  During the night, SR then PAT vs A flutter with HR 141 also ST with PACs.   Chest radiograph show extensive bilateral scarring and fibrosis with no acute airspace disease.  CTA chest showed extensive advanced centrilobular emphysematous changes with underlying diffuse groundglass opacities and interstitial thickening of both lung bases which could be due to concomitant inflammatory/infectious etiology.  No central, segmental or subsegmental PE.  There are findings suggesting pulmonary hypertension.  There is mild  cardiomegaly.  Coronary artery calcifications are seen.  Currently very hard of hearing and hearing AIDS out of charge.  He tells me he feels like he will faint at times and at times he feels his heart racing though when he went to have sutures removed he was not aware of rapid HR.   No chest pain.    Past Medical History:  Diagnosis Date  . Allergic rhinitis, cause unspecified   . Asthma   . BPH (benign prostatic hyperplasia)   . Chronic airway obstruction, not elsewhere classified   . Esophageal reflux   . Macular degeneration     Past Surgical History:  Procedure Laterality Date  . TURP VAPORIZATION    . VASECTOMY       Home Medications:  Prior to Admission medications   Medication Sig Start Date End Date Taking? Authorizing Provider  acetaminophen (TYLENOL) 325 MG tablet Take 650 mg by mouth every 6 (six) hours as needed for mild pain.    Yes [provider]  ascorbic acid (VITAMIN C) 500 MG tablet Take 500 mg by mouth daily.    Yes [provider]  bimatoprost (LUMIGAN) 0.01 % SOLN Place 1 drop into both eyes at bedtime.  12/20/14  Yes [provider]  brimonidine (ALPHAGAN) 0.2 % ophthalmic solution Place 1 drop into both eyes 2 (two) times daily. 01/18/20  Yes  [provider]  dorzolamide (TRUSOPT) 2 % ophthalmic solution Place 1 drop into both eyes 3 (three) times daily.  11/24/19  Yes [provider]  doxazosin (CARDURA) 4 MG tablet Take 2 mg by mouth daily. 04/16/17  Yes [provider]  finasteride (PROSCAR) 5 MG tablet Take 2.5 mg by mouth daily.    Yes [provider]  gabapentin (NEURONTIN) 100 MG capsule Take 100 mg by mouth at bedtime.   Yes [provider]  latanoprost (XALATAN) 0.005 % ophthalmic solution Place 1 drop into both eyes at bedtime.  07/07/19  Yes [provider]  MELATONIN PO Take by mouth. prn   Yes [provider]  Multiple Vitamins-Minerals (ICAPS PO) Take 1 tablet  by mouth daily.    Yes [provider]  pantoprazole (PROTONIX) 20 MG tablet Take 20 mg by mouth every other day.   Yes [provider]  Probiotic Product (ALIGN PO) Take by mouth. Take 1 capsule three times a week by mouth.   Yes [provider]  vitamin E 100 UNIT capsule Take 100 Units by mouth. Take by mouth twice a day   Yes [provider]  ADVAIR DISKUS 100-50 MCG/DOSE AEPB USE 1 PUFF TWICE DAILY. RINSE MOUTH WITH WATER AFTER EACH USE. 02/28/20   Deneise Lever, MD    Inpatient Medications: Scheduled Meds: . brimonidine  1 drop Both Eyes BID  . dorzolamide  1 drop Both Eyes TID  . doxazosin  2 mg Oral Daily  . enoxaparin (LOVENOX) injection  40 mg Subcutaneous Q24H  . finasteride  2.5 mg Oral Daily  . gabapentin  100 mg Oral QHS  . insulin aspart  0-15 Units Subcutaneous TID WC  . ipratropium  0.5 mg Nebulization Q6H  . latanoprost  1 drop Both Eyes QHS  . levalbuterol  0.63 mg Nebulization Q6H  . melatonin  6 mg Oral QHS  . methylPREDNISolone (SOLU-MEDROL) injection  40 mg Intravenous Q6H   Followed by  . [START ON 02/29/2020] predniSONE  40 mg Oral Q breakfast   Continuous Infusions: . azithromycin 500 mg (02/28/20 0245)  . cefTRIAXone (ROCEPHIN)  IV 1 g (02/28/20 0129)   PRN Meds: acetaminophen **OR** acetaminophen, levalbuterol  Allergies:    Allergies  Allergen Reactions  . Escitalopram Oxalate Palpitations  . Tape Rash and Other (See Comments)    Pulls off skin    Social History:   Social History   Socioeconomic History  . Marital status: Married    Spouse name: Not on file  . Number of children: Not on file  . Years of education: Not on file  . Highest education level: Not on file  Occupational History  . Occupation: Retired    Comment: Charity fundraiser and farming  Tobacco Use  . Smoking status: Former Smoker    Packs/day: 2.00    Years: 50.00    Pack years: 100.00    Types: Cigarettes    Quit date: 12/09/1991    Years  since quitting: 28.2  . Smokeless tobacco: Never Used  Substance and Sexual Activity  . Alcohol use: Yes    Comment: occasional glass of red wine  . Drug use: Never  . Sexual activity: Never  Other Topics Concern  . Not on file  Social History Narrative  . Not on file   Social Determinants of Health   Financial Resource Strain:   . Difficulty of Paying Living Expenses:   Food Insecurity:   . Worried About  Running Out of Food in the Last Year:   . [place] in the Last Year:   Transportation Needs:   . Lack of Transportation (Medical):   Marland Kitchen Lack of Transportation (Non-Medical):   Physical Activity:   . Days of Exercise per Week:   . Minutes of Exercise per Session:   Stress:   . Feeling of Stress :   Social Connections:   . Frequency of Communication with Friends and Family:   . Frequency of Social Gatherings with Friends and Family:   . Attends Religious Services:   . Active Member of Clubs or Organizations:   . Attends Archivist Meetings:   Marland Kitchen Marital Status:   Intimate Partner Violence:   . Fear of Current or Ex-Partner:   . Emotionally Abused:   Marland Kitchen Physically Abused:   . Sexually Abused:     Family History:    Family History  Problem Relation Age of Onset  . Cancer Other        both parents     ROS:  Please see the history of present illness.  General:no colds or fevers, no weight changes Skin:no rashes or ulcers- skin cnacers removed with sutures on back and wrist  HEENT:no blurred vision, no congestion CV:see HPI PUL:see HPI GI:no diarrhea constipation or melena, no indigestion GU:no hematuria, no dysuria MS:no joint pain, no claudication Neuro:no syncope but feels like he will pass out at times, no lightheadedness Endo:+ diabetes new, no thyroid disease  All other ROS reviewed and negative.     Physical Exam/Data:   Vitals:   02/28/20 0130 02/28/20 0145 02/28/20 0200 02/28/20 0600  BP: 125/77  (!) 146/90 120/62  Pulse: (!) 34 (!)  40    Resp: 20 18 20 18   Temp:    98 F (36.7 C)  TempSrc:      SpO2:  91% 98% 93%  Weight:   64.7 kg   Height:   5\' 5"  (1.651 m)    No intake or output data in the 24 hours ending 02/28/20 0914 Last 3 Weights 02/28/2020 02/27/2020 08/01/2019  Weight (lbs) 142 lb 10.2 oz 144 lb 139 lb 12.8 oz  Weight (kg) 64.7 kg 65.318 kg 63.413 kg     Body mass index is 23.74 kg/m.  General:  Frail male, in no acute distress sitting in chair HEENT: normal Lymph: no adenopathy Neck: no JVD Endocrine:  No thryomegaly Vascular: No carotid bruits; pedal pulses 1+ bilaterally without bruits  Cardiac:  normal S1, S2; RRR; with freq premature beats no murmur gallup rub or click  Lungs:  clear to auscultation bilaterally, no wheezing, rhonchi or rales  Abd: soft, nontender, no hepatomegaly  Ext: no edema Musculoskeletal:  No deformities, BUE and BLE strength normal and equal Skin: warm and dry  Neuro:  Alert and oriented, Hard of hearing but when can understand answers questions approp , no focal abnormalities noted Psych:  Normal affect    Relevant CV Studies: None Echo pending  Laboratory Data:  High Sensitivity Troponin:   Recent Labs  Lab 02/27/20 2204 02/28/20 0005  TROPONINIHS 10 9     Chemistry Recent Labs  Lab 02/27/20 2204 02/28/20 0614  NA 138 138  K 4.2 4.2  CL 104 105  CO2 25 23  GLUCOSE 139* 203*  BUN 14 13  CREATININE 0.95 0.82  CALCIUM 8.8* 8.6*  GFRNONAA >60 >60  GFRAA >60 >60  ANIONGAP 9 10    Recent Labs  Lab 02/27/20 2204 02/28/20 0614  PROT 7.6 7.2  ALBUMIN 3.9 3.5  AST 22 21  ALT 19 19  ALKPHOS 93 91  BILITOT 1.5* 1.3*   Hematology Recent Labs  Lab 02/27/20 2204 02/28/20 0614  WBC 9.6 5.0  RBC 5.33 5.20  HGB 16.1 15.6  HCT 49.5 48.3  MCV 92.9 92.9  MCH 30.2 30.0  MCHC 32.5 32.3  RDW 13.2 13.2  PLT 231 228   BNP Recent Labs  Lab 02/27/20 2204  BNP 84.0    DDimer No results for input(s): DDIMER in the last 168  hours.   Radiology/Studies:  CT Angio Chest PE W and/or Wo Contrast  Result Date: 02/27/2020 CLINICAL DATA:  Shortness of breath and low heart rate EXAM: CT ANGIOGRAPHY CHEST WITH CONTRAST TECHNIQUE: Multidetector CT imaging of the chest was performed using the standard protocol during bolus administration of intravenous contrast. Multiplanar CT image reconstructions and MIPs were obtained to evaluate the vascular anatomy. CONTRAST:  130mL OMNIPAQUE IOHEXOL 350 MG/ML SOLN COMPARISON:  Radiograph same day FINDINGS: Cardiovascular: There is a optimal opacification of the pulmonary arteries. There is no central,segmental, or subsegmental filling defects within the pulmonary arteries. There is prominence of the main pulmonary arteries measuring up to 2.8 cm, likely due to pulmonary arterial hypertension. There is mild cardiomegaly. Coronary artery calcifications are seen. No pericardial effusion or thickening. No evidence right heart strain. There is normal three-vessel brachiocephalic anatomy without proximal stenosis. Scattered aortic atherosclerosis. There is a mild amount of atherosclerosis seen at the origin of the left subclavian artery. Mediastinum/Nodes: No hilar, mediastinal, or axillary adenopathy. Thyroid gland, trachea, and esophagus demonstrate no significant findings. Lungs/Pleura: Extensive centrilobular emphysematous changes with subpleural bleb formation is again identified. There is diffuse areas of ground-glass opacity and interstitial thickening seen predominantly at both lung bases. No pleural effusion is seen. No pneumothorax. Upper Abdomen: No acute abnormalities present in the visualized portions of the upper abdomen. Musculoskeletal: No chest wall abnormality. No acute or significant osseous findings. Review of the MIP images confirms the above findings. IMPRESSION: Extensive advanced centrilobular emphysematous changes with underlying diffuse ground-glass opacities and interstitial  thickening at both lung bases which could be due to concomitant inflammatory/infectious etiology. No central, segmental, or subsegmental pulmonary embolism. Findings suggestive of pulmonary arterial hypertension. Aortic Atherosclerosis (ICD10-I70.0). Electronically Signed   By: Prudencio Pair [name]   On: 02/27/2020 23:59   DG Chest Port 1 View  Result Date: 02/27/2020 CLINICAL DATA:  Shortness of breath, hypoxia EXAM: PORTABLE CHEST 1 VIEW COMPARISON:  08/01/2019 FINDINGS: Single frontal view of the chest demonstrates extensive scarring and fibrosis unchanged since prior exam. No airspace disease, effusion, or pneumothorax. Cardiac silhouette is stable. No acute bony abnormalities. IMPRESSION: 1. Extensive bilateral scarring and fibrosis. No acute airspace disease. Electronically Signed   By: Randa Ngo [name]   On: 02/27/2020 22:32        NO CHEST PAIN Assessment and Plan:   Tachycardia - ST , ST with PACs and may be PSVT vs atrial tach or a flutter HR up to 140s  Add po cardizem for now and check echo for structural issues  With his COPD not unexpected to have arrhythmias.  Concern adding BB with his COPD.   Dx of PNA this admit as well may be adding to tachycardia.   Will defer to Dr. Harl Bowie if PA flutter vs atrial tach.  CHAD2S2VAC of 4    Bradycardia? - his family notes HR in 67s and on  VS here listed low, this all may be PACs with pauses or PVCs with pauses causing slow radial pulse but he is symptomatic with near syncope.  Continue to monitor especially with dilt.    CAP/COPD exacerbation  With desat prior to admit and increased 02 at home   --per IM now on ABX and steroids   HTN elevated this AM  On cardura will add cardizem 30 mg every 6 hours.    BPH on proscar and cardura  DM-2, new per IM   Hyperbilirubinemia per IM         For questions or updates, please contact [place] Please consult [email or web address] for contact info under     Signed, Cecilie Kicks, NP  02/28/2020  9:14 AM  Attending note Patient seen and discussed with NP Dorene Ar, I agree with her documentatin. 86 yo male history of asthma, BPH, HTN, DM2, home O2 presented with SOB, cough, wheezing.    K 4.2 Cr 0.95 WBC 9.6 Hgb 16.1 Plt 231 BNP 84 hstrop 10-->9 EKG SR PACs, another shows MAT  CXR extensive bilateral scarring and fibrosis, no acute airspace disease CT PE No PE. Extensive advanced centrilobular emphysematous changes with underlying diffuse ground-glass opacities and interstitial thickening at both lung bases which could be due to concomitant inflammatory/infectious etiology.   Sinus rhythm with PACs, episodes of MAT in patient with chronic lung disease and acute pneumonia.  No afib or aflutter to warrant anticoagulation. Likely exacerbated by pneumonia and COPD exacerbation, bronchodilators and steroids. Agree with starting diltiazem and obtaining echo, continue to follow telemetry. Keep K at 4, Mag at 2. Will add TSH and Mg on tomorrows Am labs. I have not seen any low HRs, I think the home monitor may have been off due to PACs and MAT.    Carlyle Dolly MD

## 2020-02-28 NOTE — Evaluation (Signed)
Occupational Therapy Evaluation Patient Details Name: [name] MRN: [id number] DOB: 08-11-1927 Today's Date: 02/28/2020    History of Present Illness [name] is a 86 y.o. male with medical history significant of allergic rhinitis, asthma, BPH, chronic respiratory failure, esophageal reflux, macular degeneration, glaucoma, hypertension, type 2 diabetes who is brought to the emergency department due to dyspnea.  He states that he has been wheezing, coughing with occasional sputum production, fatigue and malaise.  No fever, rhinorrhea or sore throat.  No hemoptysis.  He denies chest pain, palpitations, dizziness, diaphoresis, orthopnea or recent pitting edema of the lower extremities.  His appetite has been decreased, but denies abdominal pain, nausea, vomiting, diarrhea, constipation, melena or hematochezia.  No dysuria, frequency or hematuria.  No polyuria, polydipsia, polyphagia or blurred vision.   Clinical Impression   Pt agreeable to OT evaluation, PLOF information limited due to pt extremely HOH; has hearing aides in room but they are not charged and the charger is not here. Pt able to understand and answer simple question when spoken close to his right ear and spoken slowly. Pt appears at baseline with ADL completion and functional mobility. He is primarily limited by O2 saturation, decreasing from 90 to 81 during mobility. Attempted to guide through PLB however pt HOH and unable to understand. Pt left seated up in chair, with call light, understands to use light to call RN if needed. No further OT services required at this time.      Follow Up Recommendations  No OT follow up;Supervision - Intermittent    Equipment Recommendations  Tub/shower seat       Precautions / Restrictions Precautions Precautions: Fall Restrictions Weight Bearing Restrictions: No      Mobility Bed Mobility                  Transfers Overall transfer level: Needs assistance Equipment used:  None Transfers: Sit to/from Stand;Stand Pivot Transfers Sit to Stand: Min guard Stand pivot transfers: Min guard                ADL either performed or assessed with clinical judgement   ADL Overall ADL's : Needs assistance/impaired Eating/Feeding: Modified independent;Sitting   Grooming: Wash/dry hands;Min guard;Standing           Upper Body Dressing : Minimal assistance;Sitting Upper Body Dressing Details (indicate cue type and reason): assist for managing gown ties behind back     Toilet Transfer: Supervision/safety;Stand-pivot Armed forces technical officer Details (indicate cue type and reason): simulated with bed to chair transfer         Functional mobility during ADLs: Supervision/safety;Min guard General ADL Comments: O2 desaturating during mobility tasks     Vision Baseline Vision/History: Wears glasses Wears Glasses: At all times Patient Visual Report: No change from baseline Vision Assessment?: No apparent visual deficits            Pertinent Vitals/Pain Pain Assessment: No/denies pain     Hand Dominance Right   Extremity/Trunk Assessment Upper Extremity Assessment Upper Extremity Assessment: Overall WFL for tasks assessed   Lower Extremity Assessment Lower Extremity Assessment: Defer to PT evaluation   Cervical / Trunk Assessment Cervical / Trunk Assessment: Normal   Communication Communication Communication: HOH(has hearing aides but they are not charged)   Cognition Arousal/Alertness: Awake/alert Behavior During Therapy: WFL for tasks assessed/performed Overall Cognitive Status: Within Functional Limits for tasks assessed  Home Living Family/patient expects to be discharged to:: Private residence Living Arrangements: Alone Available Help at Discharge: Family;Available PRN/intermittently(family member lives next door)                         Home Equipment: Gilford Rile - 2  wheels;Cane - single point   Additional Comments: Pt unable to give PLOF due to [facility]      Prior Functioning/Environment Level of Independence: Independent with assistive device(s)        Comments: Pt reports he's been using a RW for stability lately. Reports independence in ADLs        OT Problem List: Cardiopulmonary status limiting activity                       Co-evaluation PT/OT/SLP Co-Evaluation/Treatment: Yes Reason for Co-Treatment: Complexity of the patient's impairments (multi-system involvement)   OT goals addressed during session: ADL's and self-care         End of Session Equipment Utilized During Treatment: Oxygen  Activity Tolerance: Patient tolerated treatment well Patient left: in chair;with call bell/phone within reach;with chair alarm set  OT Visit Diagnosis: Muscle weakness (generalized) (M62.81)                Time: [id number] OT Time Calculation (min): 18 min Charges:  OT General Charges $OT Visit: 1 Visit OT Evaluation $OT Eval Low Complexity: Petronila, OTR/L  [phone] 02/28/2020, 9:40 AM

## 2020-02-28 NOTE — ED Notes (Signed)
Report given to Bree RN 

## 2020-02-29 ENCOUNTER — Inpatient Hospital Stay (HOSPITAL_COMMUNITY): Payer: PPO

## 2020-02-29 DIAGNOSIS — I340 Nonrheumatic mitral (valve) insufficiency: Secondary | ICD-10-CM

## 2020-02-29 DIAGNOSIS — I361 Nonrheumatic tricuspid (valve) insufficiency: Secondary | ICD-10-CM

## 2020-02-29 LAB — CBC WITH DIFFERENTIAL/PLATELET
Abs Immature Granulocytes: 0.08 10*3/uL — ABNORMAL HIGH (ref 0.00–0.07)
Basophils Absolute: 0 10*3/uL (ref 0.0–0.1)
Basophils Relative: 0 %
Eosinophils Absolute: 0 10*3/uL (ref 0.0–0.5)
Eosinophils Relative: 0 %
HCT: 48.4 % (ref 39.0–52.0)
Hemoglobin: 16.1 g/dL (ref 13.0–17.0)
Immature Granulocytes: 1 %
Lymphocytes Relative: 6 %
Lymphs Abs: 0.9 10*3/uL (ref 0.7–4.0)
MCH: 31.1 pg (ref 26.0–34.0)
MCHC: 33.3 g/dL (ref 30.0–36.0)
MCV: 93.4 fL (ref 80.0–100.0)
Monocytes Absolute: 0.4 10*3/uL (ref 0.1–1.0)
Monocytes Relative: 3 %
Neutro Abs: 14.6 10*3/uL — ABNORMAL HIGH (ref 1.7–7.7)
Neutrophils Relative %: 90 %
Platelets: 258 10*3/uL (ref 150–400)
RBC: 5.18 MIL/uL (ref 4.22–5.81)
RDW: 13.5 % (ref 11.5–15.5)
WBC: 16.1 10*3/uL — ABNORMAL HIGH (ref 4.0–10.5)
nRBC: 0 % (ref 0.0–0.2)

## 2020-02-29 LAB — GLUCOSE, CAPILLARY
Glucose-Capillary: 316 mg/dL — ABNORMAL HIGH (ref 70–99)
Glucose-Capillary: 179 mg/dL — ABNORMAL HIGH (ref 70–99)

## 2020-02-29 LAB — ECHOCARDIOGRAM COMPLETE
Height: 65 in
Weight: 2282.2 oz

## 2020-02-29 LAB — MAGNESIUM: Magnesium: 2.3 mg/dL (ref 1.7–2.4)

## 2020-02-29 LAB — TSH: TSH: 0.618 u[IU]/mL (ref 0.350–4.500)

## 2020-02-29 MED ORDER — ENSURE ENLIVE PO LIQD: 237.0000 mL | Freq: Two times a day (BID) | ORAL | 12 refills | Status: AC

## 2020-02-29 MED ORDER — INSULIN DETEMIR 100 UNIT/ML ~~LOC~~ SOLN
5.0000 [IU] | Freq: Every day | SUBCUTANEOUS | Status: DC
  Administered 2020-02-29: 5 [IU] via SUBCUTANEOUS
  Filled 2020-02-29 (×2): qty 0.05

## 2020-02-29 MED ORDER — AMOXICILLIN-POT CLAVULANATE 500-125 MG PO TABS: 1.0000 | ORAL_TABLET | Freq: Three times a day (TID) | ORAL | 0 refills | Status: AC

## 2020-02-29 MED ORDER — INSULIN ASPART 100 UNIT/ML ~~LOC~~ SOLN
0.0000 [IU] | SUBCUTANEOUS | Status: DC
  Administered 2020-02-29: 15 [IU] via SUBCUTANEOUS
  Administered 2020-02-29: 4 [IU] via SUBCUTANEOUS

## 2020-02-29 MED ORDER — IPRATROPIUM BROMIDE 0.02 % IN SOLN: 0.5000 mg | Freq: Two times a day (BID) | RESPIRATORY_TRACT | Status: DC

## 2020-02-29 MED ORDER — DILTIAZEM HCL ER COATED BEADS 180 MG PO CP24
180.0000 mg | ORAL_CAPSULE | Freq: Every day | ORAL | Status: DC
  Administered 2020-02-29: 180 mg via ORAL
  Filled 2020-02-29: qty 1

## 2020-02-29 MED ORDER — LEVALBUTEROL HCL 0.63 MG/3ML IN NEBU: 0.6300 mg | INHALATION_SOLUTION | Freq: Two times a day (BID) | RESPIRATORY_TRACT | Status: DC

## 2020-02-29 MED ORDER — DILTIAZEM HCL ER COATED BEADS 180 MG PO CP24: 180.0000 mg | ORAL_CAPSULE | Freq: Every day | ORAL | 3 refills | Status: DC

## 2020-02-29 NOTE — Discharge Summary (Signed)
Physician Discharge Summary  [name] [id number] DOB: [birth year] DOA: 02/27/2020  PCP: Christain Sacramento, MD  Admit date: 02/27/2020  Discharge date: 02/29/2020  Admitted From:Home  Disposition:  Home  Recommendations for Outpatient Follow-up:  1. Follow up with PCP in 1-2 weeks 2. Remain on diltiazem CD 180 mg daily as prescribed and follow-up with Dr. Harl Bowie as scheduled on 03/16/2020 3. Remain on Augmentin as prescribed for 5 more days to complete 7-day course of treatment  Home Health: Yes with PT  Equipment/Devices: Has home oxygen 2 L nasal cannula  Discharge Condition: Stable  CODE STATUS: DNR  Diet recommendation: Heart Healthy  Brief/Interim Summary: Per HPI: [name] is a 86 y.o. male with medical history significant of allergic rhinitis, asthma, BPH, chronic respiratory failure, esophageal reflux, macular degeneration, glaucoma, hypertension, type 2 diabetes who is brought to the emergency department due to dyspnea.  He states that he has been wheezing, coughing with occasional sputum production, fatigue and malaise.  No fever, rhinorrhea or sore throat.  No hemoptysis.  He denies chest pain, palpitations, dizziness, diaphoresis, orthopnea or recent pitting edema of the lower extremities.  His appetite has been decreased, but denies abdominal pain, nausea, vomiting, diarrhea, constipation, melena or hematochezia.  No dysuria, frequency or hematuria.  No polyuria, polydipsia, polyphagia or blurred vision.  Patient was admitted with multifocal atrial tachycardia in the setting of community-acquired pneumonia/COPD.  He has been started on p.o. Cardizem by cardiology with improvement in heart rates noted.  This regimen has been consolidated to Cardizem CD 180 mg daily which he appears to be tolerating well.  He seems to be doing well from a respiratory standpoint and will remain on Augmentin for 5 more days to complete a 7-day course of treatment.  He is on his usual amount  of home oxygen at 2 L and does not appear to be in any further COPD exacerbation.  He was noted to have some mild hyperglycemia secondary to steroids which were administered in the hospital setting.  These have been discontinued and his blood glucose has improved.  He will follow-up with cardiology in the outpatient setting and complete antibiotic treatment.  This was discussed with his son at bedside.  He will have home health physical therapy set up as well prior to discharge.  Discharge Diagnoses:  Principal Problem:   CAP (community acquired pneumonia) Active Problems:   Esophageal reflux   COPD exacerbation ([place])   Essential hypertension   Type 2 diabetes mellitus with hyperglycemia ([place])   Primary open angle glaucoma (POAG) of both eyes, indeterminate stage    Discharge Instructions  Discharge Instructions    Diet - low sodium heart healthy   Complete by: As directed    Increase activity slowly   Complete by: As directed      Allergies as of 02/29/2020      Reactions   Escitalopram Oxalate Palpitations   Tape Rash, Other (See Comments)   Pulls off skin      Medication List    TAKE these medications   acetaminophen 325 MG tablet Commonly known as: TYLENOL Take 650 mg by mouth every 6 (six) hours as needed for mild pain.   Advair Diskus 100-50 MCG/DOSE Aepb Generic drug: Fluticasone-Salmeterol USE 1 PUFF TWICE DAILY. RINSE MOUTH WITH WATER AFTER EACH USE. What changed: See the new instructions.   ALIGN PO Take by mouth. Take 1 capsule three times a week by mouth.   amoxicillin-clavulanate 500-125 MG tablet Commonly  known as: Augmentin Take 1 tablet (500 mg total) by mouth 3 (three) times daily for 5 days.   ascorbic acid 500 MG tablet Commonly known as: VITAMIN C Take 500 mg by mouth daily.   brimonidine 0.2 % ophthalmic solution Commonly known as: ALPHAGAN Place 1 drop into both eyes 2 (two) times daily.   diltiazem 180 MG 24 hr capsule Commonly known as:  CARDIZEM CD Take 1 capsule (180 mg total) by mouth daily. Start taking on: March 01, 2020   dorzolamide 2 % ophthalmic solution Commonly known as: TRUSOPT Place 1 drop into both eyes 3 (three) times daily.   doxazosin 4 MG tablet Commonly known as: CARDURA Take 2 mg by mouth daily.   feeding supplement (ENSURE ENLIVE) Liqd Take 237 mLs by mouth 2 (two) times daily between meals.   finasteride 5 MG tablet Commonly known as: PROSCAR Take 2.5 mg by mouth daily.   gabapentin 100 MG capsule Commonly known as: NEURONTIN Take 100 mg by mouth at bedtime.   ICAPS PO Take 1 tablet by mouth daily.   latanoprost 0.005 % ophthalmic solution Commonly known as: XALATAN Place 1 drop into both eyes at bedtime.   Lumigan 0.01 % Soln Generic drug: bimatoprost Place 1 drop into both eyes at bedtime.   MELATONIN PO Take by mouth. prn   pantoprazole 20 MG tablet Commonly known as: PROTONIX Take 20 mg by mouth every other day.   vitamin E 45 MG (100 UNITS) capsule Take 100 Units by mouth. Take by mouth twice a day      Follow-up Information    [facility] Follow [facility].   Specialty: [place] Why:   PT       Arnoldo Lenis, MD Follow up on 03/16/2020.   Specialty: Cardiology Why: at 3:30 pm with his Nurse Practitioner Cecilie Kicks  Contact information: [address] [place] [address] [phone]        Christain Sacramento, MD Follow up in 1 week(s).   Specialty: Family Medicine Contact information: [address] [address]          Allergies  Allergen Reactions  . Escitalopram Oxalate Palpitations  . Tape Rash and Other (See Comments)    Pulls off skin    Consultations:  Cardiology   Procedures/Studies: CT Angio Chest PE W and/or Wo Contrast  Result Date: 02/27/2020 CLINICAL DATA:  Shortness of breath and low heart rate EXAM: CT ANGIOGRAPHY CHEST WITH CONTRAST TECHNIQUE: Multidetector CT imaging of the  chest was performed using the standard protocol during bolus administration of intravenous contrast. Multiplanar CT image reconstructions and MIPs were obtained to evaluate the vascular anatomy. CONTRAST:  155mL OMNIPAQUE IOHEXOL 350 MG/ML SOLN COMPARISON:  Radiograph same day FINDINGS: Cardiovascular: There is a optimal opacification of the pulmonary arteries. There is no central,segmental, or subsegmental filling defects within the pulmonary arteries. There is prominence of the main pulmonary arteries measuring up to 2.8 cm, likely due to pulmonary arterial hypertension. There is mild cardiomegaly. Coronary artery calcifications are seen. No pericardial effusion or thickening. No evidence right heart strain. There is normal three-vessel brachiocephalic anatomy without proximal stenosis. Scattered aortic atherosclerosis. There is a mild amount of atherosclerosis seen at the origin of the left subclavian artery. Mediastinum/Nodes: No hilar, mediastinal, or axillary adenopathy. Thyroid gland, trachea, and esophagus demonstrate no significant findings. Lungs/Pleura: Extensive centrilobular emphysematous changes with subpleural bleb formation is again identified. There is diffuse areas of ground-glass  opacity and interstitial thickening seen predominantly at both lung bases. No pleural effusion is seen. No pneumothorax. Upper Abdomen: No acute abnormalities present in the visualized portions of the upper abdomen. Musculoskeletal: No chest wall abnormality. No acute or significant osseous findings. Review of the MIP images confirms the above findings. IMPRESSION: Extensive advanced centrilobular emphysematous changes with underlying diffuse ground-glass opacities and interstitial thickening at both lung bases which could be due to concomitant inflammatory/infectious etiology. No central, segmental, or subsegmental pulmonary embolism. Findings suggestive of pulmonary arterial hypertension. Aortic Atherosclerosis  (ICD10-I70.0). Electronically Signed   By: Prudencio Pair [name]   On: 02/27/2020 23:59   DG Chest Port 1 View  Result Date: 02/27/2020 CLINICAL DATA:  Shortness of breath, hypoxia EXAM: PORTABLE CHEST 1 VIEW COMPARISON:  08/01/2019 FINDINGS: Single frontal view of the chest demonstrates extensive scarring and fibrosis unchanged since prior exam. No airspace disease, effusion, or pneumothorax. Cardiac silhouette is stable. No acute bony abnormalities. IMPRESSION: 1. Extensive bilateral scarring and fibrosis. No acute airspace disease. Electronically Signed   By: Randa Ngo [name]   On: 02/27/2020 22:32     Discharge Exam: Vitals:   02/29/20 0733 02/29/20 1036  BP:  112/65  Pulse:  89  Resp:  (!) 22  Temp:    SpO2: 92% 94%   Vitals:   02/29/20 0527 02/29/20 0529 02/29/20 0733 02/29/20 1036  BP: 138/79 138/79  112/65  Pulse:  89  89  Resp:  20  (!) 22  Temp:  97.7 F (36.5 C)    TempSrc:  Oral    SpO2:  90% 92% 94%  Weight:      Height:        General: Pt is alert, awake, not in acute distress, hard of hearing Cardiovascular: RRR, S1/S2 +, no rubs, no gallops Respiratory: CTA bilaterally, no wheezing, no rhonchi, on 2 L nasal cannula oxygen Abdominal: Soft, NT, ND, bowel sounds + Extremities: no edema, no cyanosis    The results of significant diagnostics from this hospitalization (including imaging, microbiology, ancillary and laboratory) are listed below for reference.     Microbiology: Recent Results (from the past 240 hour(s))  Culture, blood (Routine X 2) w Reflex to ID Panel     Status: None (Preliminary result)   Collection Time: 02/28/20 12:57 AM   Specimen: BLOOD  Result Value Ref Range Status   Specimen Description BLOOD LEFT ANTECUBITAL  Final   Special Requests   Final    BOTTLES DRAWN AEROBIC AND ANAEROBIC Blood Culture adequate volume   Culture   Final    NO GROWTH 1 DAY Performed at [facility], [address], [place], [place] [address]    Report  Status PENDING  Incomplete  Culture, blood (Routine X 2) w Reflex to ID Panel     Status: None (Preliminary result)   Collection Time: 02/28/20  1:04 AM   Specimen: BLOOD  Result Value Ref Range Status   Specimen Description BLOOD LEFT ANTECUBITAL  Final   Special Requests   Final    BOTTLES DRAWN AEROBIC AND ANAEROBIC Blood Culture adequate volume   Culture   Final    NO GROWTH 1 DAY Performed at [facility], [address], [place], [place] [address]    Report Status PENDING  Incomplete  SARS CORONAVIRUS 2 (TAT 6-24 HRS) Nasopharyngeal Nasopharyngeal Swab     Status: None   Collection Time: 02/28/20  1:37 AM   Specimen: Nasopharyngeal Swab  Result Value Ref Range Status  SARS Coronavirus 2 NEGATIVE NEGATIVE Final    Comment: (NOTE) SARS-CoV-2 target nucleic acids are NOT DETECTED. The SARS-CoV-2 RNA is generally detectable in upper and lower respiratory specimens during the acute phase of infection. Negative results do not preclude SARS-CoV-2 infection, do not rule out co-infections with other pathogens, and should not be used as the sole basis for treatment or other patient management decisions. Negative results must be combined with clinical observations, patient history, and epidemiological information. The expected result is Negative. Fact Sheet for Patients: SugarRoll.be Fact Sheet for Healthcare Providers: https://www.woods-mathews.com/ This test is not yet approved or cleared by the [place] FDA and  has been authorized for detection and/or diagnosis of SARS-CoV-2 by FDA under an Emergency Use Authorization (EUA). This EUA will remain  in effect (meaning this test can be used) for the duration of the COVID-19 declaration under Section 56 4(b)(1) of the Act, 21 U.S.C. section 360bbb-3(b)(1), unless the authorization is terminated or revoked sooner. Performed at [facility], [place] [address], [place],  [place] [address]      Labs: BNP (last 3 results) Recent Labs    02/27/20 2204  BNP [id number]   Basic Metabolic Panel: Recent Labs  Lab 02/27/20 2204 02/28/20 0614 02/29/20 0537  NA 138 138  --   K 4.2 4.2  --   CL 104 105  --   CO2 25 23  --   GLUCOSE 139* 203*  --   BUN 14 13  --   CREATININE 0.95 0.82  --   CALCIUM 8.8* 8.6*  --   MG  --   --  2.3   Liver Function Tests: Recent Labs  Lab 02/27/20 2204 02/28/20 0614  AST 22 21  ALT 19 19  ALKPHOS 93 91  BILITOT 1.5* 1.3*  PROT 7.6 7.2  ALBUMIN 3.9 3.5   No results for input(s): LIPASE, AMYLASE in the last 168 hours. No results for input(s): AMMONIA in the last 168 hours. CBC: Recent Labs  Lab 02/27/20 2204 02/28/20 0614 02/29/20 0537  WBC 9.6 5.0 16.1*  NEUTROABS 6.8 4.4 14.6*  HGB 16.1 15.6 16.1  HCT 49.5 48.3 48.4  MCV 92.9 92.9 93.4  PLT 231 228 258   Cardiac Enzymes: No results for input(s): CKTOTAL, CKMB, CKMBINDEX, TROPONINI in the last 168 hours. BNP: Invalid input(s): POCBNP CBG: Recent Labs  Lab 02/28/20 1101 02/28/20 1617 02/28/20 2038 02/29/20 0741 02/29/20 1115  GLUCAP 148* 175* 279* 316* 179*   D-Dimer No results for input(s): DDIMER in the last 72 hours. Hgb A1c Recent Labs    02/27/20 2204  HGBA1C 6.8*   Lipid Profile No results for input(s): CHOL, HDL, LDLCALC, TRIG, CHOLHDL, LDLDIRECT in the last 72 hours. Thyroid function studies Recent Labs    02/29/20 0537  TSH 0.618   Anemia work up No results for input(s): VITAMINB12, FOLATE, FERRITIN, TIBC, IRON, RETICCTPCT in the last 72 hours. Urinalysis    Component Value Date/Time   COLORURINE YELLOW 09/12/2014 0021   APPEARANCEUR CLEAR 09/12/2014 0021   LABSPEC 1.007 09/12/2014 0021   PHURINE 7.0 09/12/2014 0021   GLUCOSEU NEGATIVE 09/12/2014 0021   HGBUR NEGATIVE 09/12/2014 0021   BILIRUBINUR NEGATIVE 09/12/2014 0021   KETONESUR NEGATIVE 09/12/2014 0021   PROTEINUR NEGATIVE 09/12/2014 0021   UROBILINOGEN 0.2 09/12/2014  0021   NITRITE NEGATIVE 09/12/2014 0021   LEUKOCYTESUR NEGATIVE 09/12/2014 0021   Sepsis Labs Invalid input(s): PROCALCITONIN,  WBC,  LACTICIDVEN Microbiology Recent Results (from the past 240  hour(s))  Culture, blood (Routine X 2) w Reflex to ID Panel     Status: None (Preliminary result)   Collection Time: 02/28/20 12:57 AM   Specimen: BLOOD  Result Value Ref Range Status   Specimen Description BLOOD LEFT ANTECUBITAL  Final   Special Requests   Final    BOTTLES DRAWN AEROBIC AND ANAEROBIC Blood Culture adequate volume   Culture   Final    NO GROWTH 1 DAY Performed at [facility], [address], [place], [place] [address]    Report Status PENDING  Incomplete  Culture, blood (Routine X 2) w Reflex to ID Panel     Status: None (Preliminary result)   Collection Time: 02/28/20  1:04 AM   Specimen: BLOOD  Result Value Ref Range Status   Specimen Description BLOOD LEFT ANTECUBITAL  Final   Special Requests   Final    BOTTLES DRAWN AEROBIC AND ANAEROBIC Blood Culture adequate volume   Culture   Final    NO GROWTH 1 DAY Performed at [facility], [address] [place], [place] [address]    Report Status PENDING  Incomplete  SARS CORONAVIRUS 2 (TAT 6-24 HRS) Nasopharyngeal Nasopharyngeal Swab     Status: None   Collection Time: 02/28/20  1:37 AM   Specimen: Nasopharyngeal Swab  Result Value Ref Range Status   SARS Coronavirus 2 NEGATIVE NEGATIVE Final    Comment: (NOTE) SARS-CoV-2 target nucleic acids are NOT DETECTED. The SARS-CoV-2 RNA is generally detectable in upper and lower respiratory specimens during the acute phase of infection. Negative results do not preclude SARS-CoV-2 infection, do not rule out co-infections with other pathogens, and should not be used as the sole basis for treatment or other patient management decisions. Negative results must be combined with clinical observations, patient history, and epidemiological information. The expected result is  Negative. Fact Sheet for Patients: SugarRoll.be Fact Sheet for Healthcare Providers: https://www.woods-mathews.com/ This test is not yet approved or cleared by the [place] FDA and  has been authorized for detection and/or diagnosis of SARS-CoV-2 by FDA under an Emergency Use Authorization (EUA). This EUA will remain  in effect (meaning this test can be used) for the duration of the COVID-19 declaration under Section 56 4(b)(1) of the Act, 21 U.S.C. section 360bbb-3(b)(1), unless the authorization is terminated or revoked sooner. Performed at [facility], [place] [address] [place], [place] [address]      Time coordinating discharge: 35 minutes  SIGNED:   Rodena Goldmann, DO [facility] 02/29/2020, 11:49 AM  If 7PM-7AM, please contact night-coverage [email or web address]

## 2020-02-29 NOTE — Progress Notes (Signed)
Pt discharged via wheelchair to POV. Home O2 tank in use at discharge. All belongings with pt at discharge.

## 2020-02-29 NOTE — Progress Notes (Signed)
Pt ambulated with standby assist x1 to bathroom. Voided small amount of urine and passed a lot of flatus but no BM. Pt back to bed, SOB with exertion. Denies c/o chest pain or dizziness. Resp 24/min just after ambulating, down to 18-20/min after 20 minutes of rest. Other VSS.

## 2020-02-29 NOTE — TOC Transition Note (Signed)
Transition of Care [facility]) - CM/SW Discharge Note   Patient Details  Name: TALOR CONWILL MRN: [id number] Date of Birth: 27-Jun-1927  Transition of Care [facility]) CM/SW Contact:  Shade Flood, LCSW Phone Number: 02/29/2020, 11:39 AM   Clinical Narrative:     Pt stable for dc today per MD. Plan remains for return home with [facility]. Notified Linda from [facility] of pt's dc. They will follow up with pt at home.  There are no other TOC needs identified for dc.  Final next level of care: [place] Barriers to Discharge: Barriers Resolved   Patient Goals and CMS Choice Patient states their goals for this hospitalization and ongoing recovery are:: to go home. CMS Medicare.gov Compare Post Acute Care list provided to:: Patient Choice offered to / list presented to : Patient  Discharge Placement                       Discharge Plan and Services     Post Acute Care Choice: [place]: PT [facility] Agency: [place] ([place]) Date [place]: 02/28/20 Time [place]: 1200 Representative spoke with at [place]: [place] ([place]) Interventions     Readmission Risk Interventions Readmission Risk Prevention Plan 02/29/2020  Medication Screening Complete  Transportation Screening Complete  Some recent data might be hidden

## 2020-02-29 NOTE — Progress Notes (Signed)
*  PRELIMINARY RESULTS* Echocardiogram 2D Echocardiogram has been performed.  Samuel Germany 02/29/2020, 12:37 PM

## 2020-02-29 NOTE — Progress Notes (Signed)
Progress Note  Patient Name: CORTLAND SEME Date of Encounter: 02/29/2020  Primary Cardiologist: New, Dr Harl Bowie  Subjective   Breathing is improving.   Inpatient Medications    Scheduled Meds: . brimonidine  1 drop Both Eyes BID  . diltiazem  30 mg Oral Q6H  . dorzolamide  1 drop Both Eyes TID  . doxazosin  2 mg Oral Daily  . enoxaparin (LOVENOX) injection  40 mg Subcutaneous Q24H  . feeding supplement (ENSURE ENLIVE)  237 mL Oral BID BM  . finasteride  2.5 mg Oral Daily  . gabapentin  100 mg Oral QHS  . insulin aspart  0-20 Units Subcutaneous Q4H  . insulin detemir  5 Units Subcutaneous Daily  . ipratropium  0.5 mg Nebulization BID  . latanoprost  1 drop Both Eyes QHS  . levalbuterol  0.63 mg Nebulization BID  . melatonin  6 mg Oral QHS   Continuous Infusions: . azithromycin Stopped (02/29/20 0250)  . cefTRIAXone (ROCEPHIN)  IV Stopped (02/28/20 2303)   PRN Meds: acetaminophen **OR** acetaminophen, levalbuterol   Vital Signs    Vitals:   02/28/20 2102 02/29/20 0527 02/29/20 0529 02/29/20 0733  BP: 122/61 138/79 138/79   Pulse: 79  89   Resp: 20  20   Temp: 97.6 F (36.4 C)  97.7 F (36.5 C)   TempSrc: Oral  Oral   SpO2: 91%  90% 92%  Weight:      Height:        Intake/Output Summary (Last 24 hours) at 02/29/2020 1000 Last data filed at 02/29/2020 0530 Gross per 24 hour  Intake 1110.51 ml  Output 550 ml  Net 560.51 ml   Last 3 Weights 02/28/2020 02/27/2020 08/01/2019  Weight (lbs) 142 lb 10.2 oz 144 lb 139 lb 12.8 oz  Weight (kg) 64.7 kg 65.318 kg 63.413 kg      Telemetry    SR, PACs, short runs of MAT - Personally Reviewed  ECG    n/a - Personally Reviewed  Physical Exam   GEN: No acute distress.   Neck: No JVD Cardiac: RRR, no murmurs, rubs, or gallops.  Respiratory: Clear to auscultation bilaterally. GI: Soft, nontender, non-distended  MS: No edema; No deformity. Neuro:  Nonfocal  Psych: Normal affect   Labs    High Sensitivity  Troponin:   Recent Labs  Lab 02/27/20 2204 02/28/20 0005  TROPONINIHS 10 9      Chemistry Recent Labs  Lab 02/27/20 2204 02/28/20 0614  NA 138 138  K 4.2 4.2  CL 104 105  CO2 25 23  GLUCOSE 139* 203*  BUN 14 13  CREATININE 0.95 0.82  CALCIUM 8.8* 8.6*  PROT 7.6 7.2  ALBUMIN 3.9 3.5  AST 22 21  ALT 19 19  ALKPHOS 93 91  BILITOT 1.5* 1.3*  GFRNONAA >60 >60  GFRAA >60 >60  ANIONGAP 9 10     Hematology Recent Labs  Lab 02/27/20 2204 02/28/20 0614 02/29/20 0537  WBC 9.6 5.0 16.1*  RBC 5.33 5.20 5.18  HGB 16.1 15.6 16.1  HCT 49.5 48.3 48.4  MCV 92.9 92.9 93.4  MCH 30.2 30.0 31.1  MCHC 32.5 32.3 33.3  RDW 13.2 13.2 13.5  PLT 231 228 258    BNP Recent Labs  Lab 02/27/20 2204  BNP 84.0     DDimer No results for input(s): DDIMER in the last 168 hours.   Radiology    CT Angio Chest PE W and/or Wo Contrast  Result Date:  02/27/2020 CLINICAL DATA:  Shortness of breath and low heart rate EXAM: CT ANGIOGRAPHY CHEST WITH CONTRAST TECHNIQUE: Multidetector CT imaging of the chest was performed using the standard protocol during bolus administration of intravenous contrast. Multiplanar CT image reconstructions and MIPs were obtained to evaluate the vascular anatomy. CONTRAST:  186mL OMNIPAQUE IOHEXOL 350 MG/ML SOLN COMPARISON:  Radiograph same day FINDINGS: Cardiovascular: There is a optimal opacification of the pulmonary arteries. There is no central,segmental, or subsegmental filling defects within the pulmonary arteries. There is prominence of the main pulmonary arteries measuring up to 2.8 cm, likely due to pulmonary arterial hypertension. There is mild cardiomegaly. Coronary artery calcifications are seen. No pericardial effusion or thickening. No evidence right heart strain. There is normal three-vessel brachiocephalic anatomy without proximal stenosis. Scattered aortic atherosclerosis. There is a mild amount of atherosclerosis seen at the origin of the left subclavian  artery. Mediastinum/Nodes: No hilar, mediastinal, or axillary adenopathy. Thyroid gland, trachea, and esophagus demonstrate no significant findings. Lungs/Pleura: Extensive centrilobular emphysematous changes with subpleural bleb formation is again identified. There is diffuse areas of ground-glass opacity and interstitial thickening seen predominantly at both lung bases. No pleural effusion is seen. No pneumothorax. Upper Abdomen: No acute abnormalities present in the visualized portions of the upper abdomen. Musculoskeletal: No chest wall abnormality. No acute or significant osseous findings. Review of the MIP images confirms the above findings. IMPRESSION: Extensive advanced centrilobular emphysematous changes with underlying diffuse ground-glass opacities and interstitial thickening at both lung bases which could be due to concomitant inflammatory/infectious etiology. No central, segmental, or subsegmental pulmonary embolism. Findings suggestive of pulmonary arterial hypertension. Aortic Atherosclerosis (ICD10-I70.0). Electronically Signed   By: Prudencio Pair [name]   On: 02/27/2020 23:59   DG Chest Port 1 View  Result Date: 02/27/2020 CLINICAL DATA:  Shortness of breath, hypoxia EXAM: PORTABLE CHEST 1 VIEW COMPARISON:  08/01/2019 FINDINGS: Single frontal view of the chest demonstrates extensive scarring and fibrosis unchanged since prior exam. No airspace disease, effusion, or pneumothorax. Cardiac silhouette is stable. No acute bony abnormalities. IMPRESSION: 1. Extensive bilateral scarring and fibrosis. No acute airspace disease. Electronically Signed   By: Randa Ngo [name]   On: 02/27/2020 22:32    Cardiac Studies     Patient Profile  NADEN DOOLEN is a 86 y.o. male with a hx of COPD, on home 02, skin cancers, borderline diabetic, HTN, remote tobacco use who is being seen today for the evaluation of tachycardia at the request of Dr. Wynetta Emery.   Assessment & Plan    1. MAT - in setting of  chronic lung disease and current pneumonia - have not seen any afib or aflutter to warrant anticoag - started on diltiazem 30mg  every 6 hours. BP is tolerating, HRs 80s to low 100s, occasional spikes at times. Consolidated to long acting dilt 180 mg.  - as pneumonia resolves drive for tachycardia should also improve.  - f/u echo  2. Pneumonia - abx per primary team   [facility] for discharge from cardiac standpoint after echo, we will arrange outpatient f/u. Call us back if needed.   For questions or updates, please contact [place] Please consult [email or web address] for contact info under        Signed, Carlyle Dolly, MD  02/29/2020, 10:00 AM

## 2020-03-01 ENCOUNTER — Ambulatory Visit: Payer: PPO | Admitting: Adult Health

## 2020-03-02 ENCOUNTER — Encounter: Payer: Self-pay | Admitting: Primary Care

## 2020-03-02 ENCOUNTER — Other Ambulatory Visit: Payer: Self-pay

## 2020-03-02 ENCOUNTER — Ambulatory Visit: Payer: PPO | Admitting: Primary Care

## 2020-03-02 VITALS — BP 140/60 | HR 89 | Temp 97.6°F | Ht 66.0 in | Wt 145.0 lb

## 2020-03-02 DIAGNOSIS — J449 Chronic obstructive pulmonary disease, unspecified: Secondary | ICD-10-CM | POA: Diagnosis not present

## 2020-03-02 DIAGNOSIS — J9611 Chronic respiratory failure with hypoxia: Secondary | ICD-10-CM

## 2020-03-02 DIAGNOSIS — J189 Pneumonia, unspecified organism: Secondary | ICD-10-CM

## 2020-03-02 MED ORDER — GUAIFENESIN 100 MG/5ML PO SOLN: 5.0000 mL | ORAL | 0 refills | Status: AC | PRN

## 2020-03-02 MED ORDER — LEVALBUTEROL HCL 0.63 MG/3ML IN NEBU: 0.6300 mg | INHALATION_SOLUTION | Freq: Four times a day (QID) | RESPIRATORY_TRACT | 6 refills | Status: DC | PRN

## 2020-03-02 NOTE — Assessment & Plan Note (Signed)
-   Hospitalized 3/22-24, completed 7 day course Augmentin - Recommend guaifenesin 100mg /52ml q4 hours prn congestion - Repeat CXR in 2 weeks

## 2020-03-02 NOTE — Assessment & Plan Note (Signed)
-   TRIALTrelegy 100  - Add xopenex nebulizer q 6 hours prn sob - Other option is changing maintenance inhaler to nebulized pulmicort/brovana  - FU in 2 weeks

## 2020-03-02 NOTE — Progress Notes (Signed)
@Patient  ID: [name], male    DOB: November 28, 1927, 86 y.o.   MRN: [id number]  Chief Complaint  Patient presents with   Hospitalization Follow-up    Referring provider: Christain [place], MD  HPI: 86 year old male, former smoker quit 1993 (100 pack year hx). PMH significant for asthma/COPD, interstitial lung disease (UIP), chronic respiratory failure with hypoxia (oxygen dependent), allergic rhinitis, esophageal reflux, HTN, type 2 diabetes. Patient of Dr. Annamaria Boots, last seen 12/15/19 for virtual telephone visit. Maintained on Advair 250 and 2L oxygen.   Recently admitted to the hospital for community acquired pneumonia from 3/22-3/24, treated with 7 day course of oral Augmentin for 7 days. He was also started on cardizem by cardiology for multifocal atrial tachycardia. He did not require additional oxygen.   03/02/2020 Patient presents today for hospital follow-up. Accompanied by his daughter. He is doing ok, all things considered. He recently lost his wife earlier this month and his health has declined overall the past few weeks. His breathing is some worse. His appetite is ok, he is weak. He is in a wheelchair which is new for him. He has an occasional cough. He is currently on 3L oxygen sating 90%. He was previously only using Advair inhaler once daily to save money. Now needing to use it twice daily. Daughter feels he doesn't have the strength to use it properly. Patient did well with one of the nebulizer's that was given to him in the hospital, daughter states albuterol causes him to have a reaction. They are looking into getting a continuous portable oxygen concentrator. DME company is [employer], however, they are unhappy with them. He had some surgical procedures to his ears, the loops on face mask and oxygen tubing bother him so his daughter is looking into alternative oxygen delivery options.  Allergies  Allergen Reactions   Escitalopram Oxalate Palpitations   Tape Rash and Other (See Comments)     Pulls off skin    Immunization History  Administered Date(s) Administered   Fluad Quad(high Dose 65+) 08/01/2019   H1N1 01/11/2009   Influenza Split 09/08/2011, 08/08/2012, 09/06/2013, 10/19/2014   Influenza, High Dose Seasonal PF 09/10/2016, 08/20/2017, 09/02/2018   Influenza,inj,Quad PF,6+ Mos 09/17/2015   Influenza-Unspecified 09/17/2015, 09/10/2016   Pneumococcal Conjugate-13 04/06/2014, 09/10/2016   Pneumococcal Polysaccharide-23 04/06/2014, 09/10/2016   Tdap 06/29/2019, 06/30/2019   Zoster Recombinat (Shingrix) 02/18/2018, 02/22/2018, 05/11/2018, 05/26/2018    Past Medical History:  Diagnosis Date   Allergic rhinitis, cause unspecified    Asthma    BPH (benign prostatic hyperplasia)    Chronic airway obstruction, not elsewhere classified    Esophageal reflux    Macular degeneration     Tobacco History: Social History   Tobacco Use  Smoking Status Former Smoker   Packs/day: 2.00   Years: 50.00   Pack years: 100.00   Types: Cigarettes   Quit date: 12/09/1991   Years since quitting: 28.2  Smokeless Tobacco Never Used   Counseling given: Not Answered   Outpatient Medications Prior to Visit  Medication Sig Dispense Refill   acetaminophen (TYLENOL) 325 MG tablet Take 650 mg by mouth every 6 (six) hours as needed for mild pain.      ADVAIR DISKUS 100-50 MCG/DOSE AEPB USE 1 PUFF TWICE DAILY. RINSE MOUTH WITH WATER AFTER EACH USE. 60 each 3   amoxicillin-clavulanate (AUGMENTIN) 500-125 MG tablet Take 1 tablet (500 mg total) by mouth 3 (three) times daily for 5 days. 15 tablet 0   ascorbic acid (VITAMIN  C) 500 MG tablet Take 500 mg by mouth daily.      bimatoprost (LUMIGAN) 0.01 % SOLN Place 1 drop into both eyes at bedtime.      brimonidine (ALPHAGAN) 0.2 % ophthalmic solution Place 1 drop into both eyes 2 (two) times daily.     diltiazem (CARDIZEM CD) 180 MG 24 hr capsule Take 1 capsule (180 mg total) by mouth daily. 30 capsule 3    dorzolamide (TRUSOPT) 2 % ophthalmic solution Place 1 drop into both eyes 3 (three) times daily.      doxazosin (CARDURA) 4 MG tablet Take 2 mg by mouth daily.     feeding supplement, ENSURE ENLIVE, (ENSURE ENLIVE) LIQD Take 237 mLs by mouth 2 (two) times daily between meals. 237 mL 12   finasteride (PROSCAR) 5 MG tablet Take 2.5 mg by mouth daily.      gabapentin (NEURONTIN) 100 MG capsule Take 100 mg by mouth at bedtime.     latanoprost (XALATAN) 0.005 % ophthalmic solution Place 1 drop into both eyes at bedtime.      MELATONIN PO Take by mouth. prn     Multiple Vitamins-Minerals (ICAPS PO) Take 1 tablet by mouth daily.      pantoprazole (PROTONIX) 20 MG tablet Take 20 mg by mouth every other day.     Probiotic Product (ALIGN PO) Take by mouth. Take 1 capsule three times a week by mouth.     vitamin E 100 UNIT capsule Take 100 Units by mouth. Take by mouth twice a day     No facility-administered medications prior to visit.    Review of Systems  Review of Systems  Respiratory: Positive for cough. Negative for wheezing.    Physical Exam  BP 140/60 (BP Location: Left Arm, Cuff Size: Normal)    Pulse 89    Temp 97.6 F (36.4 C) (Temporal)    Ht 5\' 6"  (1.676 m)    Wt 145 lb (65.8 kg)    SpO2 90%    BMI 23.40 kg/m  Physical Exam Constitutional:      General: He is not in acute distress.    Appearance: He is ill-appearing.  HENT:     Head: Normocephalic and atraumatic.     Mouth/Throat:     Comments: Deferred d/t masking Cardiovascular:     Rate and Rhythm: Normal rate.  Pulmonary:     Effort: Pulmonary effort is normal.     Breath sounds: No stridor. Rales present. No wheezing or rhonchi.  Musculoskeletal:        General: No swelling.     Right lower leg: No edema.     Left lower leg: No edema.     Comments: In WC  Skin:    General: Skin is dry.     Comments: Thin/Scattered bruising/scabing   Neurological:     General: No focal deficit present.     Mental  Status: He is alert.     Motor: Weakness present.  Psychiatric:        Behavior: Behavior normal.      Lab Results:  CBC    Component Value Date/Time   WBC 16.1 (H) 02/29/2020 0537   RBC 5.18 02/29/2020 0537   HGB 16.1 02/29/2020 0537   HCT 48.4 02/29/2020 0537   PLT 258 02/29/2020 0537   MCV 93.4 02/29/2020 0537   MCH 31.1 02/29/2020 0537   MCHC 33.3 02/29/2020 0537   RDW 13.5 02/29/2020 0537   LYMPHSABS 0.9 02/29/2020 0537  MONOABS 0.4 02/29/2020 0537   EOSABS 0.0 02/29/2020 0537   BASOSABS 0.0 02/29/2020 0537    BMET    Component Value Date/Time   NA 138 02/28/2020 0614   K 4.2 02/28/2020 0614   CL 105 02/28/2020 0614   CO2 23 02/28/2020 0614   GLUCOSE 203 (H) 02/28/2020 0614   BUN 13 02/28/2020 0614   CREATININE 0.82 02/28/2020 0614   CALCIUM 8.6 (L) 02/28/2020 0614   GFRNONAA >60 02/28/2020 0614   GFRAA >60 02/28/2020 0614    BNP    Component Value Date/Time   BNP 84.0 02/27/2020 2204    ProBNP    Component Value Date/Time   PROBNP 222.2 03/12/2014 1938    Imaging: CT Angio Chest PE W and/or Wo Contrast  Result Date: 02/27/2020 CLINICAL DATA:  Shortness of breath and low heart rate EXAM: CT ANGIOGRAPHY CHEST WITH CONTRAST TECHNIQUE: Multidetector CT imaging of the chest was performed using the standard protocol during bolus administration of intravenous contrast. Multiplanar CT image reconstructions and MIPs were obtained to evaluate the vascular anatomy. CONTRAST:  147mL OMNIPAQUE IOHEXOL 350 MG/ML SOLN COMPARISON:  Radiograph same day FINDINGS: Cardiovascular: There is a optimal opacification of the pulmonary arteries. There is no central,segmental, or subsegmental filling defects within the pulmonary arteries. There is prominence of the main pulmonary arteries measuring up to 2.8 cm, likely due to pulmonary arterial hypertension. There is mild cardiomegaly. Coronary artery calcifications are seen. No pericardial effusion or thickening. No evidence  right heart strain. There is normal three-vessel brachiocephalic anatomy without proximal stenosis. Scattered aortic atherosclerosis. There is a mild amount of atherosclerosis seen at the origin of the left subclavian artery. Mediastinum/Nodes: No hilar, mediastinal, or axillary adenopathy. Thyroid gland, trachea, and esophagus demonstrate no significant findings. Lungs/Pleura: Extensive centrilobular emphysematous changes with subpleural bleb formation is again identified. There is diffuse areas of ground-glass opacity and interstitial thickening seen predominantly at both lung bases. No pleural effusion is seen. No pneumothorax. Upper Abdomen: No acute abnormalities present in the visualized portions of the upper abdomen. Musculoskeletal: No chest wall abnormality. No acute or significant osseous findings. Review of the MIP images confirms the above findings. IMPRESSION: Extensive advanced centrilobular emphysematous changes with underlying diffuse ground-glass opacities and interstitial thickening at both lung bases which could be due to concomitant inflammatory/infectious etiology. No central, segmental, or subsegmental pulmonary embolism. Findings suggestive of pulmonary arterial hypertension. Aortic Atherosclerosis (ICD10-I70.0). Electronically Signed   By: Prudencio Pair [name]   On: 02/27/2020 23:59   DG Chest Port 1 View  Result Date: 02/27/2020 CLINICAL DATA:  Shortness of breath, hypoxia EXAM: PORTABLE CHEST 1 VIEW COMPARISON:  08/01/2019 FINDINGS: Single frontal view of the chest demonstrates extensive scarring and fibrosis unchanged since prior exam. No airspace disease, effusion, or pneumothorax. Cardiac silhouette is stable. No acute bony abnormalities. IMPRESSION: 1. Extensive bilateral scarring and fibrosis. No acute airspace disease. Electronically Signed   By: Randa Ngo [name]   On: 02/27/2020 22:32   ECHOCARDIOGRAM COMPLETE  Result Date: 02/29/2020    ECHOCARDIOGRAM REPORT   Patient Name:    [name] Date of Exam: 02/29/2020 Medical Rec #:  [id number]     Height:       65.0 in Accession #:    [id number]    Weight:       142.6 lb Date of Birth:  23-Jul-1927      BSA:          1.713 m Patient Age:    60  years      BP:           112/65 mmHg Patient Gender: M             HR:           89 bpm. Exam Location:  [facility] Procedure: 2D Echo, Cardiac Doppler and Color Doppler Indications:    Abnormal ECG 794.31 / R94.31  History:        Patient has no prior history of Echocardiogram examinations.                 COPD; Risk Factors:Hypertension and Diabetes.  Sonographer:    Alvino Chapel RCS Referring Phys: [place]  1. Left ventricular ejection fraction, by estimation, is 70 to 75%. The left ventricle has hyperdynamic function. The left ventricle has no regional wall motion abnormalities. Left ventricular diastolic parameters are indeterminate.  2. Right ventricular systolic function is normal. The right ventricular size is normal. There is mildly elevated pulmonary artery systolic pressure.  3. Left atrial size was mildly dilated.  4. The mitral valve is abnormal. Mild mitral valve regurgitation. No evidence of mitral stenosis.  5. The aortic valve is tricuspid. Aortic valve regurgitation is not visualized. No aortic stenosis is present.  6. The inferior vena cava is normal in size with greater than 50% respiratory variability, suggesting right atrial pressure of 3 mmHg. FINDINGS  Left Ventricle: Left ventricular ejection fraction, by estimation, is 70 to 75%. The left ventricle has hyperdynamic function. The left ventricle has no regional wall motion abnormalities. The left ventricular internal cavity size was normal in size. There is no left ventricular hypertrophy. Left ventricular diastolic parameters are indeterminate. Right Ventricle: The right ventricular size is normal. No increase in right ventricular wall thickness. Right ventricular systolic function is normal. There is  mildly elevated pulmonary artery systolic pressure. The tricuspid regurgitant velocity is 2.67  m/s, and with an assumed right atrial pressure of 8 mmHg, the estimated right ventricular systolic pressure is [id number] mmHg. Left Atrium: Left atrial size was mildly dilated. Right Atrium: Right atrial size was normal in size. Pericardium: There is no evidence of pericardial effusion. Mitral Valve: The mitral valve is abnormal. There is mild thickening of the mitral valve leaflet(s). There is mild calcification of the mitral valve leaflet(s). Mild mitral annular calcification. Mild mitral valve regurgitation. No evidence of mitral valve stenosis. Tricuspid Valve: The tricuspid valve is normal in structure. Tricuspid valve regurgitation is mild . No evidence of tricuspid stenosis. Aortic Valve: The aortic valve is tricuspid. . There is mild thickening and mild calcification of the aortic valve. Aortic valve regurgitation is not visualized. No aortic stenosis is present. Mild aortic valve annular calcification. There is mild thickening of the aortic valve. There is mild calcification of the aortic valve. Aortic valve mean gradient measures 6.8 mmHg. Aortic valve peak gradient measures 13.6 mmHg. Aortic valve area, by VTI measures 2.03 cm. Pulmonic Valve: The pulmonic valve was not well visualized. Pulmonic valve regurgitation is not visualized. No evidence of pulmonic stenosis. Aorta: The aortic root is normal in size and structure. Pulmonary Artery: No significant pulmonary HTN, PASP is 30 mmHg. Venous: The inferior vena cava is normal in size with greater than 50% respiratory variability, suggesting right atrial pressure of 3 mmHg. IAS/Shunts: No atrial level shunt detected by color flow Doppler.  LEFT VENTRICLE PLAX 2D LVIDd:         3.23 cm  Diastology LVIDs:  2.04 cm  LV e' lateral:   5.11 cm/s LV PW:         0.98 cm  LV E/e' lateral: 21.1 LV IVS:        1.00 cm  LV e' medial:    7.40 cm/s LVOT diam:     1.90 cm   LV E/e' medial:  14.6 LV SV:         78 LV SV Index:   45 LVOT Area:     2.84 cm  RIGHT VENTRICLE RV Mid diam:    3.20 cm TAPSE (M-mode): 2.2 cm LEFT ATRIUM           Index LA diam:      3.20 cm 1.87 cm/m LA Vol (A4C): 50.4 ml 29.41 ml/m  AORTIC VALVE AV Area (Vmax):    2.10 cm AV Area (Vmean):   2.47 cm AV Area (VTI):     2.03 cm AV Vmax:           184.61 cm/s AV Vmean:          119.524 cm/s AV VTI:            0.383 m AV Peak Grad:      13.6 mmHg AV Mean Grad:      6.8 mmHg LVOT Vmax:         137.00 cm/s LVOT Vmean:        104.000 cm/s LVOT VTI:          0.274 m LVOT/AV VTI ratio: 0.72  AORTA Ao Root diam: 3.50 cm MITRAL VALVE                TRICUSPID VALVE MV Area (PHT): 3.50 cm     TR Peak grad:   28.5 mmHg MV Decel Time: 217 msec     TR Vmax:        267.00 cm/s MV E velocity: 108.00 cm/s MV A velocity: 105.00 cm/s  SHUNTS MV E/A ratio:  1.03         Systemic VTI:  0.27 m                             Systemic Diam: 1.90 cm Carlyle Dolly MD Electronically signed by Carlyle Dolly MD Signature Date/Time: 02/29/2020/2:54:59 PM    Final      Assessment & Plan:   CAP (community acquired pneumonia) - Hospitalized 3/22-24, completed 7 day course Augmentin - Recommend guaifenesin 100mg /28ml q4 hours prn congestion - Repeat CXR in 2 weeks   COPD mixed type (HCC) - TRIALTrelegy 100  - Add xopenex nebulizer q 6 hours prn sob - Other option is changing maintenance inhaler to nebulized pulmicort/brovana  - FU in 2 weeks   Chronic respiratory failure with hypoxia ([place]) - Titrate 2-4L to keep oxygen >88% - Patient's family looking into getting Inogen continuous POC   Martyn Ehrich, NP 03/02/2020

## 2020-03-02 NOTE — Patient Instructions (Signed)
Pleasure meeting you today [name]  Recommendations: Start Trelegy- take one puff daily in the morning Stop Advair Use nebulizer every 6 hours as needed for shortness of breath or wheezing   Oxygen: Look into Inogen/continuous portable oxygen concentrator Titrate O2 2-4L to keep > 88%  Referral: New nebulizer machine- [employer]    Orders: CXR in 2 weeks   Follow-up: 2 weeks with Beth, please come 20 min early and get CXR

## 2020-03-02 NOTE — Assessment & Plan Note (Signed)
-   Titrate 2-4L to keep oxygen >88% - Patient's family looking into getting Inogen continuous POC

## 2020-03-03 DIAGNOSIS — J440 Chronic obstructive pulmonary disease with acute lower respiratory infection: Secondary | ICD-10-CM | POA: Diagnosis not present

## 2020-03-03 DIAGNOSIS — E1165 Type 2 diabetes mellitus with hyperglycemia: Secondary | ICD-10-CM | POA: Diagnosis not present

## 2020-03-03 DIAGNOSIS — I10 Essential (primary) hypertension: Secondary | ICD-10-CM | POA: Diagnosis not present

## 2020-03-03 DIAGNOSIS — H401124 Primary open-angle glaucoma, left eye, indeterminate stage: Secondary | ICD-10-CM | POA: Diagnosis not present

## 2020-03-03 DIAGNOSIS — J441 Chronic obstructive pulmonary disease with (acute) exacerbation: Secondary | ICD-10-CM | POA: Diagnosis not present

## 2020-03-03 DIAGNOSIS — N400 Enlarged prostate without lower urinary tract symptoms: Secondary | ICD-10-CM | POA: Diagnosis not present

## 2020-03-03 DIAGNOSIS — J189 Pneumonia, unspecified organism: Secondary | ICD-10-CM | POA: Diagnosis not present

## 2020-03-03 DIAGNOSIS — H401114 Primary open-angle glaucoma, right eye, indeterminate stage: Secondary | ICD-10-CM | POA: Diagnosis not present

## 2020-03-03 DIAGNOSIS — H3530 Unspecified macular degeneration: Secondary | ICD-10-CM | POA: Diagnosis not present

## 2020-03-03 DIAGNOSIS — J309 Allergic rhinitis, unspecified: Secondary | ICD-10-CM | POA: Diagnosis not present

## 2020-03-03 DIAGNOSIS — Z9981 Dependence on supplemental oxygen: Secondary | ICD-10-CM | POA: Diagnosis not present

## 2020-03-04 LAB — CULTURE, BLOOD (ROUTINE X 2)
Culture: NO GROWTH
Culture: NO GROWTH
Special Requests: ADEQUATE
Special Requests: ADEQUATE

## 2020-03-05 DIAGNOSIS — J432 Centrilobular emphysema: Secondary | ICD-10-CM | POA: Diagnosis not present

## 2020-03-05 DIAGNOSIS — J849 Interstitial pulmonary disease, unspecified: Secondary | ICD-10-CM | POA: Diagnosis not present

## 2020-03-05 DIAGNOSIS — J438 Other emphysema: Secondary | ICD-10-CM | POA: Diagnosis not present

## 2020-03-05 DIAGNOSIS — J449 Chronic obstructive pulmonary disease, unspecified: Secondary | ICD-10-CM | POA: Diagnosis not present

## 2020-03-08 DIAGNOSIS — E1165 Type 2 diabetes mellitus with hyperglycemia: Secondary | ICD-10-CM | POA: Diagnosis not present

## 2020-03-08 DIAGNOSIS — N400 Enlarged prostate without lower urinary tract symptoms: Secondary | ICD-10-CM | POA: Diagnosis not present

## 2020-03-08 DIAGNOSIS — H401124 Primary open-angle glaucoma, left eye, indeterminate stage: Secondary | ICD-10-CM | POA: Diagnosis not present

## 2020-03-08 DIAGNOSIS — J189 Pneumonia, unspecified organism: Secondary | ICD-10-CM | POA: Diagnosis not present

## 2020-03-08 DIAGNOSIS — J440 Chronic obstructive pulmonary disease with acute lower respiratory infection: Secondary | ICD-10-CM | POA: Diagnosis not present

## 2020-03-08 DIAGNOSIS — H401114 Primary open-angle glaucoma, right eye, indeterminate stage: Secondary | ICD-10-CM | POA: Diagnosis not present

## 2020-03-08 DIAGNOSIS — I10 Essential (primary) hypertension: Secondary | ICD-10-CM | POA: Diagnosis not present

## 2020-03-08 DIAGNOSIS — Z9981 Dependence on supplemental oxygen: Secondary | ICD-10-CM | POA: Diagnosis not present

## 2020-03-08 DIAGNOSIS — H3530 Unspecified macular degeneration: Secondary | ICD-10-CM | POA: Diagnosis not present

## 2020-03-08 DIAGNOSIS — J441 Chronic obstructive pulmonary disease with (acute) exacerbation: Secondary | ICD-10-CM | POA: Diagnosis not present

## 2020-03-08 DIAGNOSIS — J309 Allergic rhinitis, unspecified: Secondary | ICD-10-CM | POA: Diagnosis not present

## 2020-03-13 DIAGNOSIS — J189 Pneumonia, unspecified organism: Secondary | ICD-10-CM | POA: Diagnosis not present

## 2020-03-13 DIAGNOSIS — J440 Chronic obstructive pulmonary disease with acute lower respiratory infection: Secondary | ICD-10-CM | POA: Diagnosis not present

## 2020-03-13 DIAGNOSIS — N400 Enlarged prostate without lower urinary tract symptoms: Secondary | ICD-10-CM | POA: Diagnosis not present

## 2020-03-13 DIAGNOSIS — H401124 Primary open-angle glaucoma, left eye, indeterminate stage: Secondary | ICD-10-CM | POA: Diagnosis not present

## 2020-03-13 DIAGNOSIS — J441 Chronic obstructive pulmonary disease with (acute) exacerbation: Secondary | ICD-10-CM | POA: Diagnosis not present

## 2020-03-13 DIAGNOSIS — J309 Allergic rhinitis, unspecified: Secondary | ICD-10-CM | POA: Diagnosis not present

## 2020-03-13 DIAGNOSIS — H3530 Unspecified macular degeneration: Secondary | ICD-10-CM | POA: Diagnosis not present

## 2020-03-13 DIAGNOSIS — H401114 Primary open-angle glaucoma, right eye, indeterminate stage: Secondary | ICD-10-CM | POA: Diagnosis not present

## 2020-03-13 DIAGNOSIS — Z9981 Dependence on supplemental oxygen: Secondary | ICD-10-CM | POA: Diagnosis not present

## 2020-03-13 DIAGNOSIS — E1165 Type 2 diabetes mellitus with hyperglycemia: Secondary | ICD-10-CM | POA: Diagnosis not present

## 2020-03-13 DIAGNOSIS — I10 Essential (primary) hypertension: Secondary | ICD-10-CM | POA: Diagnosis not present

## 2020-03-16 ENCOUNTER — Encounter: Payer: Self-pay | Admitting: Cardiology

## 2020-03-16 ENCOUNTER — Ambulatory Visit (INDEPENDENT_AMBULATORY_CARE_PROVIDER_SITE_OTHER): Payer: PPO | Admitting: Cardiology

## 2020-03-16 ENCOUNTER — Telehealth: Payer: Self-pay | Admitting: Primary Care

## 2020-03-16 ENCOUNTER — Ambulatory Visit (INDEPENDENT_AMBULATORY_CARE_PROVIDER_SITE_OTHER): Payer: PPO

## 2020-03-16 ENCOUNTER — Other Ambulatory Visit: Payer: Self-pay

## 2020-03-16 VITALS — BP 140/68 | HR 85 | Temp 98.7°F | Ht 66.0 in | Wt 142.0 lb

## 2020-03-16 DIAGNOSIS — I471 Supraventricular tachycardia: Secondary | ICD-10-CM

## 2020-03-16 DIAGNOSIS — J189 Pneumonia, unspecified organism: Secondary | ICD-10-CM

## 2020-03-16 DIAGNOSIS — R002 Palpitations: Secondary | ICD-10-CM | POA: Diagnosis not present

## 2020-03-16 DIAGNOSIS — J449 Chronic obstructive pulmonary disease, unspecified: Secondary | ICD-10-CM

## 2020-03-16 MED ORDER — TRELEGY ELLIPTA 100-62.5-25 MCG/INH IN AEPB: 1.0000 | INHALATION_SPRAY | Freq: Every day | RESPIRATORY_TRACT | 0 refills | Status: DC

## 2020-03-16 NOTE — Telephone Encounter (Signed)
Samples left up front for pt to pick up. Patrice advised daughter, nothing further is needed.

## 2020-03-16 NOTE — Progress Notes (Addendum)
Cardiology Office Note   Date:  03/18/2020   ID:  [name], DOB 30-Dec-1926, MRN [id number]  PCP:  Christain Sacramento, MD  Cardiologist:  Dr. Harl Bowie     Chief Complaint  Patient presents with   Hospitalization Follow-up    MAT       History of Present Illness: [name] is a 86 y.o. male who presents for post hospitalization 02/27/20 to 02/29/20 with PNA and found to have MAT.  He has a hx of COPD, on home 02, skin cancers, borderline diabetic, HTN, remote tobacco use Pt with MAT and not a fib.  HR should improve with resolution of PNA.   On dilt 180 SR at discharge and HR 89  Echo EF 70-75% No RWMA  Today pt was with his son.  He feels well. No chest pain and no SOB.  No edema.  He is not aware of any rapid HR but was not on admit either.  He is feeling good.  He is on chronic 02 and it is running low today.    Past Medical History:  Diagnosis Date   Allergic rhinitis, cause unspecified    Asthma    BPH (benign prostatic hyperplasia)    Chronic airway obstruction, not elsewhere classified    Esophageal reflux    Macular degeneration     Past Surgical History:  Procedure Laterality Date   TURP VAPORIZATION     VASECTOMY       Current Outpatient Medications  Medication Sig Dispense Refill   acetaminophen (TYLENOL) 325 MG tablet Take 650 mg by mouth every 6 (six) hours as needed for mild pain.      ADVAIR DISKUS 100-50 MCG/DOSE AEPB USE 1 PUFF TWICE DAILY. RINSE MOUTH WITH WATER AFTER EACH USE. 60 each 3   ascorbic acid (VITAMIN C) 500 MG tablet Take 500 mg by mouth daily.      bimatoprost (LUMIGAN) 0.01 % SOLN Place 1 drop into both eyes at bedtime.      brimonidine (ALPHAGAN) 0.2 % ophthalmic solution Place 1 drop into both eyes 2 (two) times daily.     diltiazem (CARDIZEM CD) 180 MG 24 hr capsule Take 1 capsule (180 mg total) by mouth daily. 30 capsule 3   dorzolamide (TRUSOPT) 2 % ophthalmic solution Place 1 drop into both eyes 3 (three) times  daily.      doxazosin (CARDURA) 4 MG tablet Take 2 mg by mouth daily.     feeding supplement, ENSURE ENLIVE, (ENSURE ENLIVE) LIQD Take 237 mLs by mouth 2 (two) times daily between meals. 237 mL 12   finasteride (PROSCAR) 5 MG tablet Take 2.5 mg by mouth daily.      Fluticasone-Umeclidin-Vilant (TRELEGY ELLIPTA) 100-62.5-25 MCG/INH AEPB Inhale 1 puff into the lungs daily. 2 each 0   gabapentin (NEURONTIN) 100 MG capsule Take 100 mg by mouth at bedtime.     guaiFENesin (ROBITUSSIN) 100 MG/5ML SOLN Take 5 mLs (100 mg total) by mouth every 4 (four) hours as needed for cough or to loosen phlegm. 236 mL 0   latanoprost (XALATAN) 0.005 % ophthalmic solution Place 1 drop into both eyes at bedtime.      levalbuterol (XOPENEX) 0.63 MG/3ML nebulizer solution Take 3 mLs (0.63 mg total) by nebulization every 6 (six) hours as needed for wheezing or shortness of breath. 3 mL 6   MELATONIN PO Take by mouth. prn     Multiple Vitamins-Minerals (ICAPS PO) Take 1 tablet by mouth daily.  pantoprazole (PROTONIX) 20 MG tablet Take 20 mg by mouth every other day.     Probiotic Product (ALIGN PO) Take by mouth. Take 1 capsule three times a week by mouth.     vitamin E 100 UNIT capsule Take 100 Units by mouth. Take by mouth twice a day     No current facility-administered medications for this visit.    Allergies:   Escitalopram oxalate and Tape    Social History:  The patient  reports that he quit smoking about 28 years ago. His smoking use included cigarettes. He has a 100.00 pack-year smoking history. He has never used smokeless tobacco. He reports current alcohol use. He reports that he does not use drugs.   Family History:  The patient's family history includes Cancer in an other family member.    ROS:  None  Wt Readings from Last 3 Encounters:  03/16/20 142 lb (64.4 kg)  03/02/20 145 lb (65.8 kg)  02/28/20 142 lb 10.2 oz (64.7 kg)     PHYSICAL EXAM: VS:  140/68  P 85  sp02 on 02 at 92%   General:Pleasant affect, NAD Skin:Warm and dry, brisk capillary refill HEENT:normocephalic, sclera clear, mucus membranes moist very hard of hearing with hearing aids.  Neck:supple, no JVD, no bruits  Heart:S1S2 RRR without murmur, gallup, rub or click Lungs:clear without rales, rhonchi, or wheezes [id number], non tender, + BS, do not palpate liver spleen or masses Ext:no lower ext edema, 2+ pedal pulses, 2+ radial pulses Neuro:alert and oriented X 3, MAE, follows commands, + facial symmetry   Recent Labs: 02/27/2020: B Natriuretic Peptide 84.0 02/28/2020: ALT 19; BUN 13; Creatinine, Ser 0.82; Potassium 4.2; Sodium 138 02/29/2020: Hemoglobin 16.1; Magnesium 2.3; Platelets 258; TSH 0.618    Lipid Panel    Component Value Date/Time   CHOL  04/04/2010 0540    144        ATP III CLASSIFICATION:  <200     mg/dL   Desirable  200-239  mg/dL   Borderline High  >=240    mg/dL   High          TRIG 51 04/04/2010 0540   HDL 47 04/04/2010 0540   CHOLHDL 3.1 04/04/2010 0540   VLDL 10 04/04/2010 0540   LDLCALC  04/04/2010 0540    87        Total Cholesterol/HDL:CHD Risk Coronary Heart Disease Risk Table                     Men   Women  1/2 Average Risk   3.4   3.3  Average Risk       5.0   4.4  2 X Average Risk   9.6   7.1  3 X Average Risk  23.4   11.0        Use the calculated Patient Ratio above and the CHD Risk Table to determine the patient's CHD Risk.        ATP III CLASSIFICATION (LDL):  <100     mg/dL   Optimal  100-129  mg/dL   Near or Above                    Optimal  130-159  mg/dL   Borderline  160-189  mg/dL   High  >190     mg/dL   Very High       Other studies Reviewed: Additional studies/ records that were reviewed today include: .  Echo 02/29/20  IMPRESSIONS    1. Left ventricular ejection fraction, by estimation, is 70 to 75%. The  left ventricle has hyperdynamic function. The left ventricle has no  regional wall motion abnormalities. Left ventricular  diastolic parameters  are indeterminate.  2. Right ventricular systolic function is normal. The right ventricular  size is normal. There is mildly elevated pulmonary artery systolic  pressure.  3. Left atrial size was mildly dilated.  4. The mitral valve is abnormal. Mild mitral valve regurgitation. No  evidence of mitral stenosis.  5. The aortic valve is tricuspid. Aortic valve regurgitation is not  visualized. No aortic stenosis is present.  6. The inferior vena cava is normal in size with greater than 50%  respiratory variability, suggesting right atrial pressure of 3 mmHg.   FINDINGS  Left Ventricle: Left ventricular ejection fraction, by estimation, is 70  to 75%. The left ventricle has hyperdynamic function. The left ventricle  has no regional wall motion abnormalities. The left ventricular internal  cavity size was normal in size.  There is no left ventricular hypertrophy. Left ventricular diastolic  parameters are indeterminate.   Right Ventricle: The right ventricular size is normal. No increase in  right ventricular wall thickness. Right ventricular systolic function is  normal. There is mildly elevated pulmonary artery systolic pressure. The  tricuspid regurgitant velocity is 2.67  m/s, and with an assumed right atrial pressure of 8 mmHg, the estimated  right ventricular systolic pressure is [id number] mmHg.   Left Atrium: Left atrial size was mildly dilated.   Right Atrium: Right atrial size was normal in size.   Pericardium: There is no evidence of pericardial effusion.   Mitral Valve: The mitral valve is abnormal. There is mild thickening of  the mitral valve leaflet(s). There is mild calcification of the mitral  valve leaflet(s). Mild mitral annular calcification. Mild mitral valve  regurgitation. No evidence of mitral  valve stenosis.   Tricuspid Valve: The tricuspid valve is normal in structure. Tricuspid  valve regurgitation is mild . No evidence of  tricuspid stenosis.   Aortic Valve: The aortic valve is tricuspid. . There is mild thickening  and mild calcification of the aortic valve. Aortic valve regurgitation is  not visualized. No aortic stenosis is present. Mild aortic valve annular  calcification. There is mild  thickening of the aortic valve. There is mild calcification of the aortic  valve. Aortic valve mean gradient measures 6.8 mmHg. Aortic valve peak  gradient measures 13.6 mmHg. Aortic valve area, by VTI measures 2.03 cm.   Pulmonic Valve: The pulmonic valve was not well visualized. Pulmonic valve  regurgitation is not visualized. No evidence of pulmonic stenosis.   Aorta: The aortic root is normal in size and structure.   Pulmonary Artery: No significant pulmonary HTN, PASP is 30 mmHg.   Venous: The inferior vena cava is normal in size with greater than 50%  respiratory variability, suggesting right atrial pressure of 3 mmHg.   IAS/Shunts: No atrial level shunt detected by color flow Doppler.  ASSESSMENT AND PLAN:  1.  MAT with PNA during hospitalization,  Echo was stable and normal to elevated EF.   No awareness of fast HR but he was not aware in the hospital.  It is regular today will arrange zio patch for 2 weeks.  Then follow up in 1 month with Dr. Harl Bowie.    2.  Recovering PNA without complaints completed abx.  3.  COPD on home 02 continue and no wheezes today.  Current medicines are reviewed with the patient today.  The patient Has no concerns regarding medicines.  The following changes have been made:  See above Labs/ tests ordered today include:see above  Disposition:   FU:  see above  Signed, Cecilie Kicks, NP  03/18/2020 8:48 PM    [facility] [place], [place], [place], [place] Phone: [phone]; Fax: [phone]

## 2020-03-16 NOTE — Patient Instructions (Signed)
Medication Instructions:  Your physician recommends that you continue on your current medications as directed. Please refer to the Current Medication list given to you today.   Labwork: none  Testing/Procedures: Your physician has recommended that you wear an event monitor. Event monitors are medical devices that record the heart's electrical activity. Doctors most often [place] these monitors to diagnose arrhythmias. Arrhythmias are problems with the speed or rhythm of the heartbeat. The monitor is a small, portable device. You can wear one while you do your normal daily activities. This is usually used to diagnose what is causing palpitations/syncope (passing out).    Follow-Up: Your physician recommends that you schedule a follow-up appointment in: 1 month    Any Other Special Instructions Will Be Listed Below (If Applicable).     If you need a refill on your cardiac medications before your next appointment, please call your pharmacy.

## 2020-03-18 ENCOUNTER — Encounter: Payer: Self-pay | Admitting: Cardiology

## 2020-03-18 NOTE — Addendum Note (Signed)
Addended by: Isaiah Serge on: 03/18/2020 08:55 PM   Modules accepted: Level of Service

## 2020-03-19 ENCOUNTER — Other Ambulatory Visit: Payer: Self-pay

## 2020-03-19 MED ORDER — TRELEGY ELLIPTA 100-62.5-25 MCG/INH IN AEPB: 1.0000 | INHALATION_SPRAY | Freq: Every day | RESPIRATORY_TRACT | 5 refills | Status: DC

## 2020-03-20 ENCOUNTER — Telehealth: Payer: Self-pay | Admitting: Student

## 2020-03-20 NOTE — Telephone Encounter (Signed)

## 2020-03-21 DIAGNOSIS — J343 Hypertrophy of nasal turbinates: Secondary | ICD-10-CM | POA: Diagnosis not present

## 2020-03-21 DIAGNOSIS — H903 Sensorineural hearing loss, bilateral: Secondary | ICD-10-CM | POA: Diagnosis not present

## 2020-03-21 DIAGNOSIS — H60541 Acute eczematoid otitis externa, right ear: Secondary | ICD-10-CM | POA: Diagnosis not present

## 2020-03-21 DIAGNOSIS — H6123 Impacted cerumen, bilateral: Secondary | ICD-10-CM | POA: Diagnosis not present

## 2020-03-21 DIAGNOSIS — Z974 Presence of external hearing-aid: Secondary | ICD-10-CM | POA: Diagnosis not present

## 2020-03-22 ENCOUNTER — Ambulatory Visit (INDEPENDENT_AMBULATORY_CARE_PROVIDER_SITE_OTHER): Payer: PPO

## 2020-03-22 ENCOUNTER — Other Ambulatory Visit: Payer: Self-pay

## 2020-03-22 ENCOUNTER — Ambulatory Visit: Payer: PPO | Admitting: Primary Care

## 2020-03-22 ENCOUNTER — Encounter: Payer: Self-pay | Admitting: Primary Care

## 2020-03-22 VITALS — BP 128/60 | HR 81 | Temp 97.8°F | Ht 66.0 in | Wt 144.4 lb

## 2020-03-22 DIAGNOSIS — J449 Chronic obstructive pulmonary disease, unspecified: Secondary | ICD-10-CM

## 2020-03-22 DIAGNOSIS — J189 Pneumonia, unspecified organism: Secondary | ICD-10-CM

## 2020-03-22 DIAGNOSIS — J9611 Chronic respiratory failure with hypoxia: Secondary | ICD-10-CM

## 2020-03-22 MED ORDER — TRELEGY ELLIPTA 100-62.5-25 MCG/INH IN AEPB: 1.0000 | INHALATION_SPRAY | Freq: Every day | RESPIRATORY_TRACT | 5 refills | Status: DC

## 2020-03-22 NOTE — Patient Instructions (Addendum)
   Recommendations: Continue Trelegy 1 puff daily (rinse mouth after use) Use xopenex nebulizer only as needed every 6 hours for breakthrough shortness of breath/wheezing Continue guaifenesin 100mg /44ml q4 hours for congestion  Oxygen: Continue 3-4 L oxygen to keep > 88% I am ok with you getting [employer] continuous portable oxygen concentrator - you will need to use 3-4 L oxygen  I am ok with you going outside on lawn mower with supervision for short period of time (oxygen safety tips printed)  Orders: CXR today to follow-up on previous pneumonia   Follow-up: July as scheduled with Dr. Annamaria Boots

## 2020-03-22 NOTE — Progress Notes (Signed)
@Patient  ID: [name], male    DOB: [birth year], 86 y.o.   MRN: [id number]  Chief Complaint  Patient presents with  . Follow-up    Sob-same, no cough, occass. wheezing    Referring provider: Christain [place], MD  HPI: 86 year old male, former smoker quit 1993 (100 pack year hx). PMH significant for asthma/COPD, interstitial lung disease (UIP), chronic respiratory failure with hypoxia (oxygen dependent), allergic rhinitis, esophageal reflux, HTN, type 2 diabetes. Patient of Dr. Annamaria Boots. Maintained on Trelegy and 3-4L oxygen.   Recently admitted to the hospital for community acquired pneumonia from 3/22-3/24, treated with 7 day course of oral Augmentin for 7 days. He was also started on cardizem by cardiology for multifocal atrial tachycardia. He did not require additional oxygen.   03/02/2020 Patient presents today for hospital follow-up. Accompanied by his daughter. He is doing ok, all things considered. He recently lost his wife earlier this month and his health has declined overall the past few weeks. His breathing is some worse. His appetite is ok, he is weak. He is in a wheelchair which is new for him. He has an occasional cough. He is currently on 3L oxygen sating 90%. He was previously only using Advair inhaler once daily to save money. Now needing to use it twice daily. Daughter feels he doesn't have the strength to use it properly. Patient did well with one of the nebulizer's that was given to him in the hospital, daughter states albuterol causes him to have a reaction. They are looking into getting a continuous portable oxygen concentrator. DME company is [employer], however, they are unhappy with them. He had some surgical procedures to his ears, the loops on face mask and oxygen tubing bother him so his daughter is looking into alternative oxygen delivery options.   03/22/2020 Patient presents today for a 2-week follow-up.  Accompanied by his daughter.  He is doing a good amount better.  His  daughter feels that his breathing is getting better. He continues to have a productive cough with creamy white to beige mucus.  He has been using Trelegy daily. His daughter states that he cannot walk far without getting short of breath.  It does not appear that he has needed to use his Xopenex nebulizer that often. His daughter states that he has also been sleeping better. States that he really misses going outside and riding his lawnmower, this is very important to him.  She states that he has declined since his wife passed away.  His mood has seemed somewhat improved over the last 2 weeks.  Family is very supportive and involved in his care.  He recently saw cardiology for a televisit on April 9 and was ordered for a Zio patch for 2 weeks due to atrial tachycardia while he was in the hospital.  He is currently on Cardizem and his heart rate is controlled.  His daughter would not like to pursue any other treatment measures/work-up regarding this, she feels his atrial tachycardia was brought on by his pnuemonia.  He is currently using 3.5 to 4 L of continuous oxygen.  He has a oxygen concentrator at home that goes up to 5 L, she is wondering if they need a larger one.  At this time since he is not requiring higher level of oxygen I do not think this is necessary.  She agrees.  They are mainly interested in getting an [employer] continuous portable oxygen concentrator for patient to use to allow him  more mobility when leaving the house's and going outside.   Allergies  Allergen Reactions  . Escitalopram Oxalate Palpitations  . Tape Rash and Other (See Comments)    Pulls off skin    Immunization History  Administered Date(s) Administered  . Fluad Quad(high Dose 65+) 08/01/2019  . H1N1 01/11/2009  . Influenza Split 09/08/2011, 08/08/2012, 09/06/2013, 10/19/2014  . Influenza, High Dose Seasonal PF 09/10/2016, 08/20/2017, 09/02/2018  . Influenza,inj,Quad PF,6+ Mos 09/17/2015  . Influenza-Unspecified  09/17/2015, 09/10/2016  . PFIZER SARS-COV-2 Vaccination 01/02/2020, 01/23/2020  . Pneumococcal Conjugate-13 04/06/2014, 09/10/2016  . Pneumococcal Polysaccharide-23 04/06/2014, 09/10/2016  . Tdap 06/29/2019, 06/30/2019  . Zoster Recombinat (Shingrix) 02/18/2018, 02/22/2018, 05/11/2018, 05/26/2018    Past Medical History:  Diagnosis Date  . Allergic rhinitis, cause unspecified   . Asthma   . BPH (benign prostatic hyperplasia)   . Chronic airway obstruction, not elsewhere classified   . Esophageal reflux   . Macular degeneration     Tobacco History: Social History   Tobacco Use  Smoking Status Former Smoker  . Packs/day: 2.00  . Years: 50.00  . Pack years: 100.00  . Types: Cigarettes  . Quit date: 12/09/1991  . Years since quitting: 28.3  Smokeless Tobacco Never Used   Counseling given: Not Answered   Outpatient Medications Prior to Visit  Medication Sig Dispense Refill  . acetaminophen (TYLENOL) 325 MG tablet Take 650 mg by mouth every 6 (six) hours as needed for mild pain.     Marland Kitchen ascorbic acid (VITAMIN C) 500 MG tablet Take 500 mg by mouth daily.     . bimatoprost (LUMIGAN) 0.01 % SOLN Place 1 drop into both eyes at bedtime.     . brimonidine (ALPHAGAN) 0.2 % ophthalmic solution Place 1 drop into both eyes 2 (two) times daily.    Marland Kitchen diltiazem (CARDIZEM CD) 180 MG 24 hr capsule Take 1 capsule (180 mg total) by mouth daily. 30 capsule 3  . dorzolamide (TRUSOPT) 2 % ophthalmic solution Place 1 drop into both eyes 3 (three) times daily.     Marland Kitchen doxazosin (CARDURA) 4 MG tablet Take 2 mg by mouth daily.    . feeding supplement, ENSURE ENLIVE, (ENSURE ENLIVE) LIQD Take 237 mLs by mouth 2 (two) times daily between meals. 237 mL 12  . finasteride (PROSCAR) 5 MG tablet Take 2.5 mg by mouth daily.     Marland Kitchen gabapentin (NEURONTIN) 100 MG capsule Take 100 mg by mouth at bedtime.    Marland Kitchen guaiFENesin (ROBITUSSIN) 100 MG/5ML SOLN Take 5 mLs (100 mg total) by mouth every 4 (four) hours as needed for  cough or to loosen phlegm. 236 mL 0  . latanoprost (XALATAN) 0.005 % ophthalmic solution Place 1 drop into both eyes at bedtime.     . levalbuterol (XOPENEX) 0.63 MG/3ML nebulizer solution Take 3 mLs (0.63 mg total) by nebulization every 6 (six) hours as needed for wheezing or shortness of breath. 3 mL 6  . MELATONIN PO Take by mouth. prn    . Multiple Vitamins-Minerals (ICAPS PO) Take 1 tablet by mouth daily.     . pantoprazole (PROTONIX) 20 MG tablet Take 20 mg by mouth every other day.    . Probiotic Product (ALIGN PO) Take by mouth. Take 1 capsule three times a week by mouth.    . vitamin E 100 UNIT capsule Take 100 Units by mouth. Take by mouth twice a day    . Fluticasone-Umeclidin-Vilant (TRELEGY ELLIPTA) 100-62.5-25 MCG/INH AEPB Inhale 1 puff into the lungs  daily. Rinse mouth after use. 60 each 5  . ADVAIR DISKUS 100-50 MCG/DOSE AEPB USE 1 PUFF TWICE DAILY. RINSE MOUTH WITH WATER AFTER EACH USE. (Patient not taking: Reported on 03/22/2020) 60 each 3   No facility-administered medications prior to visit.    Review of Systems  Review of Systems  Respiratory: Positive for cough. Negative for shortness of breath.    Physical Exam  BP 128/60 (BP Location: Right Arm, Cuff Size: Normal)   Pulse 81   Temp 97.8 F (36.6 C) (Temporal)   Ht 5\' 6"  (1.676 m)   Wt 144 lb 6.4 oz (65.5 kg)   SpO2 92%   BMI 23.31 kg/m  Physical Exam Constitutional:      Appearance: Normal appearance.     Comments: Elderly gentleman, well dressed  HENT:     Mouth/Throat:     Comments: Deferred d/t masking Cardiovascular:     Rate and Rhythm: Normal rate.  Pulmonary:     Effort: Pulmonary effort is normal.     Breath sounds: Rhonchi present.     Comments: 4L oxygen Musculoskeletal:     Comments: In WC  Skin:    General: Skin is warm and dry.  Neurological:     General: No focal deficit present.     Mental Status: He is alert. Mental status is at baseline.  Psychiatric:        Mood and Affect:  Mood normal.        Behavior: Behavior normal.      Lab Results:  CBC    Component Value Date/Time   WBC 16.1 (H) 02/29/2020 0537   RBC 5.18 02/29/2020 0537   HGB 16.1 02/29/2020 0537   HCT 48.4 02/29/2020 0537   PLT 258 02/29/2020 0537   MCV 93.4 02/29/2020 0537   MCH 31.1 02/29/2020 0537   MCHC 33.3 02/29/2020 0537   RDW 13.5 02/29/2020 0537   LYMPHSABS 0.9 02/29/2020 0537   MONOABS 0.4 02/29/2020 0537   EOSABS 0.0 02/29/2020 0537   BASOSABS 0.0 02/29/2020 0537    BMET    Component Value Date/Time   NA 138 02/28/2020 0614   K 4.2 02/28/2020 0614   CL 105 02/28/2020 0614   CO2 23 02/28/2020 0614   GLUCOSE 203 (H) 02/28/2020 0614   BUN 13 02/28/2020 0614   CREATININE 0.82 02/28/2020 0614   CALCIUM 8.6 (L) 02/28/2020 0614   GFRNONAA >60 02/28/2020 0614   GFRAA >60 02/28/2020 0614    BNP    Component Value Date/Time   BNP 84.0 02/27/2020 2204    ProBNP    Component Value Date/Time   PROBNP 222.2 03/12/2014 1938    Imaging: DG Chest 2 View  Result Date: 03/22/2020 CLINICAL DATA:  Follow-up pneumonia EXAM: CHEST - 2 VIEW COMPARISON:  CTA and radiograph 02/27/2020 FINDINGS: Redemonstration of the extensive bilateral scarring and fibrosis with architectural distortion which is unchanged from comparison studies. There is a questionable increase in the opacity seen in the left lung base which could reflect some atelectatic change or superimposed consolidation. No pneumothorax or visible effusion. The aorta is calcified. The remaining cardiomediastinal contours are unremarkable. No acute osseous or soft tissue abnormality. Degenerative changes are present in the imaged spine and shoulders. A battery pack projects over the left chest. IMPRESSION: 1. Questionable increase in the opacity in the left lung base which could reflect some atelectatic change or superimposed consolidation, difficult to fully assess given the advanced scarring and fibrotic changes in both lungs.  Electronically Signed   By: Lovena Le [name]   On: 03/22/2020 22:39   CT Angio Chest PE W and/or Wo Contrast  Result Date: 02/27/2020 CLINICAL DATA:  Shortness of breath and low heart rate EXAM: CT ANGIOGRAPHY CHEST WITH CONTRAST TECHNIQUE: Multidetector CT imaging of the chest was performed using the standard protocol during bolus administration of intravenous contrast. Multiplanar CT image reconstructions and MIPs were obtained to evaluate the vascular anatomy. CONTRAST:  139mL OMNIPAQUE IOHEXOL 350 MG/ML SOLN COMPARISON:  Radiograph same day FINDINGS: Cardiovascular: There is a optimal opacification of the pulmonary arteries. There is no central,segmental, or subsegmental filling defects within the pulmonary arteries. There is prominence of the main pulmonary arteries measuring up to 2.8 cm, likely due to pulmonary arterial hypertension. There is mild cardiomegaly. Coronary artery calcifications are seen. No pericardial effusion or thickening. No evidence right heart strain. There is normal three-vessel brachiocephalic anatomy without proximal stenosis. Scattered aortic atherosclerosis. There is a mild amount of atherosclerosis seen at the origin of the left subclavian artery. Mediastinum/Nodes: No hilar, mediastinal, or axillary adenopathy. Thyroid gland, trachea, and esophagus demonstrate no significant findings. Lungs/Pleura: Extensive centrilobular emphysematous changes with subpleural bleb formation is again identified. There is diffuse areas of ground-glass opacity and interstitial thickening seen predominantly at both lung bases. No pleural effusion is seen. No pneumothorax. Upper Abdomen: No acute abnormalities present in the visualized portions of the upper abdomen. Musculoskeletal: No chest wall abnormality. No acute or significant osseous findings. Review of the MIP images confirms the above findings. IMPRESSION: Extensive advanced centrilobular emphysematous changes with underlying diffuse  ground-glass opacities and interstitial thickening at both lung bases which could be due to concomitant inflammatory/infectious etiology. No central, segmental, or subsegmental pulmonary embolism. Findings suggestive of pulmonary arterial hypertension. Aortic Atherosclerosis (ICD10-I70.0). Electronically Signed   By: Prudencio Pair [name]   On: 02/27/2020 23:59   DG Chest Port 1 View  Result Date: 02/27/2020 CLINICAL DATA:  Shortness of breath, hypoxia EXAM: PORTABLE CHEST 1 VIEW COMPARISON:  08/01/2019 FINDINGS: Single frontal view of the chest demonstrates extensive scarring and fibrosis unchanged since prior exam. No airspace disease, effusion, or pneumothorax. Cardiac silhouette is stable. No acute bony abnormalities. IMPRESSION: 1. Extensive bilateral scarring and fibrosis. No acute airspace disease. Electronically Signed   By: Randa Ngo [name]   On: 02/27/2020 22:32   ECHOCARDIOGRAM COMPLETE  Result Date: 02/29/2020    ECHOCARDIOGRAM REPORT   Patient Name:   JATAVIS SCHELLINGER Date of Exam: 02/29/2020 Medical Rec #:  [id number]     Height:       65.0 in Accession #:    [id number]    Weight:       142.6 lb Date of Birth:  08-May-1927      BSA:          1.713 m Patient Age:    26 years      BP:           112/65 mmHg Patient Gender: M             HR:           89 bpm. Exam Location:  [facility] Procedure: 2D Echo, Cardiac Doppler and Color Doppler Indications:    Abnormal ECG 794.31 / R94.31  History:        Patient has no prior history of Echocardiogram examinations.                 COPD; Risk Factors:Hypertension and Diabetes.  Sonographer:  Alvino Chapel RCS Referring Phys: [place]  1. Left ventricular ejection fraction, by estimation, is 70 to 75%. The left ventricle has hyperdynamic function. The left ventricle has no regional wall motion abnormalities. Left ventricular diastolic parameters are indeterminate.  2. Right ventricular systolic function is normal. The right ventricular size  is normal. There is mildly elevated pulmonary artery systolic pressure.  3. Left atrial size was mildly dilated.  4. The mitral valve is abnormal. Mild mitral valve regurgitation. No evidence of mitral stenosis.  5. The aortic valve is tricuspid. Aortic valve regurgitation is not visualized. No aortic stenosis is present.  6. The inferior vena cava is normal in size with greater than 50% respiratory variability, suggesting right atrial pressure of 3 mmHg. FINDINGS  Left Ventricle: Left ventricular ejection fraction, by estimation, is 70 to 75%. The left ventricle has hyperdynamic function. The left ventricle has no regional wall motion abnormalities. The left ventricular internal cavity size was normal in size. There is no left ventricular hypertrophy. Left ventricular diastolic parameters are indeterminate. Right Ventricle: The right ventricular size is normal. No increase in right ventricular wall thickness. Right ventricular systolic function is normal. There is mildly elevated pulmonary artery systolic pressure. The tricuspid regurgitant velocity is 2.67  m/s, and with an assumed right atrial pressure of 8 mmHg, the estimated right ventricular systolic pressure is [id number] mmHg. Left Atrium: Left atrial size was mildly dilated. Right Atrium: Right atrial size was normal in size. Pericardium: There is no evidence of pericardial effusion. Mitral Valve: The mitral valve is abnormal. There is mild thickening of the mitral valve leaflet(s). There is mild calcification of the mitral valve leaflet(s). Mild mitral annular calcification. Mild mitral valve regurgitation. No evidence of mitral valve stenosis. Tricuspid Valve: The tricuspid valve is normal in structure. Tricuspid valve regurgitation is mild . No evidence of tricuspid stenosis. Aortic Valve: The aortic valve is tricuspid. . There is mild thickening and mild calcification of the aortic valve. Aortic valve regurgitation is not visualized. No aortic stenosis is  present. Mild aortic valve annular calcification. There is mild thickening of the aortic valve. There is mild calcification of the aortic valve. Aortic valve mean gradient measures 6.8 mmHg. Aortic valve peak gradient measures 13.6 mmHg. Aortic valve area, by VTI measures 2.03 cm. Pulmonic Valve: The pulmonic valve was not well visualized. Pulmonic valve regurgitation is not visualized. No evidence of pulmonic stenosis. Aorta: The aortic root is normal in size and structure. Pulmonary Artery: No significant pulmonary HTN, PASP is 30 mmHg. Venous: The inferior vena cava is normal in size with greater than 50% respiratory variability, suggesting right atrial pressure of 3 mmHg. IAS/Shunts: No atrial level shunt detected by color flow Doppler.  LEFT VENTRICLE PLAX 2D LVIDd:         3.23 cm  Diastology LVIDs:         2.04 cm  LV e' lateral:   5.11 cm/s LV PW:         0.98 cm  LV E/e' lateral: 21.1 LV IVS:        1.00 cm  LV e' medial:    7.40 cm/s LVOT diam:     1.90 cm  LV E/e' medial:  14.6 LV SV:         78 LV SV Index:   45 LVOT Area:     2.84 cm  RIGHT VENTRICLE RV Mid diam:    3.20 cm TAPSE (M-mode): 2.2 cm LEFT ATRIUM  Index LA diam:      3.20 cm 1.87 cm/m LA Vol (A4C): 50.4 ml 29.41 ml/m  AORTIC VALVE AV Area (Vmax):    2.10 cm AV Area (Vmean):   2.47 cm AV Area (VTI):     2.03 cm AV Vmax:           184.61 cm/s AV Vmean:          119.524 cm/s AV VTI:            0.383 m AV Peak Grad:      13.6 mmHg AV Mean Grad:      6.8 mmHg LVOT Vmax:         137.00 cm/s LVOT Vmean:        104.000 cm/s LVOT VTI:          0.274 m LVOT/AV VTI ratio: 0.72  AORTA Ao Root diam: 3.50 cm MITRAL VALVE                TRICUSPID VALVE MV Area (PHT): 3.50 cm     TR Peak grad:   28.5 mmHg MV Decel Time: 217 msec     TR Vmax:        267.00 cm/s MV E velocity: 108.00 cm/s MV A velocity: 105.00 cm/s  SHUNTS MV E/A ratio:  1.03         Systemic VTI:  0.27 m                             Systemic Diam: 1.90 cm Carlyle Dolly MD  Electronically signed by Carlyle Dolly MD Signature Date/Time: 02/29/2020/2:54:59 PM    Final      Assessment & Plan:   COPD mixed type (HCC) - Continue Trelegy 1 puff daily (rinse mouth after use) - Use xopenex nebulizer only as needed every 6 hours for breakthrough shortness of breath/wheezing - Continue guaifenesin 100mg /6ml q4 hours for congestion   CAP (community acquired pneumonia) - Clinically he appears much better compared to two weeks ago. He is more alert, mood is improved. Well dressed. He wants to engage in activities and go outside. He is asking to go on his lawn mower, this is fine as long as he is supervised.  - CXR 03/22/2020 showed Questionable increase in the opacity in the left lung base which could reflect some atelectatic change or superimposed consolidation, difficult to fully assess given the advanced scarring and fibrotic changes in both lungs. - Discussed CXR result with daughter, clinically I do not feel his PNA has worsened. Mucus is not purulent and he is afebrile. Likely atelectasis with background of scarring and fibrosis, he is not currently using IS, recommending deep breathing exercises 10x 3-4 times a day.  - Daughter will monitor for worsening respiratory symptoms. Low threshold to restart course of abx  Chronic respiratory failure with hypoxia (HCC) - Continue 3-4L oxygen to keep O2 >88-90% - Ok to get inogen continuous portable oxygen concentrator  - Oxygen safety tips/education printed and reviewed    Martyn Ehrich, NP 03/24/2020

## 2020-03-23 MED ORDER — PREDNISONE 10 MG PO TABS: ORAL_TABLET | ORAL | 0 refills | Status: DC

## 2020-03-24 NOTE — Assessment & Plan Note (Addendum)
-   Continue 3-4L oxygen to keep O2 >88-90% - Ok to get inogen continuous portable oxygen concentrator  - Oxygen safety tips/education printed and reviewed

## 2020-03-24 NOTE — Assessment & Plan Note (Addendum)
-   Continue Trelegy 1 puff daily (rinse mouth after use) - Use xopenex nebulizer only as needed every 6 hours for breakthrough shortness of breath/wheezing - Continue guaifenesin 100mg /66ml q4 hours for congestion

## 2020-03-24 NOTE — Assessment & Plan Note (Signed)
-   Clinically he appears much better compared to two weeks ago. He is more alert, mood is improved. Well dressed. He wants to engage in activities and go outside. He is asking to go on his lawn mower, this is fine as long as he is supervised.  - CXR 03/22/2020 showed Questionable increase in the opacity in the left lung base which could reflect some atelectatic change or superimposed consolidation, difficult to fully assess given the advanced scarring and fibrotic changes in both lungs. - Discussed CXR result with daughter, clinically I do not feel his PNA has worsened. Mucus is not purulent and he is afebrile. Likely atelectasis with background of scarring and fibrosis, he is not currently using IS, recommending deep breathing exercises 10x 3-4 times a day.  - Daughter will monitor for worsening respiratory symptoms. Low threshold to restart course of abx

## 2020-03-27 ENCOUNTER — Telehealth: Payer: Self-pay | Admitting: Primary Care

## 2020-03-27 NOTE — Telephone Encounter (Signed)
[place] and spoke with Lelon Huh in regards to pt's Trelegy Rx.  Stated to her that pt only needed to have the 90-day of the Trelegy filled as it seems like it was sent twice, one for the 90-day supply and then another for a 30-day supply. Laquisha verbalized understanding.  Nothing further needed.

## 2020-03-28 ENCOUNTER — Telehealth: Payer: Self-pay | Admitting: Primary Care

## 2020-03-28 MED ORDER — TRELEGY ELLIPTA 100-62.5-25 MCG/INH IN AEPB: 1.0000 | INHALATION_SPRAY | Freq: Every day | RESPIRATORY_TRACT | 3 refills | Status: AC

## 2020-03-28 NOTE — Telephone Encounter (Signed)
Yes Trelegy is to replace the Advair. I have sent a 90 day supply to [facility].

## 2020-03-29 ENCOUNTER — Telehealth: Payer: Self-pay | Admitting: Primary Care

## 2020-03-29 NOTE — Telephone Encounter (Signed)
Called patient but he did not answer. His VM was not setup. Will call back later.

## 2020-03-30 NOTE — Telephone Encounter (Signed)
Needs to know what mail order pharm? ATC, NA and no VM

## 2020-04-04 NOTE — Telephone Encounter (Signed)
ATC patient [phone] [facility] to verify pharmacy

## 2020-04-04 NOTE — Telephone Encounter (Signed)
Daughter called back and verified mail order pharmacy and also mentioned that she thought this had been taken care of already. Looks like it was sent in by [facility] on 4/21. Nothing further needed at this time.

## 2020-04-05 DIAGNOSIS — J438 Other emphysema: Secondary | ICD-10-CM | POA: Diagnosis not present

## 2020-04-05 DIAGNOSIS — J432 Centrilobular emphysema: Secondary | ICD-10-CM | POA: Diagnosis not present

## 2020-04-05 DIAGNOSIS — J449 Chronic obstructive pulmonary disease, unspecified: Secondary | ICD-10-CM | POA: Diagnosis not present

## 2020-04-05 DIAGNOSIS — J849 Interstitial pulmonary disease, unspecified: Secondary | ICD-10-CM | POA: Diagnosis not present

## 2020-04-13 DIAGNOSIS — R002 Palpitations: Secondary | ICD-10-CM | POA: Diagnosis not present

## 2020-04-17 ENCOUNTER — Other Ambulatory Visit: Payer: Self-pay

## 2020-04-17 ENCOUNTER — Ambulatory Visit: Payer: PPO | Admitting: Student

## 2020-04-17 ENCOUNTER — Telehealth: Payer: PPO | Admitting: Student

## 2020-04-17 NOTE — Progress Notes (Signed)
Virtual Visit via Telephone Note   This visit type was conducted due to national recommendations for restrictions regarding the COVID-19 Pandemic (e.g. social distancing) in an effort to limit this patient's exposure and mitigate transmission in our community.  Due to his co-morbid illnesses, this patient is at least at moderate risk for complications without adequate follow up.  This format is felt to be most appropriate for this patient at this time.  The patient did not have access to video technology/had technical difficulties with video requiring transitioning to audio format only (telephone).  All issues noted in this document were discussed and addressed.  No physical exam could be performed with this format.  Please refer to the patient's chart for his  consent to telehealth for [facility].   The patient was identified using 2 identifiers.  Date:  04/18/2020   ID:  [name], DOB 04/05/1927, MRN [id number]  Patient Location: Home Provider Location: Office  PCP:  Christain Sacramento, MD  Cardiologist:  Carlyle Dolly, MD  Electrophysiologist:  None   Evaluation Performed:  Follow-Up Visit  Chief Complaint: Follow-up from monitor  History of Present Illness:    [name] is a 86 y.o. male with with past medical history of COPD (on 3L [place] at baseline), HTN, prediabetes and prior tobacco use who presents to the office today for 1 month follow-up.  He was admitted to [facility] in 02/2020 for evaluation of worsening dyspnea and was found to have an acute COPD exacerbation and CAP. Cardiology was consulted in the setting of tachycardia which was felt to be most consistent with multifocal atrial tachycardia as definitive atrial fibrillation or flutter were not identified by review of telemetry. He was started on short acting Cardizem which was consolidated to Cardizem CD 180 mg daily at the time of discharge.  He had a follow-up visit with Cecilie Kicks, NP on 03/16/2020 and it was  felt that his breathing was back to baseline and denied any chest pain or palpitations. He was continued on his current regimen and a 2-week Zio patch was recommended to assess for any recurrent arrhythmias.  His 14-day monitor showed frequent supraventricular ectopy and short runs of SVT but was also noted to have episodes of atrial fibrillation with HR up to 135 bpm. Did have rare PVC's and an isolated 9 beat run of NSVT.   Today, I spoke with the patient's daughter as she reports her father is very hard of hearing over the phone. He has not experienced any recent chest pain or palpitations. He does have dyspnea on exertion due to COPD and typically uses between 3-4 L [place] at baseline. He sometimes forgets to breathe through his mouth and will become hypoxic but saturations improve to 92% once informed to breathe through his nose. No recent PND or lower extremity edema. She did speak to his Pulmonologist earlier today and they are starting him on an antibiotic due to his worsening productive cough.  He is followed by Dermatology at [facility] and has frequent Mohs Surgery due to basal cell skin cancers. He has told his dermatologist he does not want to proceed with further procedures unless absolutely necessary given his advanced age. His daughter reports he previously had significant bleeding with taking ASA 81 mg every other day and des not think he could tolerate a blood thinner. She reports he has been heart broken since his wife of 70+ years passed away this spring.    Past Medical History:  Diagnosis Date  . Allergic rhinitis, cause unspecified   . Asthma   . BPH (benign prostatic hyperplasia)   . Chronic airway obstruction, not elsewhere classified   . Esophageal reflux   . Macular degeneration    Past Surgical History:  Procedure Laterality Date  . TURP VAPORIZATION    . VASECTOMY       Current Meds  Medication Sig  . acetaminophen (TYLENOL) 325 MG tablet Take 650 mg by mouth every 6  (six) hours as needed for mild pain.   Marland Kitchen ascorbic acid (VITAMIN C) 500 MG tablet Take 500 mg by mouth daily.   Marland Kitchen azithromycin (ZITHROMAX) 250 MG tablet Take 2 tablets on first day, then 1 tablet daily until finished.  . bimatoprost (LUMIGAN) 0.01 % SOLN Place 1 drop into both eyes at bedtime.   . brimonidine (ALPHAGAN) 0.2 % ophthalmic solution Place 1 drop into both eyes 2 (two) times daily.  . dorzolamide (TRUSOPT) 2 % ophthalmic solution Place 1 drop into both eyes 3 (three) times daily.   Marland Kitchen doxazosin (CARDURA) 4 MG tablet Take 2 mg by mouth daily.  . feeding supplement, ENSURE ENLIVE, (ENSURE ENLIVE) LIQD Take 237 mLs by mouth 2 (two) times daily between meals.  . finasteride (PROSCAR) 5 MG tablet Take 2.5 mg by mouth daily.   . Fluticasone-Umeclidin-Vilant (TRELEGY ELLIPTA) 100-62.5-25 MCG/INH AEPB Inhale 1 puff into the lungs daily. Rinse mouth after use.  . gabapentin (NEURONTIN) 100 MG capsule Take 100 mg by mouth at bedtime.  Marland Kitchen guaiFENesin (ROBITUSSIN) 100 MG/5ML SOLN Take 5 mLs (100 mg total) by mouth every 4 (four) hours as needed for cough or to loosen phlegm.  . latanoprost (XALATAN) 0.005 % ophthalmic solution Place 1 drop into both eyes at bedtime.   . levalbuterol (XOPENEX) 0.63 MG/3ML nebulizer solution Take 3 mLs (0.63 mg total) by nebulization every 6 (six) hours as needed for wheezing or shortness of breath.  . MELATONIN PO Take by mouth. prn  . Multiple Vitamins-Minerals (ICAPS PO) Take 1 tablet by mouth daily.   . pantoprazole (PROTONIX) 20 MG tablet Take 20 mg by mouth every other day.  . predniSONE (DELTASONE) 10 MG tablet Take 1 tablet daily x 1 week; 1/2 tablet for 1 week  . Probiotic Product (ALIGN PO) Take by mouth. Take 1 capsule three times a week by mouth.  . vitamin E 100 UNIT capsule Take 100 Units by mouth. Take by mouth twice a day     Allergies:   Escitalopram oxalate and Tape   Social History   Tobacco Use  . Smoking status: Former Smoker    Packs/day:  2.00    Years: 50.00    Pack years: 100.00    Types: Cigarettes    Quit date: 12/09/1991    Years since quitting: 28.3  . Smokeless tobacco: Never Used  Substance Use Topics  . Alcohol use: Yes    Comment: occasional glass of red wine  . Drug use: Never     Family Hx: The patient's family history includes Cancer in an other family member.  ROS:   Please see the history of present illness.     All other systems reviewed and are negative.   Prior CV studies:   The following studies were reviewed today:  Echocardiogram: 02/2020 IMPRESSIONS    1. Left ventricular ejection fraction, by estimation, is 70 to 75%. The  left ventricle has hyperdynamic function. The left ventricle has no  regional wall motion abnormalities. Left ventricular  diastolic parameters  are indeterminate.  2. Right ventricular systolic function is normal. The right ventricular  size is normal. There is mildly elevated pulmonary artery systolic  pressure.  3. Left atrial size was mildly dilated.  4. The mitral valve is abnormal. Mild mitral valve regurgitation. No  evidence of mitral stenosis.  5. The aortic valve is tricuspid. Aortic valve regurgitation is not  visualized. No aortic stenosis is present.  6. The inferior vena cava is normal in size with greater than 50%  respiratory variability, suggesting right atrial pressure of 3 mmHg.   Event Monitor: 03/2020  14 day event monitor  Min HR 42, Max HR 203, Avg HR 81  Frequent supreventricular ectopy as PACs, couplets, triplets. Short runs of SVT. Episodes of afib up to 135 bpm  Rare ventricular ectopy in the form of isolated PVCs, couplets.Isolated 9 beat run of NSVT  Labs/Other Tests and Data Reviewed:    EKG:  No ECG reviewed.  Recent Labs: 02/27/2020: B Natriuretic Peptide 84.0 02/28/2020: ALT 19; BUN 13; Creatinine, Ser 0.82; Potassium 4.2; Sodium 138 02/29/2020: Hemoglobin 16.1; Magnesium 2.3; Platelets 258; TSH 0.618   Recent  Lipid Panel Lab Results  Component Value Date/Time   CHOL  04/04/2010 05:40 AM    144        ATP III CLASSIFICATION:  <200     mg/dL   Desirable  200-239  mg/dL   Borderline High  >=240    mg/dL   High          TRIG 51 04/04/2010 05:40 AM   HDL 47 04/04/2010 05:40 AM   CHOLHDL 3.1 04/04/2010 05:40 AM   LDLCALC  04/04/2010 05:40 AM    87        Total Cholesterol/HDL:CHD Risk Coronary Heart Disease Risk Table                     Men   Women  1/2 Average Risk   3.4   3.3  Average Risk       5.0   4.4  2 X Average Risk   9.6   7.1  3 X Average Risk  23.4   11.0        Use the calculated Patient Ratio above and the CHD Risk Table to determine the patient's CHD Risk.        ATP III CLASSIFICATION (LDL):  <100     mg/dL   Optimal  100-129  mg/dL   Near or Above                    Optimal  130-159  mg/dL   Borderline  160-189  mg/dL   High  >190     mg/dL   Very High    Wt Readings from Last 3 Encounters:  03/22/20 144 lb 6.4 oz (65.5 kg)  03/16/20 142 lb (64.4 kg)  03/02/20 145 lb (65.8 kg)     Objective:    Vital Signs:  Pulse 80   SpO2 92%    Physical Exam not performed. Phone Visit.   ASSESSMENT & PLAN:    1. MAT/ Paroxysmal Atrial Fibrillation - he was diagnosed with multifocal atrial tachycardia during his admission in 02/2020 and recent outpatient monitor showed frequent supraventricular ectopy and short runs of SVT but was also noted to have episodes of atrial fibrillation with HR up to 135 bpm. Did have rare PVC's and an isolated 9 beat run of NSVT.  -  reviewed findings with the patient's daughter today, including the finding of atrial fibrillation. His CHA2DS2-VASc Score and unadjusted Ischemic Stroke Rate (% per year) is equal to 4.8 % stroke rate/year from a score of 4 (HTN, Aortic Plaque, Age (2)). He was intolerant to ASA 81mg  every other day in the past and she does not think he could tolerate anticoagulation and would not want to be on anticoagulation in  the setting of his advanced age. We did review he would have an increased thromboembolic risk. She plans to discuss this further with family and will make [place] aware if they decide to pursue this in the future.  - given episodes of atrial fibrillation with RVR by monitor and frequent ectopy, will titrate Cardizem CD from 180mg  daily to 240mg  daily. He has not been on BB therapy given oxygen-dependent COPD.   2. HTN - BP was well controlled at 128/60 at the time of his office visit last month. They do have a blood pressure cuff at the house but do not typically check this. I did recommend they check his BP occasionally given the use of Cardizem CD, especially if he develops any dizziness or presyncope.   3. COPD - followed by Dr. Annamaria Boots. He is on 3-4 L [place] at baseline.    COVID-19 Education: The signs and symptoms of COVID-19 were discussed with the patient and how to seek care for testing (follow up with PCP or arrange E-visit).  The importance of social distancing was discussed today.  Time:   Today, I have spent 17 minutes with the patient with telehealth technology discussing the above problems.     Medication Adjustments/Labs and Tests Ordered: Current medicines are reviewed at length with the patient today.  Concerns regarding medicines are outlined above.   Tests Ordered: No orders of the defined types were placed in this encounter.   Medication Changes: Meds ordered this encounter  Medications  . DISCONTD: diltiazem (CARDIZEM CD) 240 MG 24 hr capsule    Sig: Take 1 capsule (240 mg total) by mouth daily.    Dispense:  90 capsule    Refill:  3    Order Specific Question:   Supervising Provider    Answer:   Dorothy Spark [id number]  . DISCONTD: diltiazem (CARDIZEM CD) 240 MG 24 hr capsule    Sig: Take 1 capsule (240 mg total) by mouth daily.    Dispense:  30 capsule    Refill:  0    Only fill 30-days. Will use mail order for 90-days.    Order Specific Question:   Supervising  Provider    Answer:   Dorothy Spark [id number]  . diltiazem (CARDIZEM CD) 240 MG 24 hr capsule    Sig: Take 1 capsule (240 mg total) by mouth daily.    Dispense:  90 capsule    Refill:  3    Order Specific Question:   Supervising Provider    Answer:   Dorothy Spark [id number]    Follow Up:  Either In Person or Virtual in 6 month(s)  Signed, Erma Heritage, PA-C  04/18/2020 4:49 PM    [place]

## 2020-04-18 ENCOUNTER — Telehealth (INDEPENDENT_AMBULATORY_CARE_PROVIDER_SITE_OTHER): Payer: PPO | Admitting: Student

## 2020-04-18 ENCOUNTER — Telehealth: Payer: Self-pay | Admitting: Internal Medicine

## 2020-04-18 ENCOUNTER — Ambulatory Visit: Payer: PPO | Admitting: Primary Care

## 2020-04-18 ENCOUNTER — Encounter: Payer: Self-pay | Admitting: Student

## 2020-04-18 ENCOUNTER — Other Ambulatory Visit: Payer: Self-pay

## 2020-04-18 VITALS — HR 80

## 2020-04-18 DIAGNOSIS — I10 Essential (primary) hypertension: Secondary | ICD-10-CM

## 2020-04-18 DIAGNOSIS — I471 Supraventricular tachycardia: Secondary | ICD-10-CM | POA: Diagnosis not present

## 2020-04-18 DIAGNOSIS — J449 Chronic obstructive pulmonary disease, unspecified: Secondary | ICD-10-CM

## 2020-04-18 DIAGNOSIS — I480 Paroxysmal atrial fibrillation: Secondary | ICD-10-CM | POA: Diagnosis not present

## 2020-04-18 MED ORDER — DILTIAZEM HCL ER COATED BEADS 240 MG PO CP24: 240.0000 mg | ORAL_CAPSULE | Freq: Every day | ORAL | 3 refills | Status: DC

## 2020-04-18 MED ORDER — AZITHROMYCIN 250 MG PO TABS: ORAL_TABLET | ORAL | 0 refills | Status: DC

## 2020-04-18 MED ORDER — DILTIAZEM HCL ER COATED BEADS 240 MG PO CP24: 240.0000 mg | ORAL_CAPSULE | Freq: Every day | ORAL | 0 refills | Status: DC

## 2020-04-18 NOTE — Telephone Encounter (Signed)
Spoke with Nevin Bloodgood, patient's daughter. She verbalized understanding. She stated that the patient's grandchildren bought back name brand Mucinex D which did not contain guaifenesin. I looked up the information for Mucinex D and advised her that it does contain this medication. I also advised her that if she likes, she get the plain Mucinex as a generic at [facility].   I also advised her about the zpak and the instructions. She verbalized understanding.  Will go ahead and call in the RX to [facility].   Nothing further needed at time of call.

## 2020-04-18 NOTE — Telephone Encounter (Signed)
Called and spoke with [name]'s daughter Suares who stated that [name] has been feeling good in the mornings but then around lunchtime he gets to where he is not feeling that well. [name] is weaker and she also states that he is coughing more overnight. Shalaby stated that [name] has been coughing up some phlegm which has a green tint to it.  [name] denies any complaints of fever as Helmick stated yesterday when temp was checked it was 98.6.  Christe also stated that [name] has been taking mucinex but that it does not have any guaifenesin in it. She was wondering if an Rx for guaifenesin could be called in for [name].  Hyder also stated after last OV with [facility], when the last cxr was performed it showed some pockets that Beth stated might be pna still and Beth had told them if [name] was still not feeling well that an abx could be called in.   Dr. Annamaria Boots, please advise on all this for [name] and daughter Deeney what you recommend  Allergies  Allergen Reactions  . Escitalopram Oxalate Palpitations  . Tape Rash and Other (See Comments)    Pulls off skin     Current Outpatient Medications:  .  acetaminophen (TYLENOL) 325 MG tablet, Take 650 mg by mouth every 6 (six) hours as needed for mild pain. , Disp: , Rfl:  .  ascorbic acid (VITAMIN C) 500 MG tablet, Take 500 mg by mouth daily. , Disp: , Rfl:  .  bimatoprost (LUMIGAN) 0.01 % SOLN, Place 1 drop into both eyes at bedtime. , Disp: , Rfl:  .  brimonidine (ALPHAGAN) 0.2 % ophthalmic solution, Place 1 drop into both eyes 2 (two) times daily., Disp: , Rfl:  .  diltiazem (CARDIZEM CD) 180 MG 24 hr capsule, Take 1 capsule (180 mg total) by mouth daily., Disp: 30 capsule, Rfl: 3 .  dorzolamide (TRUSOPT) 2 % ophthalmic solution, Place 1 drop into both eyes 3 (three) times daily. , Disp: , Rfl:  .  doxazosin (CARDURA) 4 MG tablet, Take 2 mg by mouth daily., Disp: , Rfl:  .  feeding supplement, ENSURE ENLIVE, (ENSURE ENLIVE) LIQD, Take 237 mLs by mouth 2 (two) times daily between meals., Disp:  237 mL, Rfl: 12 .  finasteride (PROSCAR) 5 MG tablet, Take 2.5 mg by mouth daily. , Disp: , Rfl:  .  Fluticasone-Umeclidin-Vilant (TRELEGY ELLIPTA) 100-62.5-25 MCG/INH AEPB, Inhale 1 puff into the lungs daily. Rinse mouth after use., Disp: 180 each, Rfl: 3 .  gabapentin (NEURONTIN) 100 MG capsule, Take 100 mg by mouth at bedtime., Disp: , Rfl:  .  guaiFENesin (ROBITUSSIN) 100 MG/5ML SOLN, Take 5 mLs (100 mg total) by mouth every 4 (four) hours as needed for cough or to loosen phlegm., Disp: 236 mL, Rfl: 0 .  latanoprost (XALATAN) 0.005 % ophthalmic solution, Place 1 drop into both eyes at bedtime. , Disp: , Rfl:  .  levalbuterol (XOPENEX) 0.63 MG/3ML nebulizer solution, Take 3 mLs (0.63 mg total) by nebulization every 6 (six) hours as needed for wheezing or shortness of breath., Disp: 3 mL, Rfl: 6 .  MELATONIN PO, Take by mouth. prn, Disp: , Rfl:  .  Multiple Vitamins-Minerals (ICAPS PO), Take 1 tablet by mouth daily. , Disp: , Rfl:  .  pantoprazole (PROTONIX) 20 MG tablet, Take 20 mg by mouth every other day., Disp: , Rfl:  .  predniSONE (DELTASONE) 10 MG tablet, Take 1 tablet daily x 1 week; 1/2 tablet for 1 week, Disp:  11 tablet, Rfl: 0 .  Probiotic Product (ALIGN PO), Take by mouth. Take 1 capsule three times a week by mouth., Disp: , Rfl:  .  vitamin E 100 UNIT capsule, Take 100 Units by mouth. Take by mouth twice a day, Disp: , Rfl:

## 2020-04-18 NOTE — Patient Instructions (Signed)
Medication Instructions:  Your physician has recommended you make the following change in your medication:  Increase Cardizem CD to 240 mg Daily   *If you need a refill on your cardiac medications before your next appointment, please call your pharmacy*   Lab Work: NONE   If you have labs (blood work) drawn today and your tests are completely normal, you will receive your results only by: Marland Kitchen MyChart Message (if you have MyChart) OR . A paper copy in the mail If you have any lab test that is abnormal or we need to change your treatment, we will call you to review the results.   Testing/Procedures: NONE   Follow-Up: At [facility], you and your health needs are our priority.  As part of our continuing mission to provide you with exceptional heart care, we have created designated Provider Care Teams.  These Care Teams include your primary Cardiologist (physician) and Advanced Practice Providers (APPs -  Physician Assistants and Nurse Practitioners) who all work together to provide you with the care you need, when you need it.  We recommend signing up for the patient portal called "MyChart".  Sign up information is provided on this After Visit Summary.  MyChart is used to connect with patients for Virtual Visits (Telemedicine).  Patients are able to view lab/test results, encounter notes, upcoming appointments, etc.  Non-urgent messages can be sent to your provider as well.   To learn more about what you can do with MyChart, go to [email or web address].    Your next appointment:   6 month(s)  The format for your next appointment:   In Person  Provider:   Carlyle Dolly, MD   Other Instructions Thank you for choosing [facility]!

## 2020-04-18 NOTE — Telephone Encounter (Signed)
1) Mucinex IS guaifenecin- That's what is in it.  2) Offer Zpak 250 mg, # 6, 2 today then one daily

## 2020-04-19 ENCOUNTER — Ambulatory Visit: Payer: PPO | Admitting: Primary Care

## 2020-04-30 DIAGNOSIS — M25512 Pain in left shoulder: Secondary | ICD-10-CM | POA: Diagnosis not present

## 2020-05-05 DIAGNOSIS — J432 Centrilobular emphysema: Secondary | ICD-10-CM | POA: Diagnosis not present

## 2020-05-05 DIAGNOSIS — J449 Chronic obstructive pulmonary disease, unspecified: Secondary | ICD-10-CM | POA: Diagnosis not present

## 2020-05-05 DIAGNOSIS — J438 Other emphysema: Secondary | ICD-10-CM | POA: Diagnosis not present

## 2020-05-05 DIAGNOSIS — J849 Interstitial pulmonary disease, unspecified: Secondary | ICD-10-CM | POA: Diagnosis not present

## 2020-05-15 DIAGNOSIS — J449 Chronic obstructive pulmonary disease, unspecified: Secondary | ICD-10-CM | POA: Diagnosis not present

## 2020-05-15 DIAGNOSIS — J432 Centrilobular emphysema: Secondary | ICD-10-CM | POA: Diagnosis not present

## 2020-05-15 DIAGNOSIS — J849 Interstitial pulmonary disease, unspecified: Secondary | ICD-10-CM | POA: Diagnosis not present

## 2020-05-15 DIAGNOSIS — J438 Other emphysema: Secondary | ICD-10-CM | POA: Diagnosis not present

## 2020-05-21 DIAGNOSIS — H60331 Swimmer's ear, right ear: Secondary | ICD-10-CM | POA: Diagnosis not present

## 2020-05-21 DIAGNOSIS — H60541 Acute eczematoid otitis externa, right ear: Secondary | ICD-10-CM | POA: Diagnosis not present

## 2020-05-21 DIAGNOSIS — H6122 Impacted cerumen, left ear: Secondary | ICD-10-CM | POA: Diagnosis not present

## 2020-05-21 DIAGNOSIS — Z974 Presence of external hearing-aid: Secondary | ICD-10-CM | POA: Diagnosis not present

## 2020-05-21 DIAGNOSIS — H903 Sensorineural hearing loss, bilateral: Secondary | ICD-10-CM | POA: Diagnosis not present

## 2020-05-23 ENCOUNTER — Other Ambulatory Visit: Payer: Self-pay | Admitting: Student

## 2020-05-23 MED ORDER — DILTIAZEM HCL ER COATED BEADS 240 MG PO CP24: 240.0000 mg | ORAL_CAPSULE | Freq: Every day | ORAL | 3 refills | Status: AC

## 2020-05-23 NOTE — Telephone Encounter (Signed)
Prescription was sent to mail order pharmacy but it will not arrive before he runs out. Please call in a 30 day supply of Diltiazem 240 mg to the [facility].  Pharmacy number is [phone].

## 2020-05-23 NOTE — Telephone Encounter (Signed)
Refill complete 

## 2020-05-25 DIAGNOSIS — H60331 Swimmer's ear, right ear: Secondary | ICD-10-CM | POA: Diagnosis not present

## 2020-05-25 DIAGNOSIS — H60541 Acute eczematoid otitis externa, right ear: Secondary | ICD-10-CM | POA: Diagnosis not present

## 2020-06-05 DIAGNOSIS — J432 Centrilobular emphysema: Secondary | ICD-10-CM | POA: Diagnosis not present

## 2020-06-05 DIAGNOSIS — J849 Interstitial pulmonary disease, unspecified: Secondary | ICD-10-CM | POA: Diagnosis not present

## 2020-06-05 DIAGNOSIS — J438 Other emphysema: Secondary | ICD-10-CM | POA: Diagnosis not present

## 2020-06-05 DIAGNOSIS — J449 Chronic obstructive pulmonary disease, unspecified: Secondary | ICD-10-CM | POA: Diagnosis not present

## 2020-06-14 ENCOUNTER — Ambulatory Visit: Payer: PPO | Admitting: Internal Medicine

## 2020-06-14 DIAGNOSIS — J849 Interstitial pulmonary disease, unspecified: Secondary | ICD-10-CM | POA: Diagnosis not present

## 2020-06-14 DIAGNOSIS — J432 Centrilobular emphysema: Secondary | ICD-10-CM | POA: Diagnosis not present

## 2020-06-14 DIAGNOSIS — J449 Chronic obstructive pulmonary disease, unspecified: Secondary | ICD-10-CM | POA: Diagnosis not present

## 2020-06-14 DIAGNOSIS — J438 Other emphysema: Secondary | ICD-10-CM | POA: Diagnosis not present

## 2020-07-05 DIAGNOSIS — Z87891 Personal history of nicotine dependence: Secondary | ICD-10-CM | POA: Diagnosis not present

## 2020-07-05 DIAGNOSIS — J432 Centrilobular emphysema: Secondary | ICD-10-CM | POA: Diagnosis not present

## 2020-07-05 DIAGNOSIS — L578 Other skin changes due to chronic exposure to nonionizing radiation: Secondary | ICD-10-CM | POA: Diagnosis not present

## 2020-07-05 DIAGNOSIS — L821 Other seborrheic keratosis: Secondary | ICD-10-CM | POA: Diagnosis not present

## 2020-07-05 DIAGNOSIS — D485 Neoplasm of uncertain behavior of skin: Secondary | ICD-10-CM | POA: Diagnosis not present

## 2020-07-05 DIAGNOSIS — D489 Neoplasm of uncertain behavior, unspecified: Secondary | ICD-10-CM | POA: Diagnosis not present

## 2020-07-05 DIAGNOSIS — C4441 Basal cell carcinoma of skin of scalp and neck: Secondary | ICD-10-CM | POA: Diagnosis not present

## 2020-07-05 DIAGNOSIS — L568 Other specified acute skin changes due to ultraviolet radiation: Secondary | ICD-10-CM | POA: Diagnosis not present

## 2020-07-05 DIAGNOSIS — L814 Other melanin hyperpigmentation: Secondary | ICD-10-CM | POA: Diagnosis not present

## 2020-07-05 DIAGNOSIS — Z85828 Personal history of other malignant neoplasm of skin: Secondary | ICD-10-CM | POA: Diagnosis not present

## 2020-07-05 DIAGNOSIS — J438 Other emphysema: Secondary | ICD-10-CM | POA: Diagnosis not present

## 2020-07-05 DIAGNOSIS — Z1283 Encounter for screening for malignant neoplasm of skin: Secondary | ICD-10-CM | POA: Diagnosis not present

## 2020-07-05 DIAGNOSIS — J849 Interstitial pulmonary disease, unspecified: Secondary | ICD-10-CM | POA: Diagnosis not present

## 2020-07-05 DIAGNOSIS — L570 Actinic keratosis: Secondary | ICD-10-CM | POA: Diagnosis not present

## 2020-07-05 DIAGNOSIS — J449 Chronic obstructive pulmonary disease, unspecified: Secondary | ICD-10-CM | POA: Diagnosis not present

## 2020-07-15 DIAGNOSIS — J449 Chronic obstructive pulmonary disease, unspecified: Secondary | ICD-10-CM | POA: Diagnosis not present

## 2020-07-15 DIAGNOSIS — J438 Other emphysema: Secondary | ICD-10-CM | POA: Diagnosis not present

## 2020-07-15 DIAGNOSIS — J432 Centrilobular emphysema: Secondary | ICD-10-CM | POA: Diagnosis not present

## 2020-07-15 DIAGNOSIS — J849 Interstitial pulmonary disease, unspecified: Secondary | ICD-10-CM | POA: Diagnosis not present

## 2020-07-25 DIAGNOSIS — Z974 Presence of external hearing-aid: Secondary | ICD-10-CM | POA: Diagnosis not present

## 2020-07-25 DIAGNOSIS — H903 Sensorineural hearing loss, bilateral: Secondary | ICD-10-CM | POA: Diagnosis not present

## 2020-07-25 DIAGNOSIS — H938X2 Other specified disorders of left ear: Secondary | ICD-10-CM | POA: Diagnosis not present

## 2020-07-25 DIAGNOSIS — H6121 Impacted cerumen, right ear: Secondary | ICD-10-CM | POA: Diagnosis not present

## 2020-07-26 ENCOUNTER — Ambulatory Visit: Payer: PPO | Admitting: Pulmonary Disease

## 2020-07-26 ENCOUNTER — Other Ambulatory Visit: Payer: Self-pay

## 2020-07-26 ENCOUNTER — Encounter: Payer: Self-pay | Admitting: Pulmonary Disease

## 2020-07-26 VITALS — BP 120/78 | HR 66 | Temp 98.3°F | Ht 67.0 in | Wt 141.2 lb

## 2020-07-26 DIAGNOSIS — J9611 Chronic respiratory failure with hypoxia: Secondary | ICD-10-CM

## 2020-07-26 DIAGNOSIS — J849 Interstitial pulmonary disease, unspecified: Secondary | ICD-10-CM | POA: Diagnosis not present

## 2020-07-26 DIAGNOSIS — Z7189 Other specified counseling: Secondary | ICD-10-CM | POA: Diagnosis not present

## 2020-07-26 DIAGNOSIS — J449 Chronic obstructive pulmonary disease, unspecified: Secondary | ICD-10-CM | POA: Diagnosis not present

## 2020-07-26 NOTE — Patient Instructions (Addendum)
You were seen today by Lauraine Rinne, NP  for:   1. COPD mixed type ([place])  Trelegy Ellipta  >>> 1 puff daily in the morning >>>rinse mouth out after use  >>> This inhaler contains 3 medications that help manage her respiratory status, contact our office if you cannot afford this medication or unable to remain on this medication  Note your daily symptoms > remember "red flags" for COPD:   >>>Increase in cough >>>increase in sputum production >>>increase in shortness of breath or activity  intolerance.   If you notice these symptoms, please call the office to be seen.    2. Chronic respiratory failure with hypoxia ([place])  - Ambulatory referral to Hospice  Continue oxygen therapy as prescribed  - 4L continuous needed  >>>maintain oxygen saturations greater than 88 percent  >>>if unable to maintain oxygen saturations please contact the office  >>>do not smoke with oxygen  >>>can use nasal saline gel or nasal saline rinses to moisturize nose if oxygen causes dryness  3. Interstitial lung disease ([place])    We recommend today:  Orders Placed This Encounter  Procedures  . Ambulatory referral to Hospice    Referral Priority:   Urgent    Referral Type:   Consultation    Referral Reason:   Symptom Managment    Requested Specialty:   Hospice Services    Number of Visits Requested:   1   Orders Placed This Encounter  Procedures  . Ambulatory referral to Hospice   No orders of the defined types were placed in this encounter.   Follow Up:    Return in about 3 months (around 10/26/2020), or if symptoms worsen or fail to improve, for Follow up with Dr. Annamaria Boots.   Please do your part to reduce the spread of COVID-19:      Reduce your risk of any infection  and COVID19 by using the similar precautions used for avoiding the common cold or flu:  Marland Kitchen Wash your hands often with soap and warm water for at least 20 seconds.  If soap and water are not readily available, use an  alcohol-based hand sanitizer with at least 60% alcohol.  . If coughing or sneezing, cover your mouth and nose by coughing or sneezing into the elbow areas of your shirt or coat, into a tissue or into your sleeve (not your hands). Langley Gauss A MASK when in public  . Avoid shaking hands with others and consider head nods or verbal greetings only. . Avoid touching your eyes, nose, or mouth with unwashed hands.  . Avoid close contact with people who are sick. . Avoid places or events with large numbers of people in one location, like concerts or sporting events. . If you have some symptoms but not all symptoms, continue to monitor at home and seek medical attention if your symptoms worsen. . If you are having a medical emergency, call 911.   [place] / e-Visit: [email or web address]         [facility]: [facility]: [phone]                   [facility]: [phone]     It is flu season:   >>> Best ways to protect herself from the flu: Receive the yearly flu vaccine, practice good hand hygiene washing with soap and also using hand sanitizer when available, eat a nutritious meals,  get adequate rest, hydrate appropriately   Please contact the office if your symptoms worsen or you have concerns that you are not improving.   Thank you for choosing [facility] for your healthcare, and for allowing [place] to partner with you on your healthcare journey. I am thankful to be able to provide care to you today.   Wyn Quaker FNP-C

## 2020-07-26 NOTE — Assessment & Plan Note (Addendum)
Discussion: Reviewed options of switching to [place] or remaining with [employer] [facility].  After further discussion patient has agreed to being referred to hospice therapy.  This would mean that hospice will take over oxygen management as well as continue to work with [employer].  Plan: Referral to hospice today Continue oxygen therapy Offered [employer] tank for patient be left on continuous flow as he has another appointment coming up Patient's daughter declined and stated they have a tank in the car Patient requires 4 L of O2 continuous 24/7 We do not recommend the patient uses his portable oxygen concentrator

## 2020-07-26 NOTE — Assessment & Plan Note (Signed)
Discussed options with patient and daughter today  Reviewed palliative care versus hospice.  Patient agrees to hospice therapy at this time.  I have personally contacted Margaretmary Eddy, RN with hospice to coordinate referral and reach out to the patient.  Plan: Referral to hospice therapy today

## 2020-07-26 NOTE — Assessment & Plan Note (Signed)
We will continue to clinically monitor Referred to hospice therapy today Continue oxygen therapy at this time

## 2020-07-26 NOTE — Assessment & Plan Note (Signed)
Plan: Continue Trelegy Ellipta Referred to hospice therapy today Continue oxygen therapy

## 2020-07-26 NOTE — Progress Notes (Signed)
@Patient  ID: [name], male    DOB: 06/24/1927, 86 y.o.   MRN: [id number]  Chief Complaint  Patient presents with   Follow-up    pt wants to change dme for oygen    Referring provider: Christain Sacramento, MD  HPI:  86 year old male former smoker followed in our office for asthma/COPD, chronic hypoxemic respiratory failure, UIP  PMH: Allergic rhinitis, GERD Smoker/ Smoking History: Former smoker.  Quit 1993.  100-pack-year smoking history. Maintenance:   Pt of: Dr. Annamaria Boots  07/26/2020  - Visit   86 year old male former smoker followed in our office for asthma/COPD and chronic hypoxemic respiratory failure.  Patient was last seen in our office in April/2021.  He was seen by EW NP.  Patient was instructed to remain on Trelegy Ellipta, continue oxygen therapy, continue deep breathing exercises and incentive spirometer.  Patient is status post hospitalization in March/2021 for pneumonia.  Patient last saw Dr. Annamaria Boots in January/2021.  That was a virtual visit  Patient presenting today with his daughter.  They would like to switch DME companies to [employer].  They have had a poor experience with [facility].  We will discuss and evaluate this today.  Patient and patient's daughter report today that patient has been dealing with significant amounts of grief since losing his spouse in March/2021.  Patient is also had ongoing bouts with pneumonia.  Patient's daughter is concerned that patient's oxygen needs and breathing status has worsened.  We will discuss and evaluate this.  Patient has not established with palliative care or hospice therapies at this time.  They are open to discussing this today  Patient presented to our office today on 4 L pulsed via POC.  Patient prefers to use a POC since he paid for this out-of-pocket.  Unfortunately even when at 4 to 5 L pulsed patient's oxygen levels are in the low 80s.  We will discuss and review this today.  1 patient was placed on 4 L  continuous patient's oxygen levels were 90%.     Questionaires / Pulmonary Flowsheets:   ACT:  No flowsheet data found.  MMRC: mMRC Dyspnea Scale mMRC Score  07/26/2020 4    Epworth:  No flowsheet data found.  Tests:   Walk test 08/01/2019-  pt dropped 86% on O2 2L HR 110, resolved to 90% on 3L O2, HR 83  02/27/2020-CTA chest-extensive advanced intralobular emphysematous changes with underlying diffuse groundglass opacities and interstitial thickening at both lung bases which could be due to concomitant inflammatory/infectious etiology  03/22/2020-questionable increasing opacity left lung base which could reflect some atelectatic change or superimposed consolidation, difficult to fully assess given the advanced scarring and fibrotic changes in both lungs  02/29/2020-echocardiogram-LV ejection fraction 70 to 07%, RV systolic function is normal, mildly elevated pulmonary artery systolic pressure pulmonary artery systolic pressure is 30  FENO:  No results found for: NITRICOXIDE  PFT: No flowsheet data found.  WALK:  SIX MIN WALK 08/01/2019  Supplimental Oxygen during Test? (L/min) Yes  O2 Flow Rate 2  Type Continuous  Tech Comments: Pt desaturated on O2 2L while ambulating. Titrated to O2 3L and pt maintained 90%. Pt reported shortness of breath; however, resolved to 97% on O2 3L.    Imaging: No results found.  Lab Results:  CBC    Component Value Date/Time   WBC 16.1 (H) 02/29/2020 0537   RBC 5.18 02/29/2020 0537   HGB 16.1 02/29/2020 0537   HCT 48.4 02/29/2020 0537  PLT 258 02/29/2020 0537   MCV 93.4 02/29/2020 0537   MCH 31.1 02/29/2020 0537   MCHC 33.3 02/29/2020 0537   RDW 13.5 02/29/2020 0537   LYMPHSABS 0.9 02/29/2020 0537   MONOABS 0.4 02/29/2020 0537   EOSABS 0.0 02/29/2020 0537   BASOSABS 0.0 02/29/2020 0537    BMET    Component Value Date/Time   NA 138 02/28/2020 0614   K 4.2 02/28/2020 0614   CL 105 02/28/2020 0614   CO2 23 02/28/2020 0614    GLUCOSE 203 (H) 02/28/2020 0614   BUN 13 02/28/2020 0614   CREATININE 0.82 02/28/2020 0614   CALCIUM 8.6 (L) 02/28/2020 0614   GFRNONAA >60 02/28/2020 0614   GFRAA >60 02/28/2020 0614    BNP    Component Value Date/Time   BNP 84.0 02/27/2020 2204    ProBNP    Component Value Date/Time   PROBNP 222.2 03/12/2014 1938    Specialty Problems      Pulmonary Problems   ALLERGIC RHINITIS    Qualifier: Diagnosis of  By: Annamaria Boots MD, Clinton D       COPD mixed type ([place])           Lung nodule    Tiny LLL nodule incidentally on chest CT January 06, 2011       CAP (community acquired pneumonia)    Acute Hosp 03/2014 CAP 12/19/14- RLL pneumonia      Chronic respiratory failure with hypoxia ([place])    Walk test 08/01/2019-  pt dropped 86% on O2 2L HR 110, resolved to 90% on 3L O2, HR 83      COPD exacerbation ([facility])   Interstitial lung disease ([place])    Noted on chest x-ray 09/12/2014         Allergies  Allergen Reactions   Escitalopram Oxalate Palpitations   Tape Rash and Other (See Comments)    Pulls off skin    Immunization History  Administered Date(s) Administered   Fluad Quad(high Dose 65+) 08/01/2019   H1N1 01/11/2009   Influenza Split 09/08/2011, 08/08/2012, 09/06/2013, 10/19/2014   Influenza, High Dose Seasonal PF 09/10/2016, 08/20/2017, 09/02/2018   Influenza,inj,Quad PF,6+ Mos 09/17/2015   Influenza-Unspecified 09/17/2015, 09/10/2016   PFIZER SARS-COV-2 Vaccination 01/02/2020, 01/23/2020   Pneumococcal Conjugate-13 04/06/2014, 09/10/2016   Pneumococcal Polysaccharide-23 04/06/2014, 09/10/2016   Tdap 06/29/2019, 06/30/2019   Zoster Recombinat (Shingrix) 02/18/2018, 02/22/2018, 05/11/2018, 05/26/2018    Past Medical History:  Diagnosis Date   Allergic rhinitis, cause unspecified    Asthma    BPH (benign prostatic hyperplasia)    Chronic airway obstruction, not elsewhere classified    Esophageal reflux    Macular degeneration      Tobacco History: Social History   Tobacco Use  Smoking Status Former Smoker   Packs/day: 2.00   Years: 50.00   Pack years: 100.00   Types: Cigarettes   Quit date: 12/09/1991   Years since quitting: 28.6  Smokeless Tobacco Never Used   Counseling given: Yes   Continue to not smoke  Outpatient Encounter Medications as of 07/26/2020  Medication Sig   acetaminophen (TYLENOL) 325 MG tablet Take 650 mg by mouth every 6 (six) hours as needed for mild pain.    ascorbic acid (VITAMIN C) 500 MG tablet Take 500 mg by mouth daily.    azithromycin (ZITHROMAX) 250 MG tablet Take 2 tablets on first day, then 1 tablet daily until finished.   bimatoprost (LUMIGAN) 0.01 % SOLN Place 1 drop into both eyes at bedtime.  brimonidine (ALPHAGAN) 0.2 % ophthalmic solution Place 1 drop into both eyes 2 (two) times daily.   diltiazem (CARDIZEM CD) 240 MG 24 hr capsule Take 1 capsule (240 mg total) by mouth daily.   diltiazem (TIAZAC) 240 MG 24 hr capsule Take by mouth.   dorzolamide (TRUSOPT) 2 % ophthalmic solution Place 1 drop into both eyes 3 (three) times daily.    doxazosin (CARDURA) 4 MG tablet Take 2 mg by mouth daily.   feeding supplement, ENSURE ENLIVE, (ENSURE ENLIVE) LIQD Take 237 mLs by mouth 2 (two) times daily between meals.   finasteride (PROSCAR) 5 MG tablet Take 2.5 mg by mouth daily.    Fluticasone-Umeclidin-Vilant (TRELEGY ELLIPTA) 100-62.5-25 MCG/INH AEPB Inhale 1 puff into the lungs daily. Rinse mouth after use.   gabapentin (NEURONTIN) 100 MG capsule Take 100 mg by mouth at bedtime.   guaiFENesin (ROBITUSSIN) 100 MG/5ML SOLN Take 5 mLs (100 mg total) by mouth every 4 (four) hours as needed for cough or to loosen phlegm.   latanoprost (XALATAN) 0.005 % ophthalmic solution Place 1 drop into both eyes at bedtime.    levalbuterol (XOPENEX) 0.63 MG/3ML nebulizer solution Take 3 mLs (0.63 mg total) by nebulization every 6 (six) hours as needed for wheezing or  shortness of breath.   MELATONIN PO Take by mouth. prn   Multiple Vitamins-Minerals (ICAPS PO) Take 1 tablet by mouth daily.    pantoprazole (PROTONIX) 20 MG tablet Take 20 mg by mouth every other day.   predniSONE (DELTASONE) 10 MG tablet Take 1 tablet daily x 1 week; 1/2 tablet for 1 week   Probiotic Product (ALIGN PO) Take by mouth. Take 1 capsule three times a week by mouth.   vitamin E 100 UNIT capsule Take 100 Units by mouth. Take by mouth twice a day   No facility-administered encounter medications on file as of 07/26/2020.     Review of Systems  Review of Systems  Constitutional: Positive for fatigue. Negative for activity change, chills, fever and unexpected weight change.  HENT: Positive for hearing loss. Negative for postnasal drip, rhinorrhea, sinus pressure, sinus pain and sore throat.   Eyes: Negative.   Respiratory: Positive for shortness of breath. Negative for cough and wheezing.   Cardiovascular: Negative for chest pain and palpitations.  Gastrointestinal: Positive for vomiting.  Endocrine: Negative.   Genitourinary: Negative.   Musculoskeletal: Negative.   Skin: Negative.   Neurological: Negative for dizziness and headaches.  Psychiatric/Behavioral: Negative.  Negative for dysphoric mood. The patient is not nervous/anxious.   All other systems reviewed and are negative.    Physical Exam  BP 120/78    Pulse 66    Temp 98.3 F (36.8 C) (Oral)    Ht 5\' 7"  (1.702 m)    Wt 141 lb 3.2 oz (64 kg)    SpO2 92%    BMI 22.12 kg/m   Wt Readings from Last 5 Encounters:  07/26/20 141 lb 3.2 oz (64 kg)  03/22/20 144 lb 6.4 oz (65.5 kg)  03/16/20 142 lb (64.4 kg)  03/02/20 145 lb (65.8 kg)  02/28/20 142 lb 10.2 oz (64.7 kg)    BMI Readings from Last 5 Encounters:  07/26/20 22.12 kg/m  03/22/20 23.31 kg/m  03/16/20 22.92 kg/m  03/02/20 23.40 kg/m  02/28/20 23.74 kg/m     Physical Exam Vitals and nursing note reviewed.  Constitutional:      General:  He is not in acute distress.    Appearance: Normal appearance. He is normal  weight.     Comments: Chronically ill elderly male  HENT:     Head: Normocephalic and atraumatic.     Right Ear: Hearing and external ear normal.     Left Ear: Hearing and external ear normal.     Ears:     Comments: HOH    Nose: Nose normal. No mucosal edema or rhinorrhea.     Right Turbinates: Not enlarged.     Left Turbinates: Not enlarged.     Mouth/Throat:     Mouth: Mucous membranes are dry.     Pharynx: Oropharynx is clear. No oropharyngeal exudate.  Eyes:     Pupils: Pupils are equal, round, and reactive to light.  Cardiovascular:     Rate and Rhythm: Normal rate and regular rhythm.     Pulses: Normal pulses.     Heart sounds: Normal heart sounds. No murmur heard.   Pulmonary:     Effort: Pulmonary effort is normal.     Breath sounds: Rales present. No decreased breath sounds or wheezing.  Musculoskeletal:     Cervical back: Normal range of motion.     Right lower leg: No edema.     Left lower leg: No edema.  Lymphadenopathy:     Cervical: No cervical adenopathy.  Skin:    General: Skin is warm and dry.     Capillary Refill: Capillary refill takes less than 2 seconds.     Findings: No erythema or rash.  Neurological:     General: No focal deficit present.     Mental Status: He is alert and oriented to person, place, and time.     Motor: Weakness present.     Coordination: Coordination normal.     Gait: Gait abnormal.  Psychiatric:        Mood and Affect: Mood normal.        Behavior: Behavior normal. Behavior is cooperative.        Thought Content: Thought content normal.        Judgment: Judgment normal.       Assessment & Plan:   Interstitial lung disease We will continue to clinically monitor Referred to hospice therapy today Continue oxygen therapy at this time  COPD mixed type [facility]) Plan: Continue Trelegy Ellipta Referred to hospice therapy today Continue oxygen  therapy  Chronic respiratory failure with hypoxia [facility]) Discussion: Reviewed options of switching to [employer] or remaining with [facility].  After further discussion patient has agreed to being referred to hospice therapy.  This would mean that hospice will take over oxygen management as well as continue to work with [facility].  Plan: Referral to hospice today Continue oxygen therapy Offered [facility] tank for patient be left on continuous flow as he has another appointment coming up Patient's daughter declined and stated they have a tank in the car Patient requires 4 L of O2 continuous 24/7 We do not recommend the patient uses his portable oxygen concentrator  Complex care coordination Discussed options with patient and daughter today  Reviewed palliative care versus hospice.  Patient agrees to hospice therapy at this time.  I have personally contacted Margaretmary Eddy, RN with hospice to coordinate referral and reach out to the patient.  Plan: Referral to hospice therapy today    Return in about 3 months (around 10/26/2020), or if symptoms worsen or fail to improve, for Follow up with Dr. Annamaria Boots.   Lauraine Rinne, NP 07/26/2020   This appointment required 42 minutes of patient  care (this includes precharting, chart review, review of results, face-to-face care, etc.).

## 2020-07-30 ENCOUNTER — Telehealth: Payer: Self-pay | Admitting: Internal Medicine

## 2020-07-30 ENCOUNTER — Telehealth: Payer: Self-pay | Admitting: Pulmonary Disease

## 2020-07-30 NOTE — Telephone Encounter (Signed)
Spoke with Venia Minks, patient's daughter, listed on DPR.  I was advised that the patient no longer needs the sample of the Trelegy at this time as the patient/family have chosen to go with palliative care instead of Hospice until at least the end of September.  We will be advised if he chooses to go with Hospice care as Trelegy is not on the formulary for hospice and an alternative would have to be chosen.  Nothing further needed.

## 2020-07-30 NOTE — Telephone Encounter (Signed)
LMTCB for [name] with Hospice

## 2020-08-01 ENCOUNTER — Telehealth: Payer: Self-pay | Admitting: Nurse Practitioner

## 2020-08-01 NOTE — Telephone Encounter (Signed)
Scheduled [facility] Palliative visit for 08-09-20 at 1:00.

## 2020-08-02 DIAGNOSIS — H903 Sensorineural hearing loss, bilateral: Secondary | ICD-10-CM | POA: Diagnosis not present

## 2020-08-05 DIAGNOSIS — J449 Chronic obstructive pulmonary disease, unspecified: Secondary | ICD-10-CM | POA: Diagnosis not present

## 2020-08-05 DIAGNOSIS — J438 Other emphysema: Secondary | ICD-10-CM | POA: Diagnosis not present

## 2020-08-05 DIAGNOSIS — J849 Interstitial pulmonary disease, unspecified: Secondary | ICD-10-CM | POA: Diagnosis not present

## 2020-08-05 DIAGNOSIS — J432 Centrilobular emphysema: Secondary | ICD-10-CM | POA: Diagnosis not present

## 2020-08-07 NOTE — Telephone Encounter (Signed)
Called and spoke to Jules Husbands, RN, with home health. Per pts chart the pt already has a referral for palliative care. Adam advised I call [facility] to verify the referral went through. I called [facility] at [phone] option 2. I spoke to [facility] and was advised the referral went through and the pt has a visit scheduled for 9/2. Nothing further needed at this time.

## 2020-08-09 ENCOUNTER — Encounter: Payer: Self-pay | Admitting: Nurse Practitioner

## 2020-08-09 ENCOUNTER — Other Ambulatory Visit: Payer: PPO | Admitting: Nurse Practitioner

## 2020-08-09 ENCOUNTER — Other Ambulatory Visit: Payer: PPO

## 2020-08-09 ENCOUNTER — Other Ambulatory Visit: Payer: Self-pay

## 2020-08-09 DIAGNOSIS — J449 Chronic obstructive pulmonary disease, unspecified: Secondary | ICD-10-CM

## 2020-08-09 DIAGNOSIS — Z515 Encounter for palliative care: Secondary | ICD-10-CM | POA: Diagnosis not present

## 2020-08-09 NOTE — Progress Notes (Signed)
[place] Consult Note Telephone: [phone]  Fax: [phone]  PATIENT NAME: [name] [id number] [phone] (home)  DOB: Jun 10, 1927 MRN: [id number]  PRIMARY CARE PROVIDER:    Christain Sacramento, MD,  [address] [place] [address] [phone]  REFERRING PROVIDER:   Christain Sacramento, MD [address] [place],   [address] N/A  RESPONSIBLE PARTY:   Extended Emergency Contact Information Primary Emergency Contact: [place] of [place] Phone: [phone] Relation: Daughter Secondary Emergency Contact: Meints,Van  [place] Mobile Phone: [phone] Relation: Son  I met face to face with patient and family in home.  ASSESSMENT AND RECOMMENDATIONS:   1. Advance Care Planning/Goals of Care: Patient daughter Koors and son Lucianne Lei present at today's meeting. [facility] social worker Brayton Layman was also in attendance. Patient is very hard of hearing, per family has about 90% hearing loss in both ears. Most discussion was done with daughter and son.  Directives: Patient code status is DNR. Family endorsed having a copy of DNR form in the house. Goals of care: Family endorsed patient's goal of care to be comfort and dying in his own home.  2. Symptom Management/Cognitive / Functional decline: Patient with asthma and end stage COPD. He is currently dependent on supplemental oxygen, uses 3-4L at base line but now continually needs 4L to maintain saturation above 88%. Family report recurrent hospitalization for pneumonia in the last 4-5 years, last treated last month. Patient has ongoing activity intolerance, family report inability to walk more than 75ft without being dyspneic. Patient is almost blind from macular degeneration, family report he only sees shadows. He however able to ambulate within his house without assistance or assistive device, takes showers and  feed himself. Patient was initially referred for Hospice care on 07/27/20, he was evaluated and deemed ineligible for the program because he is on Trelegy Elipta inhaler which is not on Hospice formulary and a planned Moh surgery for skin cancer. Family at the time declined switching to formulary alternative. Today, patient and family agreed to be reevaluated for Hospice services. Patient's daughter is concerned that patient's oxygen needs and breathing status has worsened. Patient agreed to accept alternative med on Hospice formulary as deemed fit and his moh surgery has been canceled per patient's daughter. Family report that patient has 190 doses of Trelegy Elipta at home that is already paid for.  3. 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 He has 4 children who all live close to him and take turns caring for him. Patient lost his wife of 52 years in March of this year. His daughter Morissette who is a former Systems developer is his main caregiver.  I spent 47 minutes providing this consultation, from 1:30pm to 2:17pm. More than 50% of the time in this consultation was spent coordinating communication.   HISTORY OF PRESENT ILLNESS:  [name] is a 86 y.o. year old male with multiple medical problems including Asthma, COPD with chronic hypoxemia respiratory failure, skin cancer (BCC & SCC).  Palliative Care was asked to follow this patient by consultation request of Christain Sacramento, MD to help address advance care planning and goals of care. This is an inital visit.  CODE STATUS: DNR  PPS: 40%  HOSPICE ELIGIBILITY/DIAGNOSIS: Patient deem eligible for Hospice care. Prior order valid  PAST MEDICAL HISTORY:  Past Medical History:  Diagnosis  Date   Allergic rhinitis, cause unspecified    Asthma    BPH (benign prostatic hyperplasia)    Chronic airway obstruction, not elsewhere classified    Esophageal reflux    Macular  degeneration     SOCIAL HX:  Social History   Tobacco Use   Smoking status: Former Smoker    Packs/day: 2.00    Years: 50.00    Pack years: 100.00    Types: Cigarettes    Quit date: 12/09/1991    Years since quitting: 28.6   Smokeless tobacco: Never Used  Substance Use Topics   Alcohol use: Yes    Comment: occasional glass of red wine   FAMILY HX:  Family History  Problem Relation Age of Onset   Cancer Other        both parents    ALLERGIES:  Allergies  Allergen Reactions   Escitalopram Oxalate Palpitations   Tape Rash and Other (See Comments)    Pulls off skin     PERTINENT MEDICATIONS:  Outpatient Encounter Medications as of 08/09/2020  Medication Sig   acetaminophen (TYLENOL) 325 MG tablet Take 650 mg by mouth every 6 (six) hours as needed for mild pain.    ascorbic acid (VITAMIN C) 500 MG tablet Take 500 mg by mouth daily.    azithromycin (ZITHROMAX) 250 MG tablet Take 2 tablets on first day, then 1 tablet daily until finished.   bimatoprost (LUMIGAN) 0.01 % SOLN Place 1 drop into both eyes at bedtime.    brimonidine (ALPHAGAN) 0.2 % ophthalmic solution Place 1 drop into both eyes 2 (two) times daily.   diltiazem (CARDIZEM CD) 240 MG 24 hr capsule Take 1 capsule (240 mg total) by mouth daily.   diltiazem (TIAZAC) 240 MG 24 hr capsule Take by mouth.   dorzolamide (TRUSOPT) 2 % ophthalmic solution Place 1 drop into both eyes 3 (three) times daily.    doxazosin (CARDURA) 4 MG tablet Take 2 mg by mouth daily.   feeding supplement, ENSURE ENLIVE, (ENSURE ENLIVE) LIQD Take 237 mLs by mouth 2 (two) times daily between meals.   finasteride (PROSCAR) 5 MG tablet Take 2.5 mg by mouth daily.    Fluticasone-Umeclidin-Vilant (TRELEGY ELLIPTA) 100-62.5-25 MCG/INH AEPB Inhale 1 puff into the lungs daily. Rinse mouth after use.   gabapentin (NEURONTIN) 100 MG capsule Take 100 mg by mouth at bedtime.   guaiFENesin (ROBITUSSIN) 100 MG/5ML SOLN Take 5 mLs (100 mg  total) by mouth every 4 (four) hours as needed for cough or to loosen phlegm.   latanoprost (XALATAN) 0.005 % ophthalmic solution Place 1 drop into both eyes at bedtime.    levalbuterol (XOPENEX) 0.63 MG/3ML nebulizer solution Take 3 mLs (0.63 mg total) by nebulization every 6 (six) hours as needed for wheezing or shortness of breath.   MELATONIN PO Take by mouth. prn   Multiple Vitamins-Minerals (ICAPS PO) Take 1 tablet by mouth daily.    pantoprazole (PROTONIX) 20 MG tablet Take 20 mg by mouth every other day.   predniSONE (DELTASONE) 10 MG tablet Take 1 tablet daily x 1 week; 1/2 tablet for 1 week   Probiotic Product (ALIGN PO) Take by mouth. Take 1 capsule three times a week by mouth.   vitamin E 100 UNIT capsule Take 100 Units by mouth. Take by mouth twice a day   No facility-administered encounter medications on file as of 08/09/2020.    PHYSICAL EXAM / ROS:   Current and past weights: 141lbs, BMI 22.12kg/m2 General: chronically ill  appearing and frail appearing, thin, NAD Cardiovascular: denied chest pain reported, 2+ edema  Pulmonary: no cough, no increased SOB, rales present Abdomen: appetite fair, denied constipation, continent of bowel GU: denies dysuria, continent of urine MSK:  Unsteady gait Skin: scartered dried lesion on exposed skin and scalp Neurological: weakness, but otherwise nonfocal  Jari Favre, DNP, AGPCNP-BC

## 2020-08-10 ENCOUNTER — Other Ambulatory Visit: Payer: Self-pay

## 2020-08-10 NOTE — Progress Notes (Signed)
COMMUNITY PALLIATIVE CARE SW NOTE  PATIENT NAME: [name] DOB: [birth year] MRN: [id number]  PRIMARY CARE PROVIDER: Christain Sacramento, MD  RESPONSIBLE PARTY:  Acct ID - Guarantor Home Phone Work Phone Relationship Acct Type  [id number] - Barbaro,[phone]  Self P/F     [address], [place], [place] [address]     PLAN OF CARE and INTERVENTIONS:             1. GOALS OF CARE/ ADVANCE CARE PLANNING:  Goals of care is for patient to remain in his home and receive hospice care. Patient is a DNR.  2. SOCIAL/EMOTIONAL/SPIRITUAL ASSESSMENT/ INTERVENTIONS:  SW and NP-Q. Nbemena completed face-to-face visit with patient at his home, where he was present with his daughter-Paula and son-Van. Education was provided about the palliative program, role in care and visit frequency. Her daughter-Theotis shared that she felt her dad would benefit more from hospice services. She stated that patient did not qualify for hospice Trilogy medication and an impending surgery. [place] advised that they were not especially concern with keeping the Trilogy as she feels her dad is not benefiting from it. She stated that they were open to discontinuing the medication or changing it to something that is part of hospice formulary and they have put the surgery on hold for a few months.  The NP advised that she will follow-up and order another hospice consult. Patient walks independently in his home. He is on continuous o2 at 4L. Patient is hard of hearing and wears hearing aides. He also has macular degeneration.  Patient is widowed and the father of four children with his sons living on same road as he does.Patient is a retired Systems developer. SW provided education, supportive presence, assessment of needs and comfort of patient, coping of PCG, active and reflective listening and reassurance of support. SW will provided ongoing assessment of psychosocial needs of patient. 3. PATIENT/CAREGIVER EDUCATION/ COPING:  Patient is alert and  oriented x3. He is hard of hearing and has vision issues. He has a supportive network of friends and family that check on him daily. Patient is a great historian. 4. PERSONAL EMERGENCY PLAN:  911 can be activated for emergencies.  5. COMMUNITY RESOURCES COORDINATION/ HEALTH CARE NAVIGATION:  Patient/family would like patient to receive hospice services.  6. FINANCIAL/LEGAL CONCERNS/INTERVENTIONS:  None     SOCIAL HX:  Social History   Tobacco Use  . Smoking status: Former Smoker    Packs/day: 2.00    Years: 50.00    Pack years: 100.00    Types: Cigarettes    Quit date: 12/09/1991    Years since quitting: 28.6  . Smokeless tobacco: Never Used  Substance Use Topics  . Alcohol use: Yes    Comment: occasional glass of red wine    CODE STATUS: DNR ADVANCED DIRECTIVES: Yes MOST FORM COMPLETE:  No HOSPICE EDUCATION PROVIDED: No  PPS: Patient is alert and oriented x3. He ambulates independently in his home. His appetite is good.   Duration of visit and documentation: 60 minutes      Katheren Puller, LCSW

## 2020-08-15 DIAGNOSIS — J849 Interstitial pulmonary disease, unspecified: Secondary | ICD-10-CM | POA: Diagnosis not present

## 2020-08-15 DIAGNOSIS — J449 Chronic obstructive pulmonary disease, unspecified: Secondary | ICD-10-CM | POA: Diagnosis not present

## 2020-08-15 DIAGNOSIS — J432 Centrilobular emphysema: Secondary | ICD-10-CM | POA: Diagnosis not present

## 2020-08-15 DIAGNOSIS — J438 Other emphysema: Secondary | ICD-10-CM | POA: Diagnosis not present

## 2020-08-16 ENCOUNTER — Telehealth: Payer: Self-pay | Admitting: Pulmonary Disease

## 2020-08-16 NOTE — Telephone Encounter (Signed)
Called and spoke with [name] letting her know the info stated by Aaron Edelman and she verbalized understanding. Nothing further needed.

## 2020-08-16 NOTE — Telephone Encounter (Signed)
If the patient is agreeable to home hospice I am fine being the attending.  I would prefer for controlled substances to be managed by the hospice MD not by our office.  Wyn Quaker, FNP

## 2020-08-16 NOTE — Telephone Encounter (Signed)
Called and spoke with [name] from [facility] stating to her that Aaron Edelman was out of the office until 9/13 but once he returned to the office and saw this message, after he responded we would call her back with his response. Latasha verbalized understanding.  Aaron Edelman, please advise if you are okay with being pt's hospice attending and also if you agree that pt is terminally ill.

## 2020-08-21 DIAGNOSIS — Z0000 Encounter for general adult medical examination without abnormal findings: Secondary | ICD-10-CM | POA: Diagnosis not present

## 2020-08-21 DIAGNOSIS — N400 Enlarged prostate without lower urinary tract symptoms: Secondary | ICD-10-CM | POA: Diagnosis not present

## 2020-08-21 DIAGNOSIS — K219 Gastro-esophageal reflux disease without esophagitis: Secondary | ICD-10-CM | POA: Diagnosis not present

## 2020-08-21 DIAGNOSIS — J431 Panlobular emphysema: Secondary | ICD-10-CM | POA: Diagnosis not present

## 2020-08-21 DIAGNOSIS — Z23 Encounter for immunization: Secondary | ICD-10-CM | POA: Diagnosis not present

## 2020-08-29 DIAGNOSIS — H6121 Impacted cerumen, right ear: Secondary | ICD-10-CM | POA: Diagnosis not present

## 2020-08-29 DIAGNOSIS — H938X2 Other specified disorders of left ear: Secondary | ICD-10-CM | POA: Diagnosis not present

## 2020-08-29 DIAGNOSIS — H903 Sensorineural hearing loss, bilateral: Secondary | ICD-10-CM | POA: Diagnosis not present

## 2020-08-29 DIAGNOSIS — H60541 Acute eczematoid otitis externa, right ear: Secondary | ICD-10-CM | POA: Diagnosis not present

## 2020-09-12 ENCOUNTER — Telehealth: Payer: Self-pay | Admitting: Internal Medicine

## 2020-09-12 NOTE — Telephone Encounter (Signed)
Called [facility] as requested and had to [facility]

## 2020-09-12 NOTE — Telephone Encounter (Signed)
May call daughter Sehaj Mcenroe at [phone] for the recommendations that Cristela Blue called about.

## 2020-09-13 NOTE — Telephone Encounter (Signed)
Spoke with Nevin Bloodgood, pt's daughter. She states that pt's oxygen runs 88% at rest on O2 4 L and sometimes drops to 70's when ambulating on 4L. Due to oxygen shortage they can only get a limited amount of tanks so they use his POC  On 6L when pt is riding in the car and make sure that his sats stay at least 88. They want to make sure this is ok. Daughter states that pt has appt with Dr young on Monday 09/17/20 and they will discuss with him at this time. Nothing further needed.

## 2020-09-13 NOTE — Telephone Encounter (Signed)
Pt's daughter calling back from yesterday [name] - CB# [phone]

## 2020-09-17 ENCOUNTER — Other Ambulatory Visit: Payer: Self-pay

## 2020-09-17 ENCOUNTER — Encounter: Payer: Self-pay | Admitting: Internal Medicine

## 2020-09-17 ENCOUNTER — Ambulatory Visit: Payer: PPO | Admitting: Internal Medicine

## 2020-09-17 VITALS — BP 96/50 | HR 63 | Temp 95.4°F | Ht 66.0 in | Wt 138.0 lb

## 2020-09-17 DIAGNOSIS — J9611 Chronic respiratory failure with hypoxia: Secondary | ICD-10-CM

## 2020-09-17 DIAGNOSIS — J449 Chronic obstructive pulmonary disease, unspecified: Secondary | ICD-10-CM | POA: Diagnosis not present

## 2020-09-17 MED ORDER — LEVALBUTEROL HCL 0.63 MG/3ML IN NEBU: 0.6300 mg | INHALATION_SOLUTION | Freq: Four times a day (QID) | RESPIRATORY_TRACT | 6 refills | Status: AC | PRN

## 2020-09-17 NOTE — Patient Instructions (Signed)
I agree with Hospice and I'm glad they are helping.  Ok to run O2 at home on 5 L.     When away from house, you can use the portable concentrator at 6l   I agree it makes sense to wait on the [facility] surgery and watch through the winter.  Ok to use either the nebulizer machine with levalbuterol 0.63, up to 3 times daily, or to use Trelegy inhaler. You can change from day to day and see how you do.  Please call if we can help

## 2020-09-17 NOTE — Progress Notes (Signed)
Patient ID: [name], male    DOB: 03/31/1927, 86 y.o.   MRN: [id number] HPI male former smoker followed for asthma/COPD, chronic hypoxic resp failure,, UIP, allergic rhinitis, complicated by GERD Walk test 08/01/2019-  pt dropped 86% on O2 2L HR 110, resolved to 90% on 3L O2, HR 83 ------------------------------------------------  12/15/19- Virtual Visit via Telephone Not  History of Present Illness:  86 year old male former smoker followed for asthma/COPD, UIP, allergic rhinitis, complicated by GERD, glaucoma O2 2 L sleep Advanced Advair 250  f/u Chronic respiratory failure/ILD.SOB. O2 2l Cont.  Daughter on phone to assist with call. No problems reported. Meds remain stable and satisfactory. Continuous O2. No acute changes.  Observations/Objective: CXR 08/01/2019- Increased reticulonodular opacity in the right mid lung, favored to represent lobar pneumonia superimposed on interstitial fibrosis, less likely progression of chronic disease.  Assessment and Plan: COPD mixed type- currently stable Chronic hypoxic resp fail- depends on O2 UIP- He has chosen not to treat this- appropriate  Follow Up Instructions: 6 months  09/17/20-  86 year old male former smoker followed for asthma/COPD, UIP, allergic rhinitis, complicated by GERD, glaucoma O2 2-4 L sleep Adapt     POC 6L in car Advair 250 Covid vax- 3 Phizer Flu vax- had Seen by NP in August and referred to Hospice Wife died. Daughter remains very supportive. He is limited by deafness, frailty They ask me to agree with their choice not to go through Mohs surgery for a lesion on R neck at this time.The dermatologist apparently supported this choice, but some family want him to do it. He wants to wait and watch for now.  DOE slowly increased over summer. He mostly sits now. Hospice nurse asks ok to increase O2 to 5L at home. CTa chest 02/27/20- IMPRESSION: Extensive advanced centrilobular emphysematous changes with underlying  diffuse ground-glass opacities and interstitial thickening at both lung bases which could be due to concomitant inflammatory/infectious etiology. No central, segmental, or subsegmental pulmonary embolism. Findings suggestive of pulmonary arterial hypertension. Aortic Atherosclerosis (ICD10-I70.0). CXR 03/22/20-  IMPRESSION: 1. Questionable increase in the opacity in the left lung base which could reflect some atelectatic change or superimposed consolidation, difficult to fully assess given the advanced scarring and fibrotic changes in both lungs.   ROS-see HPI  + = positive Constitutional:   No-   weight loss, night sweats, fevers, chills,  +fatigue, lassitude. HEENT:   No-  headaches, difficulty swallowing, tooth/dental problems, sore throat,       No-  sneezing, itching, ear ache, nasal congestion, post nasal drip,  CV:  No-   chest pain, orthopnea, PND, swelling in lower extremities, anasarca, dizziness, palpitations Resp: + shortness of breath with exertion or at rest.              No- productive cough,   +non-productive cough,  No- coughing up of blood.              No-   change in color of mucus.  No- wheezing.   Skin: No-   rash or lesions. GI:  No-   heartburn, indigestion, abdominal pain, nausea, vomiting,  GU: MS:  No-   joint pain or swelling.   Neuro-     nothing unusual Psych:  No- change in mood or affect. No depression or anxiety.  ? memory loss.  Objective:   OBJ- Physical Exam General- Alert, Oriented, Affect-appropriate, Distress- none acute, + O2 5L tank Skin- + crusting 1/2 cm lesion lateral R neck- discussed-  identified as cancer by Dermatology.  Lymphadenopathy- none Head- atraumatic            Eyes-   conjunctivae and secretions clear            Ears- +HOH-bilateral hearing aids            Nose- Clear, no-Septal dev, mucus, polyps, erosion, perforation             Throat- Mallampati II , mucosa clear , drainage- none, tonsils- atrophic Neck- flexible ,  trachea midline, no stridor , thyroid nl, carotid no bruit Chest - symmetrical excursion , unlabored           Heart/CV- +RRR faint , 2/6+ murmur , no gallop  , no rub, nl s1 s2                           - JVD -none, edema- none, stasis changes- none, varices- none           Lung-  +unlabored on room air , wheeze-none, cough-none ,                            dullness-none, rub- none, + coarse crackles in bases           Chest wall-  Abd-  Br/ Gen/ Rectal- Not done, not indicated Extrem- cyanosis- none, clubbing- none, atrophy- none, strength- nl Neuro- grossly intact to observation

## 2020-09-18 ENCOUNTER — Telehealth: Payer: Self-pay | Admitting: Internal Medicine

## 2020-09-18 NOTE — Telephone Encounter (Signed)
Spoke with [facility]  Was asked to fax over ov note from 09/17/20  I have faxed it to [phone]  Nothing further needed

## 2020-09-22 NOTE — Assessment & Plan Note (Signed)
Oxygen need for comfort and to support sat > 88%, now usually needs 4-5L

## 2020-09-22 NOTE — Assessment & Plan Note (Signed)
Natural course of disease- slow progression. Plan- ok to use the xopenex neb solution they have at home, and refill sent. Likely to be easier for him now than an inhaler

## 2020-10-11 DIAGNOSIS — H6122 Impacted cerumen, left ear: Secondary | ICD-10-CM | POA: Diagnosis not present

## 2020-10-11 DIAGNOSIS — Z974 Presence of external hearing-aid: Secondary | ICD-10-CM | POA: Diagnosis not present

## 2020-10-11 DIAGNOSIS — H903 Sensorineural hearing loss, bilateral: Secondary | ICD-10-CM | POA: Diagnosis not present

## 2020-10-11 DIAGNOSIS — H6121 Impacted cerumen, right ear: Secondary | ICD-10-CM | POA: Diagnosis not present

## 2020-10-11 DIAGNOSIS — H6123 Impacted cerumen, bilateral: Secondary | ICD-10-CM | POA: Diagnosis not present

## 2020-10-11 DIAGNOSIS — H60541 Acute eczematoid otitis externa, right ear: Secondary | ICD-10-CM | POA: Diagnosis not present

## 2020-10-18 ENCOUNTER — Telehealth: Payer: Self-pay | Admitting: Internal Medicine

## 2020-10-18 NOTE — Telephone Encounter (Signed)
Attempted to call Maura with [facility] but unable to reach. Left message for her to return call.

## 2020-10-19 NOTE — Telephone Encounter (Signed)
Please ask Hospice RN to titrate O2 to keep sats 88% or better, with my thanks.

## 2020-10-19 NOTE — Telephone Encounter (Signed)
[name] returning call.  [phone].  Available for the next half hour.  Trying to get new parameters for increased oxygen demand.  See earlier note.

## 2020-10-19 NOTE — Telephone Encounter (Signed)
Lm x2 for Maura with [facility].

## 2020-10-19 NOTE — Telephone Encounter (Signed)
Dr. Annamaria Boots please advise if RN for hospice can titrate oxygen to keep sats above 88% or can you give parameters for oxygen while patient is at home.    Dr. Annamaria Boots please advise.

## 2020-10-19 NOTE — Telephone Encounter (Signed)
Spoke with Cristela Blue, pt's hospice nurse  Advised her of Dr Janee Morn response She verbalized understanding and nothing further needed

## 2020-10-23 ENCOUNTER — Ambulatory Visit: Payer: PPO | Admitting: Cardiology

## 2020-10-24 ENCOUNTER — Ambulatory Visit: Payer: PPO | Admitting: Cardiology

## 2020-11-15 DIAGNOSIS — H6121 Impacted cerumen, right ear: Secondary | ICD-10-CM | POA: Diagnosis not present

## 2020-11-15 DIAGNOSIS — H608X1 Other otitis externa, right ear: Secondary | ICD-10-CM | POA: Diagnosis not present

## 2020-11-15 DIAGNOSIS — Z974 Presence of external hearing-aid: Secondary | ICD-10-CM | POA: Diagnosis not present

## 2020-11-22 ENCOUNTER — Telehealth: Payer: Self-pay | Admitting: Internal Medicine

## 2020-11-22 MED ORDER — TRELEGY ELLIPTA 100-62.5-25 MCG/INH IN AEPB: 1.0000 | INHALATION_SPRAY | Freq: Every day | RESPIRATORY_TRACT | 0 refills | Status: AC

## 2020-11-22 NOTE — Telephone Encounter (Signed)
Yes we can put samples up front if we have them x 2 .

## 2020-11-22 NOTE — Telephone Encounter (Signed)
Samples placed up front for pickup.  Daughter aware.  Nothing further needed at this time- will close encounter.

## 2020-12-12 ENCOUNTER — Telehealth: Payer: Self-pay | Admitting: Internal Medicine

## 2020-12-12 NOTE — Telephone Encounter (Signed)
Ok to use held spot 

## 2020-12-12 NOTE — Telephone Encounter (Signed)
Spoke with Gunnar Fusi and scheduled appt with CDY for 4:30 pm tomorrow.

## 2020-12-12 NOTE — Telephone Encounter (Signed)
Spoke with the pt's daughter   Offered appt for televisit for 12/17/20  She states she can only do Wed or Thurs and wants to know if pt can be worked in  Can we use a held slot? Thanks!

## 2020-12-12 NOTE — Telephone Encounter (Signed)
Request televisit with Dr. Maple Hudson  To discuss current hospice plan

## 2020-12-12 NOTE — Telephone Encounter (Signed)
Fine to set up televist with [name]/ daughter to review his care plan on Hospice.

## 2020-12-13 ENCOUNTER — Other Ambulatory Visit: Payer: Self-pay

## 2020-12-13 ENCOUNTER — Ambulatory Visit (INDEPENDENT_AMBULATORY_CARE_PROVIDER_SITE_OTHER): Payer: PPO | Admitting: Internal Medicine

## 2020-12-13 ENCOUNTER — Encounter: Payer: Self-pay | Admitting: Internal Medicine

## 2020-12-13 DIAGNOSIS — J9611 Chronic respiratory failure with hypoxia: Secondary | ICD-10-CM

## 2020-12-13 DIAGNOSIS — J449 Chronic obstructive pulmonary disease, unspecified: Secondary | ICD-10-CM

## 2020-12-13 MED ORDER — DIAZEPAM 2 MG PO TABS: 2.0000 mg | ORAL_TABLET | Freq: Four times a day (QID) | ORAL | 2 refills | Status: AC | PRN

## 2020-12-13 NOTE — Patient Instructions (Signed)
We agreed to try to keep oxygen score around 90- somewhere between 88 and 93, as much as possible.  Ask Hospice if they can cover Breo or Advair as a maintenance inhaler.  Script sent for diazepam (Valium) 2 mg.  Use 1/2 or 1 tab up to every 6 hours, if needed for anxiety.  Please call if we can help

## 2020-12-13 NOTE — Progress Notes (Signed)
Patient ID: [name], male    DOB: January 10, 1927, 87 y.o.   MRN: [id number] HPI male former smoker followed for asthma/COPD, chronic hypoxic resp failure,, UIP, allergic rhinitis, complicated by GERD Walk test 08/01/2019-  pt dropped 86% on O2 2L HR 110, resolved to 90% on 3L O2, HR 83 ------------------------------------------------  09/17/20-  87 year old male former smoker followed for asthma/COPD, UIP, allergic rhinitis, complicated by GERD, glaucoma O2 2-4 L sleep Adapt     POC 6L in car Advair 250 Covid vax- 3 Phizer Flu vax- had Seen by NP in August and referred to Hospice Wife died. Daughter remains very supportive. He is limited by deafness, frailty They ask me to agree with their choice not to go through Mohs surgery for a lesion on R neck at this time.The dermatologist apparently supported this choice, but some family want him to do it. He wants to wait and watch for now.  DOE slowly increased over summer. He mostly sits now. Hospice nurse asks ok to increase O2 to 5L at home. CTa chest 02/27/20- IMPRESSION: Extensive advanced centrilobular emphysematous changes with underlying diffuse ground-glass opacities and interstitial thickening at both lung bases which could be due to concomitant inflammatory/infectious etiology. No central, segmental, or subsegmental pulmonary embolism. Findings suggestive of pulmonary arterial hypertension. Aortic Atherosclerosis (ICD10-I70.0). CXR 03/22/20-  IMPRESSION: 1. Questionable increase in the opacity in the left lung base which could reflect some atelectatic change or superimposed consolidation, difficult to fully assess given the advanced scarring and fibrotic changes in both lungs.    12/13/20- Virtual Visit via Telephone Note  I connected with [name] on 12/13/20 at  4:30 PM EST by telephone and verified that I am speaking with the correct person using two identifiers. Now with Hospice and Hospice nurse recommended televist  to review plan of care. Location: Patient: H Provider: O  I discussed the limitations, risks, security and privacy concerns of performing an evaluation and management service by telephone and the availability of in person appointments. I also discussed with the patient that there may be a patient responsible charge related to this service. The patient expressed understanding and agreed to proceed.  History of Present Illness: 87 year old male former smoker followed for asthma/COPD ,Chronic Respiratory Failure with Hypoxia,  UIP, allergic rhinitis, complicated by GERD, glaucoma, BPH,  O2 2-4 L sleep Adapt     POC 6L in car     Daughter on speaker phone with him. Trelegy 100, Neb Xop Covid vax- 3 Phizer Flu vax- had Pneumonia before Christmas, gradually better. Now with Hospice, and Hospice nurse asked them to get in touch to review care plan.  Trelegy is not on Hospice formulary- they will check to see if Hospice covers Breo or Advair. He has been using nebulizer instead, but can't see well enough to manage it by himself. We discussed oxygen and suggested goal range 88-93%.  Daugher and Hospice asking for some help with his anxiety. We discussed trial diazepam.    Observations/Objective: CXR 03/22/20- Questionable increase in the opacity in the left lung base which could reflect some atelectatic change or superimposed consolidation, difficult to fully assess given the advanced scarring and fibrotic changes in both lungs. He was on speaker phone with clear voice, sounding in no distress.   Assessment and Plan: Asthma-COPD overlap Chronic Respiratory Failure with Hypoxia Hospice status- appropriate.   Follow Up Instructions:    I discussed the assessment and treatment plan with the patient. The patient was  provided an opportunity to ask questions and all were answered. The patient agreed with the plan and demonstrated an understanding of the instructions.   The patient was advised to  call back or seek an in-person evaluation if the symptoms worsen or if the condition fails to improve as anticipated.  I provided 18 minutes of non-face-to-face time during this encounter.   [name], MD     ROS-see HPI  + = positive Constitutional:   No-   weight loss, night sweats, fevers, chills,  +fatigue, lassitude. HEENT:   No-  headaches, difficulty swallowing, tooth/dental problems, sore throat,       No-  sneezing, itching, ear ache, nasal congestion, post nasal drip,  CV:  No-   chest pain, orthopnea, PND, swelling in lower extremities, anasarca, dizziness, palpitations Resp: + shortness of breath with exertion or at rest.              No- productive cough,   +non-productive cough,  No- coughing up of blood.              No-   change in color of mucus.  No- wheezing.   Skin: No-   rash or lesions. GI:  No-   heartburn, indigestion, abdominal pain, nausea, vomiting,  GU: MS:  No-   joint pain or swelling.   Neuro-     nothing unusual Psych:  No- change in mood or affect. No depression or anxiety.  ? memory loss.  Objective:   OBJ- Physical Exam General- Alert, Oriented, Affect-appropriate, Distress- none acute, + O2 5L tank Skin- + crusting 1/2 cm lesion lateral R neck- discussed- identified as cancer by Dermatology.  Lymphadenopathy- none Head- atraumatic            Eyes-   conjunctivae and secretions clear            Ears- +HOH-bilateral hearing aids            Nose- Clear, no-Septal dev, mucus, polyps, erosion, perforation             Throat- Mallampati II , mucosa clear , drainage- none, tonsils- atrophic Neck- flexible , trachea midline, no stridor , thyroid nl, carotid no bruit Chest - symmetrical excursion , unlabored           Heart/CV- +RRR faint , 2/6+ murmur , no gallop  , no rub, nl s1 s2                           - JVD -none, edema- none, stasis changes- none, varices- none           Lung-  +unlabored on room air , wheeze-none, cough-none ,                             dullness-none, rub- none, + coarse crackles in bases           Chest wall-  Abd-  Br/ Gen/ Rectal- Not done, not indicated Extrem- cyanosis- none, clubbing- none, atrophy- none, strength- nl Neuro- grossly intact to observation

## 2020-12-14 ENCOUNTER — Encounter: Payer: Self-pay | Admitting: Internal Medicine

## 2020-12-14 NOTE — Assessment & Plan Note (Addendum)
Asthma- COPD overlap. Reactive airways component is fairly well controlled now . He can't manage neb due to eyesight.  Plan- See if Hospice formulary could handle either Breo or Advair instead of Trelegy.

## 2020-12-14 NOTE — Assessment & Plan Note (Signed)
He remains O2 dependent. Plan- suggested target saturation range 88-93% as discussed

## 2020-12-17 ENCOUNTER — Ambulatory Visit: Payer: PPO | Admitting: Internal Medicine

## 2021-01-08 DEATH — deceased

## 2021-01-09 ENCOUNTER — Telehealth: Payer: Self-pay | Admitting: Internal Medicine

## 2021-01-09 NOTE — Telephone Encounter (Signed)
Attempted to call pt's daughter Robenson but unable to reach. Left [facility]ick a detailed message and also expressed condolences to her about pt's passing.  Routing to Dr. Annamaria Boots.

## 2021-03-13 ENCOUNTER — Ambulatory Visit: Payer: PPO | Admitting: Internal Medicine

## 2021-03-21 ENCOUNTER — Ambulatory Visit: Payer: PPO | Admitting: Internal Medicine

## 2021-07-15 IMAGING — CT CT ANGIO CHEST
2 of 6 series · 18 of 46 positions shown · IV contrast (Omnipaque or Isovue)
Comparison: Radiograph same day

CLINICAL DATA: Shortness of breath and low heart rate

EXAM:
CT ANGIOGRAPHY CHEST WITH CONTRAST
TECHNIQUE: Multidetector CT imaging of the chest was performed using the
standard protocol during bolus administration of intravenous
contrast. Multiplanar CT image reconstructions and MIPs were
obtained to evaluate the vascular anatomy.
CONTRAST:  100mL OMNIPAQUE IOHEXOL 350 MG/ML SOLN

[Series 5: pe axial thins · axial · 0.71mm/px · z∈[-132,+88]mm · 15 of 242 slices shown]
[im 11/242  lung]
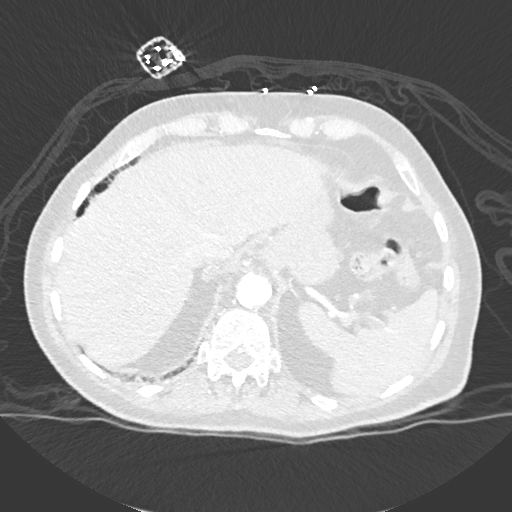
[im 32/242  soft-tissue]
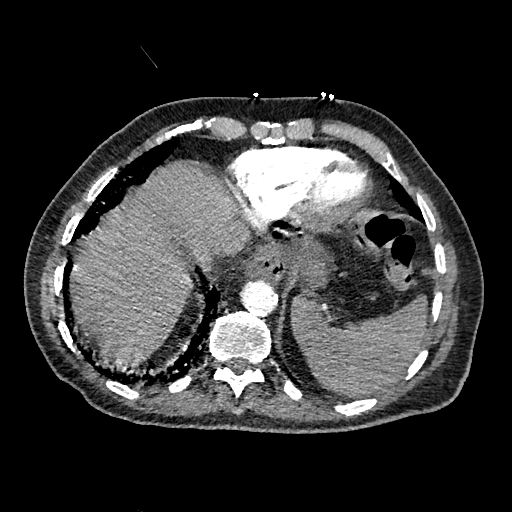
[im 42/242  lung]
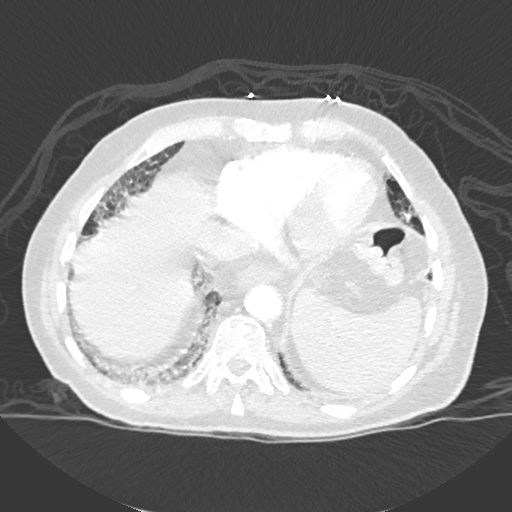
[im 63/242  soft-tissue]
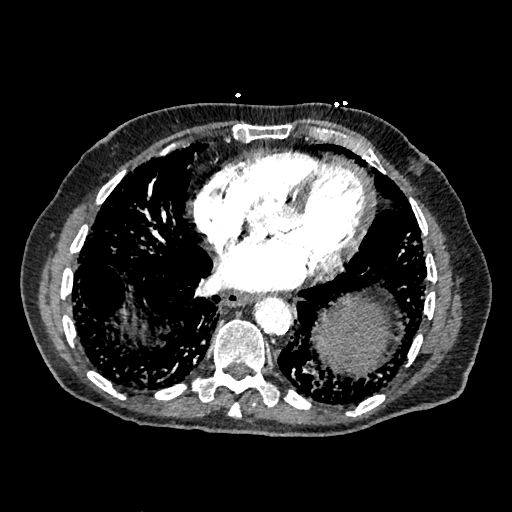
[im 74/242  lung]
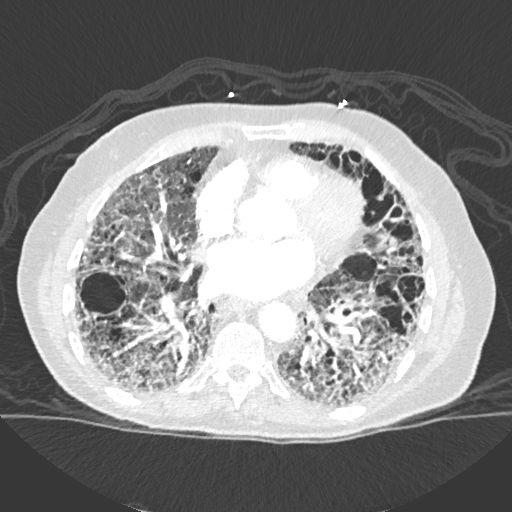
[im 95/242  soft-tissue]
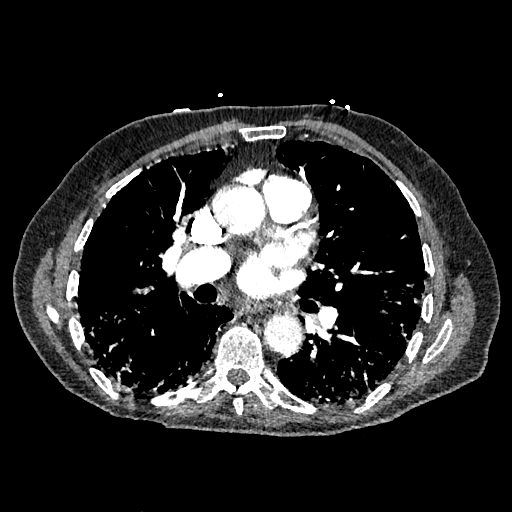
[im 105/242  lung]
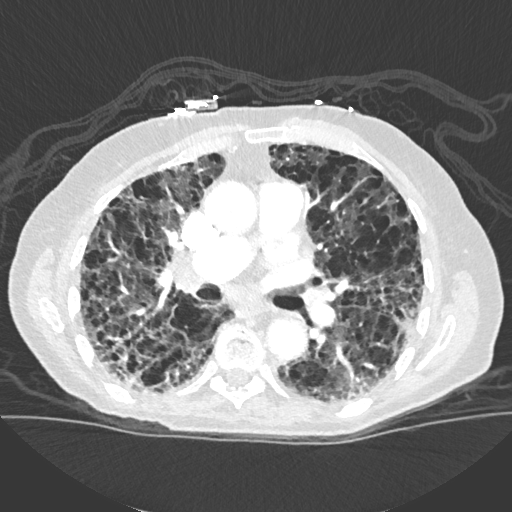
[im 126/242  soft-tissue]
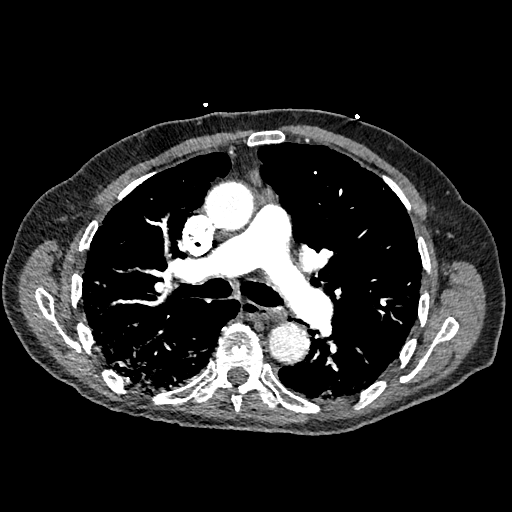
[im 137/242  lung]
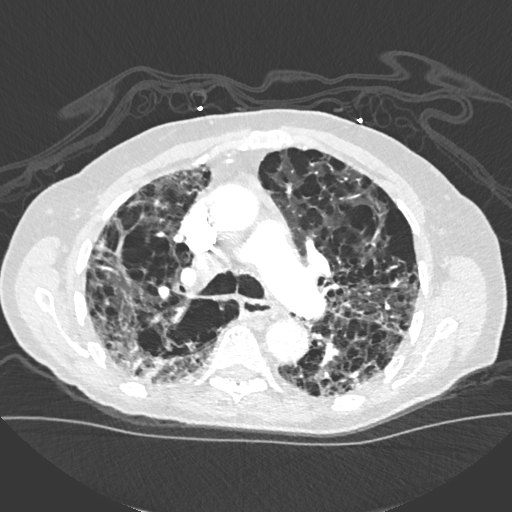
[im 147/242  soft-tissue]
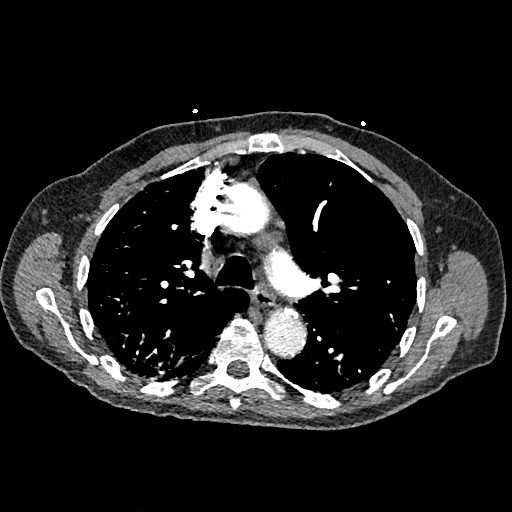
[im 168/242  lung]
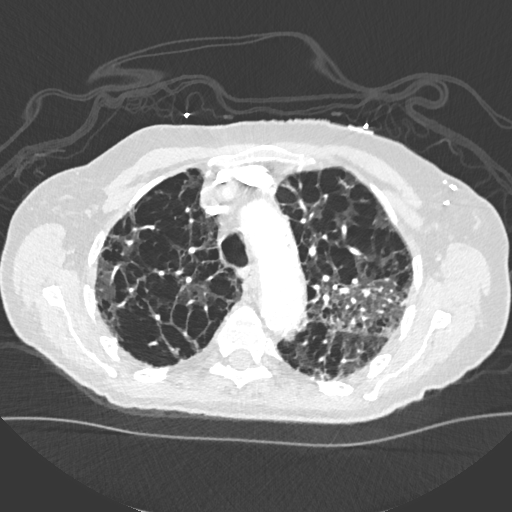
[im 179/242  soft-tissue]
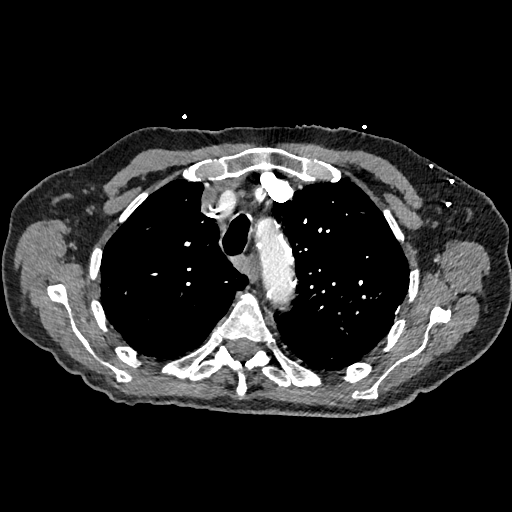
[im 200/242  lung]
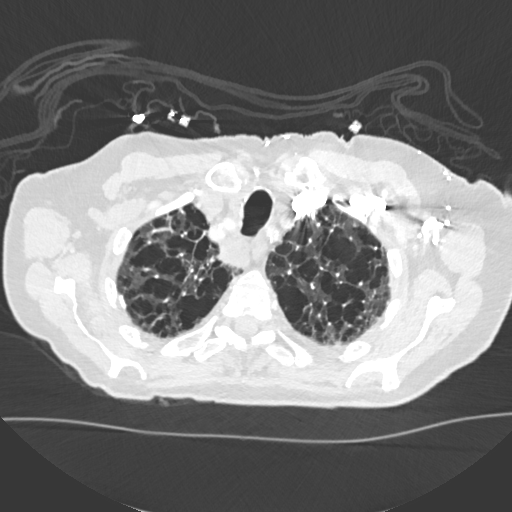
[im 210/242  soft-tissue]
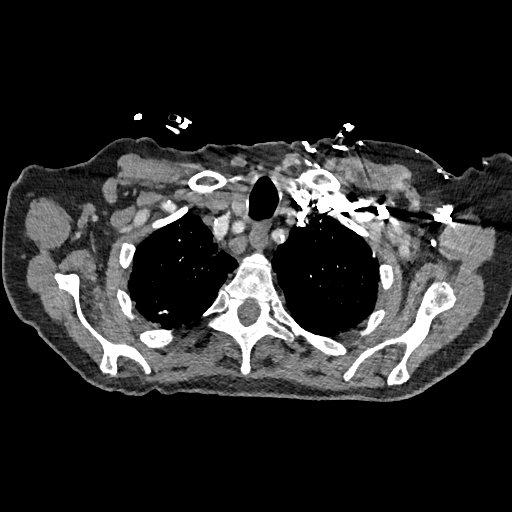
[im 231/242  lung]
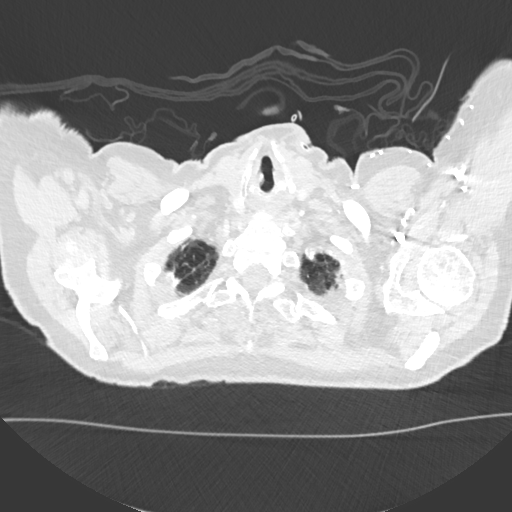

[Series 8: cor soft · coronal · 0.51mm/px · 3 of 117 slices shown]
[im 30/117  soft-tissue]
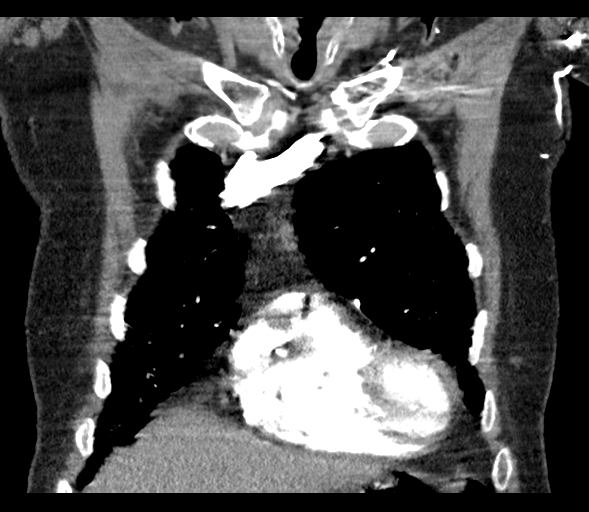
[im 59/117  soft-tissue]
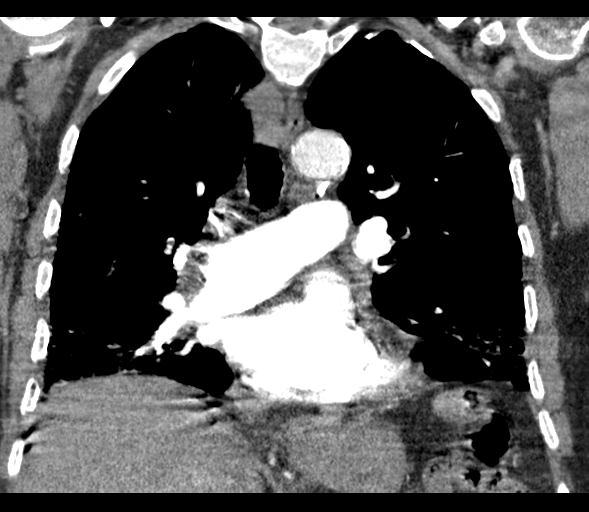
[im 88/117  soft-tissue]
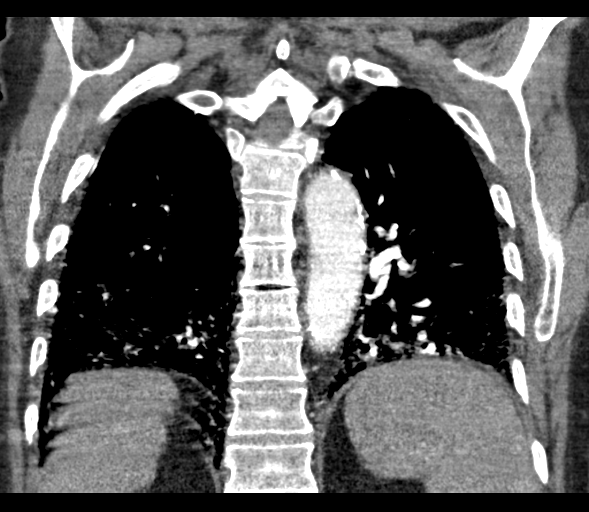

[18 of 46 positions shown; findings below may reference images not displayed]

FINDINGS: Cardiovascular: There is a optimal opacification of the pulmonary
arteries. There is no central,segmental, or subsegmental filling
defects within the pulmonary arteries. There is prominence of the
main pulmonary arteries measuring up to 2.8 cm, likely due to
pulmonary arterial hypertension. There is mild cardiomegaly.
Coronary artery calcifications are seen. No pericardial effusion or
thickening. No evidence right heart strain. There is normal
three-vessel brachiocephalic anatomy without proximal stenosis.
Scattered aortic atherosclerosis. There is a mild amount of
atherosclerosis seen at the origin of the left subclavian artery.

Mediastinum/Nodes: No hilar, mediastinal, or axillary adenopathy.
Thyroid gland, trachea, and esophagus demonstrate no significant
findings.

Lungs/Pleura: Extensive centrilobular emphysematous changes with
subpleural bleb formation is again identified. There is diffuse
areas of ground-glass opacity and interstitial thickening seen
predominantly at both lung bases. No pleural effusion is seen. No
pneumothorax.

Upper Abdomen: No acute abnormalities present in the visualized
portions of the upper abdomen.

Musculoskeletal: No chest wall abnormality. No acute or significant
osseous findings.

Review of the MIP images confirms the above findings.
IMPRESSION: Extensive advanced centrilobular emphysematous changes with
underlying diffuse ground-glass opacities and interstitial
thickening at both lung bases which could be due to concomitant
inflammatory/infectious etiology.

No central, segmental, or subsegmental pulmonary embolism.

Findings suggestive of pulmonary arterial hypertension.

Aortic Atherosclerosis ([id number]).
